# Patient Record
Sex: Male | Born: 1975 | Race: Black or African American | Hispanic: No | Marital: Married | Smoking: Never smoker
Health system: Southern US, Community
[De-identification: ages and names within clinical notes are randomized; demographics above are authoritative.]

## PROBLEM LIST (undated history)

## (undated) DIAGNOSIS — E119 Type 2 diabetes mellitus without complications: Secondary | ICD-10-CM

## (undated) DIAGNOSIS — D571 Sickle-cell disease without crisis: Secondary | ICD-10-CM

## (undated) HISTORY — PX: CHOLECYSTECTOMY: SHX55

---

## 1998-01-11 ENCOUNTER — Emergency Department (HOSPITAL_COMMUNITY): Admission: EM | Admit: 1998-01-11 | Discharge: 1998-01-11 | Payer: Self-pay | Admitting: Emergency Medicine

## 1998-02-20 ENCOUNTER — Inpatient Hospital Stay (HOSPITAL_COMMUNITY): Admission: EM | Admit: 1998-02-20 | Discharge: 1998-02-23 | Payer: Self-pay | Admitting: Family Medicine

## 1998-02-21 ENCOUNTER — Encounter: Payer: Self-pay | Admitting: Family Medicine

## 1999-02-24 ENCOUNTER — Emergency Department (HOSPITAL_COMMUNITY): Admission: EM | Admit: 1999-02-24 | Discharge: 1999-02-24 | Payer: Self-pay | Admitting: Emergency Medicine

## 1999-07-19 ENCOUNTER — Emergency Department (HOSPITAL_COMMUNITY): Admission: EM | Admit: 1999-07-19 | Discharge: 1999-07-19 | Payer: Self-pay | Admitting: Emergency Medicine

## 2000-04-02 ENCOUNTER — Inpatient Hospital Stay (HOSPITAL_COMMUNITY): Admission: AD | Admit: 2000-04-02 | Discharge: 2000-04-06 | Payer: Self-pay | Admitting: Family Medicine

## 2000-04-02 ENCOUNTER — Encounter: Payer: Self-pay | Admitting: Family Medicine

## 2000-04-04 ENCOUNTER — Encounter: Payer: Self-pay | Admitting: Family Medicine

## 2000-04-10 ENCOUNTER — Emergency Department (HOSPITAL_COMMUNITY): Admission: EM | Admit: 2000-04-10 | Discharge: 2000-04-10 | Payer: Self-pay | Admitting: Emergency Medicine

## 2001-02-11 ENCOUNTER — Inpatient Hospital Stay (HOSPITAL_COMMUNITY): Admission: EM | Admit: 2001-02-11 | Discharge: 2001-02-13 | Payer: Self-pay | Admitting: Emergency Medicine

## 2001-04-16 ENCOUNTER — Emergency Department (HOSPITAL_COMMUNITY): Admission: EM | Admit: 2001-04-16 | Discharge: 2001-04-16 | Payer: Self-pay | Admitting: Emergency Medicine

## 2001-10-20 ENCOUNTER — Encounter: Payer: Self-pay | Admitting: Emergency Medicine

## 2001-10-20 ENCOUNTER — Emergency Department (HOSPITAL_COMMUNITY): Admission: EM | Admit: 2001-10-20 | Discharge: 2001-10-21 | Payer: Self-pay | Admitting: Emergency Medicine

## 2001-11-17 ENCOUNTER — Inpatient Hospital Stay (HOSPITAL_COMMUNITY): Admission: EM | Admit: 2001-11-17 | Discharge: 2001-11-20 | Payer: Self-pay | Admitting: Emergency Medicine

## 2001-11-20 ENCOUNTER — Encounter: Payer: Self-pay | Admitting: Family Medicine

## 2001-11-29 ENCOUNTER — Emergency Department (HOSPITAL_COMMUNITY): Admission: EM | Admit: 2001-11-29 | Discharge: 2001-11-29 | Payer: Self-pay | Admitting: Emergency Medicine

## 2001-11-29 ENCOUNTER — Encounter: Payer: Self-pay | Admitting: Emergency Medicine

## 2002-12-14 ENCOUNTER — Emergency Department (HOSPITAL_COMMUNITY): Admission: EM | Admit: 2002-12-14 | Discharge: 2002-12-14 | Payer: Self-pay | Admitting: Emergency Medicine

## 2003-04-11 ENCOUNTER — Inpatient Hospital Stay (HOSPITAL_COMMUNITY): Admission: EM | Admit: 2003-04-11 | Discharge: 2003-04-16 | Payer: Self-pay | Admitting: Emergency Medicine

## 2003-08-31 ENCOUNTER — Emergency Department (HOSPITAL_COMMUNITY): Admission: EM | Admit: 2003-08-31 | Discharge: 2003-08-31 | Payer: Self-pay | Admitting: Emergency Medicine

## 2004-07-13 ENCOUNTER — Emergency Department (HOSPITAL_COMMUNITY): Admission: EM | Admit: 2004-07-13 | Discharge: 2004-07-13 | Payer: Self-pay | Admitting: Emergency Medicine

## 2004-08-09 ENCOUNTER — Observation Stay (HOSPITAL_COMMUNITY): Admission: EM | Admit: 2004-08-09 | Discharge: 2004-08-10 | Payer: Self-pay | Admitting: Emergency Medicine

## 2004-09-27 ENCOUNTER — Emergency Department (HOSPITAL_COMMUNITY): Admission: EM | Admit: 2004-09-27 | Discharge: 2004-09-27 | Payer: Self-pay | Admitting: Emergency Medicine

## 2004-12-10 ENCOUNTER — Inpatient Hospital Stay (HOSPITAL_COMMUNITY): Admission: EM | Admit: 2004-12-10 | Discharge: 2004-12-11 | Payer: Self-pay | Admitting: Emergency Medicine

## 2005-07-12 ENCOUNTER — Emergency Department (HOSPITAL_COMMUNITY): Admission: EM | Admit: 2005-07-12 | Discharge: 2005-07-12 | Payer: Self-pay | Admitting: Emergency Medicine

## 2005-12-20 ENCOUNTER — Emergency Department (HOSPITAL_COMMUNITY): Admission: EM | Admit: 2005-12-20 | Discharge: 2005-12-21 | Payer: Self-pay | Admitting: Emergency Medicine

## 2006-06-17 ENCOUNTER — Inpatient Hospital Stay (HOSPITAL_COMMUNITY): Admission: EM | Admit: 2006-06-17 | Discharge: 2006-06-18 | Payer: Self-pay | Admitting: Emergency Medicine

## 2007-06-01 ENCOUNTER — Emergency Department (HOSPITAL_COMMUNITY): Admission: EM | Admit: 2007-06-01 | Discharge: 2007-06-01 | Payer: Self-pay | Admitting: Emergency Medicine

## 2008-03-17 ENCOUNTER — Emergency Department (HOSPITAL_COMMUNITY): Admission: EM | Admit: 2008-03-17 | Discharge: 2008-03-17 | Payer: Self-pay | Admitting: Emergency Medicine

## 2008-06-05 ENCOUNTER — Emergency Department (HOSPITAL_COMMUNITY): Admission: EM | Admit: 2008-06-05 | Discharge: 2008-06-05 | Payer: Self-pay | Admitting: Emergency Medicine

## 2008-06-06 ENCOUNTER — Emergency Department (HOSPITAL_COMMUNITY): Admission: EM | Admit: 2008-06-06 | Discharge: 2008-06-06 | Payer: Self-pay | Admitting: Emergency Medicine

## 2008-12-23 ENCOUNTER — Emergency Department (HOSPITAL_COMMUNITY): Admission: EM | Admit: 2008-12-23 | Discharge: 2008-12-23 | Payer: Self-pay | Admitting: Emergency Medicine

## 2010-05-01 LAB — COMPREHENSIVE METABOLIC PANEL
Alkaline Phosphatase: 95 U/L (ref 39–117)
BUN: 8 mg/dL (ref 6–23)
Calcium: 9.7 mg/dL (ref 8.4–10.5)
Chloride: 103 mEq/L (ref 96–112)
GFR calc Af Amer: >60 mL/min (ref >60)
Glucose, Bld: 94 mg/dL (ref 70–99)
Total Bilirubin: 3.8 mg/dL — ABNORMAL HIGH (ref 0.3–1.2)

## 2010-05-01 LAB — URINALYSIS, ROUTINE W REFLEX MICROSCOPIC
Bilirubin Urine: NEGATIVE
Nitrite: NEGATIVE

## 2010-05-01 LAB — DIFFERENTIAL
Eosinophils Absolute: 0.2 10*3/uL (ref 0.0–0.7)
Eosinophils Relative: 1 % (ref 0–5)
Lymphs Abs: 4.4 10*3/uL — ABNORMAL HIGH (ref 0.7–4.0)
Monocytes Absolute: 1.9 10*3/uL — ABNORMAL HIGH (ref 0.1–1.0)
Monocytes Relative: 8 % (ref 3–12)
Neutro Abs: 16.7 10*3/uL — ABNORMAL HIGH (ref 1.7–7.7)
Neutrophils Relative %: 71 % (ref 43–77)

## 2010-05-01 LAB — CBC
MCHC: 34.2 g/dL (ref 30.0–36.0)
Platelets: 322 10*3/uL (ref 150–400)
RBC: 4.28 MIL/uL (ref 4.22–5.81)
RDW: 17.4 % — ABNORMAL HIGH (ref 11.5–15.5)

## 2010-05-01 LAB — RETICULOCYTES: Retic Ct Pct: 6.4 % — ABNORMAL HIGH (ref 0.4–3.1)

## 2010-05-07 LAB — URINALYSIS, ROUTINE W REFLEX MICROSCOPIC
Hgb urine dipstick: NEGATIVE
Ketones, ur: NEGATIVE mg/dL
Protein, ur: NEGATIVE mg/dL
Specific Gravity, Urine: 1.012 (ref 1.005–1.030)
pH: 6.5 (ref 5.0–8.0)

## 2010-05-07 LAB — POCT I-STAT, CHEM 8
BUN: 8 mg/dL (ref 6–23)
Creatinine, Ser: 0.8 mg/dL (ref 0.4–1.5)
HCT: 41 % (ref 39.0–52.0)
Hemoglobin: 13.9 g/dL (ref 13.0–17.0)
Sodium: 140 mEq/L (ref 135–145)

## 2010-05-07 LAB — DIFFERENTIAL
Basophils Absolute: 0 10*3/uL (ref 0.0–0.1)
Basophils Relative: 1 % (ref 0–1)
Eosinophils Absolute: 0.3 10*3/uL (ref 0.0–0.7)
Eosinophils Relative: 1 % (ref 0–5)
Eosinophils Relative: 1 % (ref 0–5)
Lymphocytes Relative: 4 % — ABNORMAL LOW (ref 12–46)
Lymphs Abs: 2.7 10*3/uL (ref 0.7–4.0)
Monocytes Relative: 1 % — ABNORMAL LOW (ref 3–12)
Monocytes Relative: 3 % (ref 3–12)
Neutro Abs: 20.5 10*3/uL — ABNORMAL HIGH (ref 1.7–7.7)
Neutro Abs: 23.3 10*3/uL — ABNORMAL HIGH (ref 1.7–7.7)
Neutrophils Relative %: 85 % — ABNORMAL HIGH (ref 43–77)

## 2010-05-07 LAB — RETICULOCYTES
RBC.: 4.09 MIL/uL — ABNORMAL LOW (ref 4.22–5.81)
RBC.: 4.29 MIL/uL (ref 4.22–5.81)
Retic Ct Pct: 6.3 % — ABNORMAL HIGH (ref 0.4–3.1)
Retic Ct Pct: 6.5 % — ABNORMAL HIGH (ref 0.4–3.1)

## 2010-05-07 LAB — COMPREHENSIVE METABOLIC PANEL
ALT: 35 U/L (ref 0–53)
Albumin: 4.1 g/dL (ref 3.5–5.2)
Alkaline Phosphatase: 148 U/L — ABNORMAL HIGH (ref 39–117)
BUN: 9 mg/dL (ref 6–23)
CO2: 21 mEq/L (ref 19–32)
Creatinine, Ser: 0.86 mg/dL (ref 0.4–1.5)
Sodium: 137 mEq/L (ref 135–145)
Total Bilirubin: 6 mg/dL — ABNORMAL HIGH (ref 0.3–1.2)
Total Protein: 7.4 g/dL (ref 6.0–8.3)

## 2010-05-07 LAB — CBC
HCT: 37.5 % — ABNORMAL LOW (ref 39.0–52.0)
HCT: 40.2 % (ref 39.0–52.0)
MCHC: 34.6 g/dL (ref 30.0–36.0)
MCHC: 34.7 g/dL (ref 30.0–36.0)
MCV: 90.4 fL (ref 78.0–100.0)
MCV: 90.9 fL (ref 78.0–100.0)
Platelets: 305 10*3/uL (ref 150–400)
RBC: 4.45 MIL/uL (ref 4.22–5.81)
WBC: 25.2 10*3/uL — ABNORMAL HIGH (ref 4.0–10.5)

## 2010-06-11 NOTE — Discharge Summary (Signed)
[name], [name]          ACCOUNT NO.:  [id number]   MEDICAL RECORD NO.:  [id number]          PATIENT TYPE:  INP   LOCATION:  1339                         FACILITY:  [facility]   PHYSICIAN:  Lonia Blood, M.D.       DATE OF BIRTH:  1975-12-22   DATE OF ADMISSION:  06/17/2006  DATE OF DISCHARGE:  06/18/2006                               DISCHARGE SUMMARY   PRIMARY CARE PHYSICIAN:  Lorelle Formosa, M.D.   DISCHARGE DIAGNOSES:  1. Sickle cell crisis.  2. Sickle cell anemia.  3. Leukocytosis related to sickle cell crisis.   DISCHARGE MEDICATIONS:  1. OxyContin 40 mg by mouth twice a day.  2. Percocet 5/325 by mouth every 4 hours as needed for pain.  3. Ibuprofen 400 mg by mouth three times a day.   CONDITION ON DISCHARGE:  Mr. Gregory Bryant was discharged in good  condition.  At the time of discharge, the patient was without any pain.  The patient was able to tolerate a regular diet.  His leukocytosis has  been decreasing under supervision here in the hospital from 27,000-  20,000.  The patient has remained afebrile at all times.   PROCEDURE DURING THIS ADMISSION:  No procedures were done.   CONSULTATIONS DURING THIS ADMISSION:  No consultations were obtained.   HISTORY AND PHYSICAL:  For admission history and physical, refer to  dictated H&P that was done by Dr. Ellie Lunch.   HOSPITAL COURSE:  1. Sickle cell crisis.  [name] was admitted to [facility] with a diagnosis of lower extremity pain, back      pain and decrease in hemoglobin.  Also, the patient had      leukocytosis.  The patient was placed on intravenous fluids and      analgesics.  He had a course of quick recovery, and by hospital day      #2. the patient was able to tolerate a regular diet and had his      pain controlled with oral medications only.  The patient's      hemoglobin has remained stable at 11.8, and his leukocytosis has      been improving.  As [name] did  not develop a fever, we do      not expect that an infectious process was the cause of his sickle      cell      crisis.  Upon taking a good history, we found out that Mr.      [name] has been working hard in a sunny condition, and he may      have gotten dehydrated.  I have provided the patient with education      in regards to the importance of staying hydrated and not to      overexert himself.      Lonia Blood, M.D.  Electronically Signed     SL/MEDQ  D:  06/18/2006  T:  06/18/2006  Job:  [id number]   cc:   Lorelle Formosa, M.D.  Fax: [phone]

## 2010-06-11 NOTE — H&P (Signed)
NAMEGERELL, FORTSON          ACCOUNT NO.:  [id number]   MEDICAL RECORD NO.:  [id number]          PATIENT TYPE:  INP   LOCATION:  1339                         FACILITY:  [facility]   PHYSICIAN:  Ellie Lunch, M.D.      DATE OF BIRTH:  04-07-1975   DATE OF ADMISSION:  06/17/2006  DATE OF DISCHARGE:                              HISTORY & PHYSICAL   PRIMARY CARE PHYSICIAN:  Lorelle Formosa, M.D.   REASON FOR ADMISSION:  Sickle cell pain crisis and leukocytosis.   Mr. Schnick is a very pleasant 35 year old African American male with  a history of sickle cell anemia with most recent pain crisis in June  2007 that comes to the emergency room with sudden onset of 10/10 pain in  the lower back and bilateral lower extremity that started this morning.  The patient does not complain of any fevers, chills, shortness of  breath, wheezing, chest pain, nausea, vomiting, diaphoresis or loss of  consciousness.  The patient has similar episode in June 2007 that  resolved in about couple of days. Note the patient did continued to go  to work until this morning since he was feeling better until the pain  started.  The patient's sickle cell disease is currently being managed  by his PCP. Note he is not on hydroxyurea since the patient does not  want to be on hydroxyurea and his most recent crisis was in June 2007.   PAST MEDICAL HISTORY:  Sickle cell anemia with most recent pain crisis  in June of 2007.   MEDICATIONS:  1. OxyContin 40 mg p.o. q.12 hours.  2. Percocet 1-2 pills p.o. q.6 hours p.r.n. breakthrough pain.  The      patient not taking any folate currently.   ALLERGIES:  Patient is allergic to ASPIRIN and SULFA.  He is not aware  of exactly what happens when he takes these but he has been told he is  allergic to these medicines since he was a child.   SOCIAL HISTORY:  The patient works as a Administrator.  He does not give  any history of smoking, alcohol or drug use.   REVIEW OF  SYSTEMS:  As per HPI.   PHYSICAL EXAMINATION:  VITAL SIGNS: Temperature 98, blood pressure  143/69, pulse 101, respiration 28, O2 sat 100% on room air.  GENERAL:  The patient is in no acute distress.  HEENT:  Atraumatic, normocephalic.  Pupils equal, round, react to light  and accommodation.  There is no pallor.  Oropharynx is clear.  NECK:  Supple.  CARDIOVASCULAR: Regular rate and rhythm, no murmurs.  RESPIRATORY: Clear to auscultation bilaterally, good air movement.  No  rales, rhonchi or wheezing.  ABDOMEN: Soft, nontender, nondistended.  Positive bowel sounds.  No  splenomegaly.  EXTREMITIES: No clubbing, cyanosis or edema.   LABORATORY DATA:  Sodium 140, potassium 3.8, chloride 107, bicarb 21,  glucose 131, BUN 10 and creatinine 0.97. White count 27, hemoglobin  12.9, platelet count 390. UA was negative, reticulocyte count 7%,  absolute reticulocyte count of 300.   Chest x-ray; no acute cardiopulmonary disease and stable compared to  prior exam.   ASSESSMENT/PLAN:  1. Sickle cell pain crisis.  This was very similar to the patient's      episode in June 2007 at which time the patient's white count was      elevated at 32,000 but he did not have any evidence of infection.      Currently the patient does not give any signs or symptoms of      infection.  His chest x-ray and urinalysis are both negative. At      this point I will treat his pain crisis with IV fluids, pain      control.  We will restart the folate and the patient has been told      that he should continue the folate after discharge. Also since the      patient is not aware of when he was last immunized against      pneumococcal and H.  Influenzae will go ahead and administer these      vaccines while he is in the hospital.  2. Leukocytosis most likely seems to be secondary to sickle cell pain      crisis.  This is similar to this admission in June 2007 at which      time his white count of 32,000. At this  admission the patient's      white count is 27,000. Will go ahead and get blood cultures and      urine cultures, however, given that the patient does not have any      signs and symptoms of infection.  Will go ahead and observe him and      follow up with his white count with hydration and resolution of      pain crisis. However, the patient would have any evidence of      infection would have very low threshold to start him on      antibiotics.      Ellie Lunch, M.D.  Electronically Signed     BP/MEDQ  D:  06/17/2006  T:  06/17/2006  Job:  [id number]   cc:   Lorelle Formosa, M.D.  Fax: [phone]

## 2010-06-14 NOTE — Discharge Summary (Signed)
[facility]  Patient:    Gregory Bryant, Gregory Bryant                   MRN: [id number] Adm. Date:  04/02/00 Disc. Date: 04/06/00 Attending:  Lorelle Formosa, M.D.                           Discharge Summary  ADMISSION DIAGNOSES: 1. Sickle cell vaso-occlusive crisis. 2. Pyrexia, rule out pneumonia, sepsis, acute chest syndrome.  DISCHARGE DIAGNOSES: 1. Pneumonitis. 2. Sickle cell disease with vaso-occlusive crisis.  CONDITION ON DISCHARGE:  Stable.  DISPOSITION:  Follow up with me in approximately one week.  DISCHARGE MEDICATIONS: 1. OxyContin CR 20 mg b.i.d. p.r.n. pain. 2. OxyIR 5 mg one or two q.i.d. p.r.n. breakthrough pain. 3. Ibuprofen 800 mg t.i.d. 4. Tequin 400 mg daily for five days. 5. Rondek DM cough syrup one teaspoon q.i.d. p.r.n. cough and congestion.  HISTORY OF PRESENT ILLNESS:  This patient is a 35 year old single black male who presented to my office on the day of admission via private vehicle with temperature of 103 approximately.  He reported to have had temperature of 103 axillary at home during the week, but did not choose to come to the office for evaluation.  Family called oncall physician the night prior to admission and Guaifenesin 800 mg b.i.d. and promethazine with Codeine q.6h. was ordered for the patient.  He also stated he used ibuprofen 600 mg four times a day.  He had missed several days from work due to his illness.  He denied headache, nausea, vomiting, diarrhea, or episodes of confusion.  Thus he was sent to hospital for further evaluation.  PAST MEDICAL HISTORY:  The patient has had several hospitalizations for sickle cell vaso-occlusive crisis and pneumonia.  He has not had any operations.  ALLERGIES:  No known drug allergies.  MEDICATIONS:  Listed in present illness.  Last hospitalization was January of 2000.  FAMILY HISTORY:  The patients mother is 97 and has non-insulin-dependent diabetes mellitus  and hypertension.  Father is 35 alive and well.  He has one child four years of age who is alive and well.  Two siblings are alive and well.  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  [place].  PERSONAL HISTORY:  The patient does not smoke, use any tobacco products, alcohol, drugs, or pipe.  PHYSICAL EXAMINATION:  VITAL SIGNS:  Blood pressure 91/51, pulse 72, respirations 20, temperature 103.  GENERAL: The patient was alert and oriented black male lying supine in bed.  He appeared ill and had coughing episodes. HEENT: Within normal limits with only mild scleral icteris.  Mouth was normal with normal dentition and mucosa.  Mucous membranes were moist.  NECK: Supple. No jugular venous distension, bruits, nodes, or thyromegaly.  CHEST: Clear to auscultation.  He had slight dyspnea.  HEART: Regular rate and rhythm without murmur.  ABDOMEN: Soft and flat.  EXTREMITIES: Normal.  NEUROLOGICAL: Grossly physiologic.  GENITOURINARY: Bilateral descended testes and circumcised penis.  LABORATORY DATA:  CBC; white count 16,000, hemoglobin 12.4.  CMET basically normal except glucose 125.  Reticulocyte count was 4.7.  Chest x-ray revealed no active disease.  Urinalysis was normal with specific gravity 1.011, pH 7.0, pulse oximetry 99% on room air.  HOSPITAL COURSE:  The patient was  admitted to the hospital and given Humibid LA 600 mg b.i.d., Robitussin DM 10 cc q.6h., folate 1 mg daily, ibuprofen 800 mg q.8h., Dilaudid 2 mg with Phenergan 12.5 mg IV q.12h. p.r.n. pain.  He had blood and urine cultures done.  He was provided IV fluids with D5-1/2 normal saline at 200 cc per hour for 1 liter and was then decreased to 150 cc per hour.  The patient improved and had reduced coughing paroxysms with slight sputum production.  He became afebrile and had  good pulse oximetries on room air.  He is thus ambulatory and has good appetite.  He is being discharged for outpatient management. DD:  04/06/00 TD:  04/07/00 Job: [id number]

## 2010-06-14 NOTE — Discharge Summary (Signed)
Gregory Bryant, Gregory Bryant                      ACCOUNT NO.:  [id number]   MEDICAL RECORD NO.:  [id number]                   PATIENT TYPE:  INP   LOCATION:  0255                                 FACILITY:  [facility]   PHYSICIAN:  Lorelle Formosa, M.D.           DATE OF BIRTH:  07-May-1975   DATE OF ADMISSION:  11/17/2001  DATE OF DISCHARGE:  11/20/2001                                 DISCHARGE SUMMARY   ADMISSION DIAGNOSIS:  Sickle cell vaso-occlusive crisis.   DISCHARGE DIAGNOSIS:  Sickle cell vaso-occlusive crisis.   DISCHARGE MEDICATIONS:  Tequin 400 mg q.d. to repeat treatment course, this  will be seven days.   HISTORY AND PHYSICAL:  This patient is a 35 year old married black male who  was admitted with severe pain on his legs and back consistent with sickle  cell vaso-occlusive crisis.  The patient began hurting severely about  midnight, and did not get relief with his home medications, thus, he  presented to the emergency room and was admitted.   HOME MEDICATIONS:  1. OxyContin 20 mg q.d.  2. Oxy IR 5 mg q.i.d. p.r.n. breakthrough pain.   He did drink extra liquids, and was able to work the day prior.  He works as  a Primary school teacher, Psychiatrist, Catering manager.   His past medical history, family history, personal history, review of  systems are per admission note.   ALLERGIES:  1. ASPIRIN.  2. SULFA DRUGS.   PHYSICAL EXAMINATION:  VITAL SIGNS:  Blood pressure 157/94, pulse 76,  respirations 20, temperature 98.8.  GENERAL:  The patient was alert and oriented and was laying supine in bed.  HEENT:  Pupils equal, round, reactive to light and accommodation.  Extraocular movements were intact.  NECK:  Supple.  CHEST:  Clear to auscultation.  HEART:  Regular rate and rhythm without murmur.  ABDOMEN:  Soft and flat, bowel sounds were normal.  EXTREMITIES:  Normal.  NEUROLOGIC:  Grossly phsyiologic.  SKIN:  No lesions.  He did have slightly moist skin to  palpation.   LABORATORY DATA:  CBC revealed a white blood cell count of 25,000,  hemoglobin 12.6, hematocrit 36.6, platelets 33,000.  Red blood cell  morphology:  Marked polychromasia, target cells, Howell-Jolly bodies, red  nucleated red blood cells.  White blood cell morphology revealed atypical  lymphocytes.  Repeat CBC on 11/20/01, revealed white blood cell count of  14.5, hemoglobin 11.8, and hematocrit 34.4.  Reticulocyte count was 6.2.  Chemistries were remarkable for a glucose of 131 and 122, with slight  elevation of AST to 62, ALP 59, and bilirubin 4.2.  Blood cultures were  negative.  Chest x-ray revealed stable mild chronic interstitial lung  disease.   HOSPITAL COURSE:  The patient was treated with intravenous fluids.  He had  worsening pain and was monitored.  He was given Tequin because of the  possibility of bronchitis with pneumonia.  He will be followed up as an  outpatient.                                                  Lorelle Formosa, M.D.    WWM/MEDQ  D:  01/13/2002  T:  01/14/2002  Job:  [id number]

## 2010-06-14 NOTE — Discharge Summary (Signed)
Gregory Bryant, Gregory Bryant          ACCOUNT NO.:  [id number]   MEDICAL RECORD NO.:  [id number]          PATIENT TYPE:  INP   LOCATION:  0108                         FACILITY:  [facility]   PHYSICIAN:  Lonia Blood, M.D.       DATE OF BIRTH:  1976-01-22   DATE OF ADMISSION:  12/10/2004  DATE OF DISCHARGE:  12/11/2004                                 DISCHARGE SUMMARY   PRIMARY CARE PHYSICIAN:  Lorelle Formosa, M.D.   DISCHARGE DIAGNOSIS:  Sickle cell crisis.   DISCHARGE MEDICATIONS:  1.  OxyContin 40 mg p.o. q.10 h.  2.  Oxycodone 5 mg p.o. q.6 h. p.r.n. pain.  3.  Ibuprofen 400 mg p.o. 3 times a day for 3 days.   PROCEDURES:  No procedures were done.   CONSULTATIONS:  No consultations were obtained.   CONDITION ON DISCHARGE:  The patient was discharged in great condition. At  time of discharge, he had some minimal leg pain but no back pain and no  chest pain.  At the time of discharge, his hemoglobin has been stable.  Oxygen has been stable, and vital signs have been stable.   The patient was instructed to follow up with Dr. Billee Cashing in his  office and also to follow up at [facility] as before.   HISTORY OF PRESENT ILLNESS:  For complete HPI, please refer to dictated  History and Physical done by Dr. Sherrie Mustache on December 10, 2004.  Briefly, [name] is a 35 year old man with history of sickle cell disease who  presented to [facility] Emergency Room with a 2-day history of acute back  pain, upper and lower leg pain, and chest pain.  He was found in the  emergency room to have a leukocytosis and was admitted for management.   HOSPITAL COURSE:  #1.  SICKLE CELL CRISIS:  The patient was admitted to the hospital.  He was  placed on intravenous fluids, and he was given intravenous Dilaudid.  He was  continued on his OxyContin dose as well as started on ibuprofen.  The  patient's hemoglobin remained stable, dropped to a level of 12.  By hospital  day  #2, the patient was without pain.  He remained afebrile throughout this  hospitalization.  He denies any chest pain or coughing, dysuria, or  abdominal pain. It was felt the problem of this crisis was brought on by  dehydration and over exhaustion from work.  The  patient was hydrated with intravenous fluids, and his white blood cell count  decreased.  At the time of discharge, Gregory Bryant was instructed to rest  for 4 days, drink plenty of fluids, and follow up with his primary care  physician.      Lonia Blood, M.D.  Electronically Signed     SL/MEDQ  D:  12/11/2004  T:  12/11/2004  Job:  [id number]   cc:   Lorelle Formosa, M.D.  Fax: [phone]

## 2010-06-14 NOTE — H&P (Signed)
Gregory Bryant, Gregory Bryant          ACCOUNT NO.:  [id number]   MEDICAL RECORD NO.:  [id number]          PATIENT TYPE:  INP   LOCATION:  0108                         FACILITY:  [facility]   PHYSICIAN:  Elliot Cousin, M.D.    DATE OF BIRTH:  03/03/1975   DATE OF ADMISSION:  12/10/2004  DATE OF DISCHARGE:                                HISTORY & PHYSICAL   PRIMARY CARE PHYSICIAN:  Lorelle Formosa, M.D.   CHIEF COMPLAINT:  Back pain, bilateral leg pain, and chest pain.   HISTORY OF PRESENT ILLNESS:  The patient is a 35 year old man with a past  medical history significant for sickle cell disease, who presents to the  emergency department with a two day history of acute back pain, upper and  lower back, bilateral leg pain, and chest pain.  The patient had been taking  his OxyContin and oxycodone on a regular basis; however, over the past 24-48  hours, they did not help to relieve the pain.  The pain is described as  aching.  At its worst, it is rated as a 7/10 in intensity.  It has been  difficult for the patient to walk secondary to pain in his legs over the  past 24 hours.  The patient denies shortness of breath, cough, pleurisy,  upper respiratory infection symptoms, abdominal pain, nausea, vomiting,  diarrhea, and painful urination.  He denies any acute joint swelling.  No  noticeable redness of his extremities.  He has noticed that his sclerae have  been discolored.  The patient has also had no subjective fevers or chills.   During the evaluation in the emergency department, the patient was noted to  be afebrile and hemodynamically stable.  His chest x-ray revealed cardiac  enlargement with vascular congestion and no acute pneumonia or CHF.  His  white count was elevated at 23.7.  His reticulocyte count was elevated at  256.5.  The patient will be admitted for further evaluation and management.   PAST MEDICAL HISTORY:  1.  Sickle cell disease.  2.  ASPIRIN and SULFA allergies.   MEDICATIONS:  1.  OxyContin 40 mg b.i.d.  2.  Oxycodone 5 mg 1-2 tablets every 4 hours as needed for pain.   ALLERGIES:  Patient has an allergy to ASPIRIN as well as SULFA drugs.   SOCIAL HISTORY:  The patient is single.  He lives in [place].  He has one  daughter.  He is employed as a Administrator.  He denies alcohol, tobacco, or  drug use.  He completed the 12th grade.   FAMILY HISTORY:  His mother is 68 years of age, and she has a history of  hypertension, diabetes mellitus, and sickle cell trait.  His father is  unknown.   REVIEW OF SYSTEMS:  The patient's review of systems is otherwise negative.   PHYSICAL EXAMINATION:  VITAL SIGNS:  Temperature 97.1, blood pressure  137/75, pulse 84, respiratory rate 18, oxygen saturation 98% on 2 liters.  GENERAL:  The patient is a pleasant 35 year old African-American male  currently sitting up in bed in mild distress.  The patient has received  several milligrams of Dilaudid.  HEENT:  Head is normocephalic and atraumatic.  Pupils are equal, round and  reactive to light.  Extraocular movements are intact.  Conjunctivae are  clear.  Sclerae are mildly icteric.  Tympanic membranes are clear  bilaterally.  Nasal mucosa is mildly dry.  No sinus tenderness.  Oropharynx  reveals good dentition.  Mucous membranes are mildly dry.  No posterior  exudate or erythema.  NECK:  Supple.  No adenopathy.  No thyromegaly.  No bruits.  No JVD .  LUNGS:  Clear to auscultation bilaterally.  HEART:  S1 and S2 with no murmurs, rubs or gallops.  ABDOMEN:  Positive bowel sounds.  Soft, nontender, nondistended.  No  hepatosplenomegaly.  No masses palpated.  RECTAL/GU:  Deferred.  EXTREMITIES/MUSCULOSKELETAL:  The patient's pedal pulses are 2+ bilaterally.  He has mild-to-moderate tenderness over the lumbosacral muscles as well as  his legs diffusely and bilaterally.  No tenderness upon palpation of his  chest wall.  No acute joint erythema or edema.  The  patient's range of  motion of his lower extremities bilaterally is somewhat limited secondary to  pain (with regards to hip adduction, abduction, flexion and extension).  NEUROLOGIC:  The patient is alert and oriented x3.  Cranial nerves II-XII  are intact.  Sensation is intact.  Strength is 5/5 throughout with the  exception of his lower extremities, which are mildly decreased in strength  secondary to discomfort.   INITIAL LABORATORIES:  Chest x-ray reveals cardiac enlargement with vascular  congestion.  No acute pneumonia or CHF.   Urinalysis is negative.  Reticulocyte percentage is 5.6 (0.4 to 3.1 within  normal limits).  Absolute reticulocyte count is 256.5 (19 to 186 within  normal limits).  Sodium 132, potassium 3.5, chloride 98, CO2 23, glucose  107, BUN 8, creatinine 0.9, total bilirubin 3.7, alkaline phosphatase 88,  SGOT 48, SGPT 34, total protein 7.8, albumin 4.4, calcium 9.8.  WBC 23.7,  absolute neutrophil count 20.4, hemoglobin 13.8, hematocrit 40.8, platelets  380.   ASSESSMENT:  1.  Acute back pain, bilateral leg pain, and chest pain:  Per patient, his      sickle cell crisis usually causes pain in his back, legs, and chest.      His symptoms are therefore secondary to sickle cell pain crisis.  His      reticulocyte count is elevated, which is also consistent with sickle      cell pain crisis.  2.  Leukocytosis:  The leukocytosis is probably secondary to the sickle cell      crisis.  No overt evidence of infection at this time.  The patient is      currently afebrile.  There is, however, a low threshold for starting      antibiotic therapy if the patient becomes even marginally febrile.  3.  Mildly elevated liver function tests (hyperbilirubinemia and elevated      SGOT):  The elevated SGOT and bilirubin are secondary to sickle cell      disease.  The patient has scleral icterus on exam.  No acute abdominal     findings on exam.   PLAN:  1.  Will admit the patient  to a regular bed.  2.  Volume repletion with normal saline and potassium chloride added.  3.  Pain management with Dilaudid intravenously as needed and OxyContin 40      mg p.o. b.i.d.  4.  Will add ibuprofen t.i.d.  5.  Will start  GI prophylaxis with Protonix.  6.  Follow LFTs.  7.  Follow electrolytes, particularly potassium.  Will give one dose of      potassium now for borderline hypokalemia.  8.  Will check an EKG.      Elliot Cousin, M.D.  Electronically Signed     DF/MEDQ  D:  12/10/2004  T:  12/10/2004  Job:  [id number]   cc:   Lorelle Formosa, M.D.  Fax: [phone]

## 2010-06-14 NOTE — Discharge Summary (Signed)
Gregory Bryant, Gregory Bryant                      ACCOUNT NO.:  [id number]   MEDICAL RECORD NO.:  [id number]                   PATIENT TYPE:  INP   LOCATION:  0258                                 FACILITY:  [facility]   PHYSICIAN:  Lorelle Formosa, M.D.           DATE OF BIRTH:  May 09, 1975   DATE OF ADMISSION:  04/11/2003  DATE OF DISCHARGE:  04/16/2003                                 DISCHARGE SUMMARY   ADMISSION DIAGNOSES:  1. Sickle cell vasoocclusive crisis.  2. Gastroenteritis.   DISCHARGE DIAGNOSES:  1. Sickle cell vasoocclusive crisis.  2. Gastroenteritis.   DISCHARGE CONDITION:  Stable.   DISCHARGE DISPOSITION:  Follow up in my office in approximately one to two  weeks.   DISCHARGE MEDICATIONS:  1. OxyContin CR 20 mg b.i.d.  2. OxyIR 5 mg q.i.d. p.r.n. breakthrough pain.   HISTORY:  This patient is a 35 year old single black male who has known  sickle cell disease.  He presented with the complaint of hurting generally  and with recurrent nausea and vomiting.  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  He was given a  prescription initially for a renewal of his narcotics and Phenergan.  However, before they could be filled, he became sick and was brought to the  emergency room by his mom.  He was initially seen by the emergency room  physician, Dr. Beverely Pace, and abdominal x-ray series were done which were not  remarkable.  Labs revealed white count 17,000, hemoglobin 13.8, reticulocyte  count 7.6% with sodium 135, potassium 3.3 and other parameters basically  normal.  He was thus admitted for further treatment.   PHYSICAL EXAMINATION:  GENERAL:  The patient with moderate scleral icterus  and basically healthy-appearing black male was in moderate discomfort.  VITAL SIGNS:  Blood pressure 134/70, pulse 120 with respirations of 20s,  temperature 101.6 orally and pulse oximetry 90% on room air.  HEENT:  Mouth exam revealed  tongue and uvula midline with moist mucous  membranes.  NECK:  Supple without jugular venous distention, bruits, nodes, or  thyromegaly.  CHEST:  Clear to auscultation.  HEART:  Regular rhythm and rate without murmur.  ABDOMEN:  Soft and flat.  Bowel sounds were active.  EXTREMITIES:  Normal.  SKIN:  Hypothermia with no rashes.  NEUROLOGICAL:  He was ambulatory with a slow particulate gait.   LABORATORY DATA:  CBC revealed white count of 17,000, hemoglobin 13.8,  hematocrit 39.9.  White blood cell morphology revealed increased bands with  78% neutrophils, few lymphocytes, and 17% monocytes, 2% basophils.  Red  blood cell morphology revealed target cells, Howell-Jolly bodies, sickle  cells, 2 to 5% schistocytes, and rare nucleated red blood cells.  Reticulocyte percentage was 7.6%.  Urinalysis was basically normal except  for with a pH of 8.5, ketones 15, urobilinogen of 1.  On repeat the next  day, pH was at 6  with ketones resolved.  Blood and urine cultures had no  growth.  Acute abdomen revealed stable mild cardiomegaly and normal bowel  gas pattern.   HOSPITAL COURSE:  The patient was treated with IV fluids consisting of  normal saline which was a 1-liter bolus given in the emergency room.  He was  continued with D-5 one-half normal saline plus 12 mEq of KCl given at 150 mL  per hour and given K-Dur 20 mEq q.4h. to correct his hypokalemia.  He was  given Phenergan and Imodium for nausea, vomiting, and diarrhea.  He was  given intravenous Avelox 400 mg daily pending cultures.  He gradually  improved and was able to be transitioned to oral medications and discharged  for outpatient management.                                               Lorelle Formosa, M.D.    WWM/MEDQ  D:  05/11/2003  T:  05/11/2003  Job:  [id number]

## 2012-01-25 ENCOUNTER — Emergency Department (HOSPITAL_COMMUNITY)
Admission: EM | Admit: 2012-01-25 | Discharge: 2012-01-26 | Disposition: A | Discharge status: Home / Self Care | Payer: Medicaid Other | Attending: Emergency Medicine | Admitting: Emergency Medicine

## 2012-01-25 ENCOUNTER — Encounter (HOSPITAL_COMMUNITY): Payer: Self-pay | Admitting: *Deleted

## 2012-01-25 ENCOUNTER — Emergency Department (HOSPITAL_COMMUNITY): Payer: Medicaid Other

## 2012-01-25 DIAGNOSIS — R6883 Chills (without fever): Secondary | ICD-10-CM | POA: Insufficient documentation

## 2012-01-25 DIAGNOSIS — R059 Cough, unspecified: Secondary | ICD-10-CM | POA: Insufficient documentation

## 2012-01-25 DIAGNOSIS — R05 Cough: Secondary | ICD-10-CM

## 2012-01-25 HISTORY — DX: Sickle-cell disease without crisis: D57.1

## 2012-01-25 MED ORDER — AZITHROMYCIN 250 MG PO TABS: 250.0000 mg | ORAL_TABLET | Freq: Every day | ORAL | Status: DC

## 2012-01-25 NOTE — ED Notes (Signed)
The pt had a cough and a cold for 2 weeks.  Productive cough.  He may have had a temp hes not sure.  Chest congestion

## 2012-01-26 NOTE — ED Provider Notes (Signed)
History     CSN: [id number]  Arrival date & time 01/25/12  2253   First MD Initiated Contact with Patient 01/25/12 2340      Chief Complaint  Patient presents with  . Cough     Patient is a 36 y.o. male presenting with cough. The history is provided by the patient.  Cough This is a new problem. The current episode started more than 1 week ago. The problem occurs hourly. The problem has been gradually worsening. The cough is productive of sputum. Associated symptoms include chills. Pertinent negatives include no shortness of breath. Treatments tried: rest. The treatment provided mild relief.  pt reports cough for over a week He reports chills No SOB.  Reports chest wall pain only with cough No vomiting/diarrhea He has no other complaints  Past Medical History  Diagnosis Date  . Sickle cell anemia     History reviewed. No pertinent past surgical history.  No family history on file.  History  Substance Use Topics  . Smoking status: Never Smoker   . Smokeless tobacco: Not on file  . Alcohol Use: No      Review of Systems  Constitutional: Positive for chills.  Respiratory: Positive for cough. Negative for shortness of breath.   Gastrointestinal: Negative for vomiting.  Musculoskeletal: Negative for back pain.  Neurological: Negative for weakness.  Psychiatric/Behavioral: Negative for agitation.  All other systems reviewed and are negative.    Allergies  Aspirin and Sulfa antibiotics  Home Medications   Current Outpatient Rx  Name  Route  Sig  Dispense  Refill  . AZITHROMYCIN 250 MG PO TABS   Oral   Take 1 tablet (250 mg total) by mouth daily. Take first 2 tablets together, then 1 every day until finished.   6 tablet   0     BP 154/75  Pulse 91  Temp 99.5 F (37.5 C) (Oral)  Resp 18  SpO2 96%  Physical Exam CONSTITUTIONAL: Well developed/well nourished HEAD AND FACE: Normocephalic/atraumatic EYES: EOMI/PERRL ENMT: Mucous membranes moist NECK:  supple no meningeal signs SPINE:entire spine nontender CV: S1/S2 noted, no murmurs/rubs/gallops noted LUNGS: Lungs are clear to auscultation bilaterally, no apparent distress ABDOMEN: soft, nontender, no rebound or guarding GU:no cva tenderness NEURO: Pt is awake/alert, moves all extremitiesx4 EXTREMITIES: pulses normal, full ROM SKIN: warm, color normal PSYCH: no abnormalities of mood noted  ED Course  Procedures   Labs Reviewed - No data to display Dg Chest 2 View  01/25/2012  *RADIOLOGY REPORT*  Clinical Data: Cough.  Chest pain.  Sickle cell disease.  CHEST - 2 VIEW  Comparison: None.  Findings: Heart size is normal.  Both lungs are clear.  No evidence of pneumothorax or pleural effusion.  No mass or lymphadenopathy identified.  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  IMPRESSION: Stable exam.  No active cardiopulmonary disease.   Original Report Authenticated By: Myles Rosenthal, M.D.      1. Cough     Pt well appearing, CXR unremarkable.  He does have h/o [place] disease but denies any pain complaints.  He denies SOB or significant CP Stable for d/c.  We discussed strict return precautions.  I also started zpack given cough >1 week and h/o sickle cell disease    MDM  Nursing notes including past medical history and social history reviewed and considered in documentation xrays reviewed and considered         Joya Gaskins, MD 01/26/12 [phone]

## 2013-04-27 ENCOUNTER — Encounter (HOSPITAL_COMMUNITY): Payer: Self-pay | Admitting: Emergency Medicine

## 2013-04-27 ENCOUNTER — Emergency Department (HOSPITAL_COMMUNITY): Payer: Medicaid Other

## 2013-04-27 ENCOUNTER — Emergency Department (HOSPITAL_COMMUNITY)
Admission: EM | Admit: 2013-04-27 | Discharge: 2013-04-27 | Disposition: A | Discharge status: Home / Self Care | Payer: Medicaid Other | Attending: Emergency Medicine | Admitting: Emergency Medicine

## 2013-04-27 DIAGNOSIS — R1110 Vomiting, unspecified: Secondary | ICD-10-CM | POA: Insufficient documentation

## 2013-04-27 DIAGNOSIS — Z862 Personal history of diseases of the blood and blood-forming organs and certain disorders involving the immune mechanism: Secondary | ICD-10-CM | POA: Insufficient documentation

## 2013-04-27 DIAGNOSIS — J069 Acute upper respiratory infection, unspecified: Secondary | ICD-10-CM | POA: Insufficient documentation

## 2013-04-27 MED ORDER — CETIRIZINE-PSEUDOEPHEDRINE ER 5-120 MG PO TB12: 1.0000 | ORAL_TABLET | Freq: Two times a day (BID) | ORAL | Status: DC

## 2013-04-27 MED ORDER — ALBUTEROL SULFATE HFA 108 (90 BASE) MCG/ACT IN AERS: 1.0000 | INHALATION_SPRAY | Freq: Four times a day (QID) | RESPIRATORY_TRACT | Status: DC | PRN

## 2013-04-27 MED ORDER — GUAIFENESIN ER 600 MG PO TB12: 600.0000 mg | ORAL_TABLET | Freq: Two times a day (BID) | ORAL | Status: DC

## 2013-04-27 NOTE — ED Notes (Signed)
Per pt report: pt c/o vomiting that began around 16:00 today.  Pt vomited 2 times today.  Pt also states that when he blew his nose today, pt had blood in his mucus. Pt reports coughing that makes it hard for him to catch his breath.  NAD noted.  Pt a/o x 4.  Ambulatory in triage.  Skin warm and dry.

## 2013-04-27 NOTE — Discharge Instructions (Signed)
You were seen and evaluated for your cough and congestion symptoms. Your x-ray did not show any signs of pneumonia today. There were signs of an upper respiratory viral infection and possible bronchitis. Please use the medications prescribed to help with her symptoms. Watch for any fever. Followup with your doctor for same tomorrow for continued evaluation and treatment. Return at any time for changing or worsening symptoms.    Upper Respiratory Infection, Adult An upper respiratory infection (URI) is also sometimes known as the common cold. The upper respiratory tract includes the nose, sinuses, throat, trachea, and bronchi. Bronchi are the airways leading to the lungs. Most people improve within 1 week, but symptoms can last up to 2 weeks. A residual cough may last even longer.  CAUSES Many different viruses can infect the tissues lining the upper respiratory tract. The tissues become irritated and inflamed and often become very moist. Mucus production is also common. A cold is contagious. You can easily spread the virus to others by oral contact. This includes kissing, sharing a glass, coughing, or sneezing. Touching your mouth or nose and then touching a surface, which is then touched by another person, can also spread the virus. SYMPTOMS  Symptoms typically develop 1 to 3 days after you come in contact with a cold virus. Symptoms vary from person to person. They may include:  Runny nose.  Sneezing.  Nasal congestion.  Sinus irritation.  Sore throat.  Loss of voice (laryngitis).  Cough.  Fatigue.  Muscle aches.  Loss of appetite.  Headache.  Low-grade fever. DIAGNOSIS  You might diagnose your own cold based on familiar symptoms, since most people get a cold 2 to 3 times a year. Your caregiver can confirm this based on your exam. Most importantly, your caregiver can check that your symptoms are not due to another disease such as strep throat, sinusitis, pneumonia, asthma, or  epiglottitis. Blood tests, throat tests, and X-rays are not necessary to diagnose a common cold, but they may sometimes be helpful in excluding other more serious diseases. Your caregiver will decide if any further tests are required. RISKS AND COMPLICATIONS  You may be at risk for a more severe case of the common cold if you smoke cigarettes, have chronic heart disease (such as heart failure) or lung disease (such as asthma), or if you have a weakened immune system. The very young and very old are also at risk for more serious infections. Bacterial sinusitis, middle ear infections, and bacterial pneumonia can complicate the common cold. The common cold can worsen asthma and chronic obstructive pulmonary disease (COPD). Sometimes, these complications can require emergency medical care and may be life-threatening. PREVENTION  The best way to protect against getting a cold is to practice good hygiene. Avoid oral or hand contact with people with cold symptoms. Wash your hands often if contact occurs. There is no clear evidence that vitamin C, vitamin E, echinacea, or exercise reduces the chance of developing a cold. However, it is always recommended to get plenty of rest and practice good nutrition. TREATMENT  Treatment is directed at relieving symptoms. There is no cure. Antibiotics are not effective, because the infection is caused by a virus, not by bacteria. Treatment may include:  Increased fluid intake. Sports drinks offer valuable electrolytes, sugars, and fluids.  Breathing heated mist or steam (vaporizer or shower).  Eating chicken soup or other clear broths, and maintaining good nutrition.  Getting plenty of rest.  Using gargles or lozenges for comfort.  Controlling  fevers with ibuprofen or acetaminophen as directed by your caregiver.  Increasing usage of your inhaler if you have asthma. Zinc gel and zinc lozenges, taken in the first 24 hours of the common cold, can shorten the duration  and lessen the severity of symptoms. Pain medicines may help with fever, muscle aches, and throat pain. A variety of non-prescription medicines are available to treat congestion and runny nose. Your caregiver can make recommendations and may suggest nasal or lung inhalers for other symptoms.  HOME CARE INSTRUCTIONS   Only take over-the-counter or prescription medicines for pain, discomfort, or fever as directed by your caregiver.  Use a warm mist humidifier or inhale steam from a shower to increase air moisture. This may keep secretions moist and make it easier to breathe.  Drink enough water and fluids to keep your urine clear or pale yellow.  Rest as needed.  Return to work when your temperature has returned to normal or as your caregiver advises. You may need to stay home longer to avoid infecting others. You can also use a face mask and careful hand washing to prevent spread of the virus. SEEK MEDICAL CARE IF:   After the first few days, you feel you are getting worse rather than better.  You need your caregiver's advice about medicines to control symptoms.  You develop chills, worsening shortness of breath, or brown or red sputum. These may be signs of pneumonia.  You develop yellow or brown nasal discharge or pain in the face, especially when you bend forward. These may be signs of sinusitis.  You develop a fever, swollen neck glands, pain with swallowing, or white areas in the back of your throat. These may be signs of strep throat. SEEK IMMEDIATE MEDICAL CARE IF:   You have a fever.  You develop severe or persistent headache, ear pain, sinus pain, or chest pain.  You develop wheezing, a prolonged cough, cough up blood, or have a change in your usual mucus (if you have chronic lung disease).  You develop sore muscles or a stiff neck. Document Released: 07/09/2000 Document Revised: 04/07/2011 Document Reviewed: 05/17/2010 [facility] Patient Information 2014 [place], [place].

## 2013-04-27 NOTE — ED Provider Notes (Signed)
CSN: [id number]     Arrival date & time 04/27/13  2207 History  This chart was scribed for non-physician practitioner, Hazel Sams, PA-C working with Orpah Greek, MD by Frederich Balding, ED scribe. This patient was seen in room [facility] and the patient's care was started at 10:31 PM.   Chief Complaint  Patient presents with  . Emesis  . Nasal Congestion   The history is provided by the patient. No language interpreter was used.   HPI Comments: Gregory Bryant is a 38 y.o. male with history of sickle cell anemia ([place]) who presents to the Emergency Department complaining of nasal congestion and productive cough that started 2 days ago. The cough started first and he states it was nonproductive at first. Pt states he has emesis that started around 4 PM today and has had two episodes. He states he blew his nose today and noticed some blood. Pt has also had subjective fever yesterday. Denies abdominal pain, sore throat, ear pain, rash. He states his co-workers have had pneumonia recently and he was worried about this. Denies history of asthma. No SOB.  No CP.  Past Medical History  Diagnosis Date  . Sickle cell anemia    History reviewed. No pertinent past surgical history. No family history on file. History  Substance Use Topics  . Smoking status: Never Smoker   . Smokeless tobacco: Not on file  . Alcohol Use: No    Review of Systems  Constitutional: Positive for fever.  HENT: Positive for congestion. Negative for ear pain and sore throat.   Respiratory: Positive for cough.   Gastrointestinal: Positive for vomiting. Negative for abdominal pain.  Skin: Negative for rash.  All other systems reviewed and are negative.   Allergies  Aspirin and Sulfa antibiotics  Home Medications   Current Outpatient Rx  Name  Route  Sig  Dispense  Refill  . azithromycin (ZITHROMAX) 250 MG tablet   Oral   Take 1 tablet (250 mg total) by mouth daily. Take first 2 tablets together, then 1  every day until finished.   6 tablet   0    BP 141/80  Pulse 87  Temp(Src) 98.6 F (37 C) (Oral)  Resp 18  SpO2 96%  Physical Exam  Nursing note and vitals reviewed. Constitutional: He is oriented to person, place, and time. He appears well-developed and well-nourished. No distress.  HENT:  Head: Normocephalic and atraumatic.  Right Ear: Tympanic membrane and ear canal normal.  Left Ear: Tympanic membrane and ear canal normal.  Mouth/Throat: Oropharynx is clear and moist. No oropharyngeal exudate, posterior oropharyngeal edema or posterior oropharyngeal erythema.  Eyes: EOM are normal.  Neck: Neck supple. No tracheal deviation present.  Cardiovascular: Normal rate, regular rhythm and normal heart sounds.   Pulmonary/Chest: Effort normal and breath sounds normal. No respiratory distress. He has no wheezes. He has no rales.  Musculoskeletal: Normal range of motion.  Neurological: He is alert and oriented to person, place, and time.  Skin: Skin is warm and dry.  Psychiatric: He has a normal mood and affect. His behavior is normal.    ED Course  Procedures   DIAGNOSTIC STUDIES: Oxygen Saturation is 96% on RA, normal by my interpretation.    COORDINATION OF CARE: 10:37 PM-patient seen and evaluated. Patient well-appearing in no acute distress. Normal respirations and O2 sats. Afebrile. Does not appear severely ill or toxic. Denies pain. Discussed treatment plan which includes chest xray with pt at bedside and pt agreed to  plan.   Chest x-ray reviewed. No signs of pneumonia. Likely URI with possible bronchitis. No significant wheezing on exam. Lungs were clear. This time patient able to return home and follow closely with his primary care provider. He agrees with plan.  Imaging Review Dg Chest 2 View  04/27/2013   CLINICAL DATA:  38 year old male with sickle cell disease. Emesis and congestion.  EXAM: CHEST  2 VIEW  COMPARISON:  Prior chest x-ray 01/25/2012  FINDINGS: Cardiac and  mediastinal contours remain within normal limits. Slightly increased central airway thickening and peribronchial cuffing. Mild prominence of the interstitial markings is similar compared to prior. No focal airspace consolidation. No pleural effusion or pneumothorax. Irregular sclerosis consistent with avascular necrosis noted in both humeral heads. This is unchanged compared to prior. No acute osseous abnormality. Visualized upper abdominal bowel gas pattern is unremarkable.  IMPRESSION: Slightly increased central airway thickening and peribronchial cuffing may represent acute bronchitis or viral respiratory infection.   Electronically Signed   By: Jacqulynn Cadet M.D.   On: 04/27/2013 22:58     MDM   Final diagnoses:  URI (upper respiratory infection)    I personally performed the services described in this documentation, which was scribed in my presence. The recorded information has been reviewed and is accurate.   Martie Lee, PA-C 04/27/13 2313

## 2013-04-27 NOTE — ED Provider Notes (Signed)
Medical screening examination/treatment/procedure(s) were performed by non-physician practitioner and as supervising physician I was immediately available for consultation/collaboration.   EKG Interpretation None        Orpah Greek, MD 04/27/13 [phone]

## 2014-09-20 ENCOUNTER — Emergency Department (HOSPITAL_COMMUNITY)
Admission: EM | Admit: 2014-09-20 | Discharge: 2014-09-20 | Disposition: A | Discharge status: Home / Self Care | Payer: Medicaid Other | Attending: Emergency Medicine | Admitting: Emergency Medicine

## 2014-09-20 ENCOUNTER — Encounter (HOSPITAL_COMMUNITY): Payer: Self-pay | Admitting: *Deleted

## 2014-09-20 DIAGNOSIS — Z862 Personal history of diseases of the blood and blood-forming organs and certain disorders involving the immune mechanism: Secondary | ICD-10-CM | POA: Insufficient documentation

## 2014-09-20 DIAGNOSIS — Z79899 Other long term (current) drug therapy: Secondary | ICD-10-CM | POA: Insufficient documentation

## 2014-09-20 DIAGNOSIS — E119 Type 2 diabetes mellitus without complications: Secondary | ICD-10-CM

## 2014-09-20 DIAGNOSIS — R252 Cramp and spasm: Secondary | ICD-10-CM | POA: Diagnosis present

## 2014-09-20 LAB — BASIC METABOLIC PANEL
Anion gap: 7 (ref 5–15)
BUN: 9 mg/dL (ref 6–20)
CALCIUM: 9.4 mg/dL (ref 8.9–10.3)
CO2: 26 mmol/L (ref 22–32)
CREATININE: 0.93 mg/dL (ref 0.61–1.24)
Chloride: 98 mmol/L — ABNORMAL LOW (ref 101–111)
GFR calc Af Amer: >60 mL/min (ref >60)
GLUCOSE: 350 mg/dL — AB (ref 65–99)
POTASSIUM: 4.5 mmol/L (ref 3.5–5.1)
Sodium: 131 mmol/L — ABNORMAL LOW (ref 135–145)

## 2014-09-20 LAB — CBC WITH DIFFERENTIAL/PLATELET
BASOS ABS: 0 10*3/uL (ref 0.0–0.1)
Basophils Relative: 0 % (ref 0–1)
Eosinophils Absolute: 2.1 10*3/uL — ABNORMAL HIGH (ref 0.0–0.7)
Eosinophils Relative: 10 % — ABNORMAL HIGH (ref 0–5)
HEMATOCRIT: 36.2 % — AB (ref 39.0–52.0)
Hemoglobin: 13 g/dL (ref 13.0–17.0)
LYMPHS ABS: 7 10*3/uL — AB (ref 0.7–4.0)
Lymphocytes Relative: 34 % (ref 12–46)
MCH: 29.4 pg (ref 26.0–34.0)
MCHC: 35.9 g/dL (ref 30.0–36.0)
MCV: 81.9 fL (ref 78.0–100.0)
MONOS PCT: 7 % (ref 3–12)
Monocytes Absolute: 1.4 10*3/uL — ABNORMAL HIGH (ref 0.1–1.0)
NEUTROS ABS: 10.1 10*3/uL — AB (ref 1.7–7.7)
Neutrophils Relative %: 49 % (ref 43–77)
Platelets: 414 10*3/uL — ABNORMAL HIGH (ref 150–400)
RBC: 4.42 MIL/uL (ref 4.22–5.81)
RDW: 15.8 % — AB (ref 11.5–15.5)
WBC: 20.6 10*3/uL — ABNORMAL HIGH (ref 4.0–10.5)

## 2014-09-20 LAB — URINALYSIS, ROUTINE W REFLEX MICROSCOPIC
BILIRUBIN URINE: NEGATIVE
Glucose, UA: >1000 mg/dL — AB
HGB URINE DIPSTICK: NEGATIVE
Ketones, ur: NEGATIVE mg/dL
Leukocytes, UA: NEGATIVE
Nitrite: NEGATIVE
PH: 7 (ref 5.0–8.0)
Protein, ur: NEGATIVE mg/dL
SPECIFIC GRAVITY, URINE: 1.021 (ref 1.005–1.030)
UROBILINOGEN UA: 1 mg/dL (ref 0.0–1.0)

## 2014-09-20 LAB — I-STAT CHEM 8, ED
BUN: 11 mg/dL (ref 6–20)
CREATININE: 0.9 mg/dL (ref 0.61–1.24)
Calcium, Ion: 1.16 mmol/L (ref 1.12–1.23)
Chloride: 99 mmol/L — ABNORMAL LOW (ref 101–111)
Glucose, Bld: 368 mg/dL — ABNORMAL HIGH (ref 65–99)
HEMATOCRIT: 38 % — AB (ref 39.0–52.0)
HEMOGLOBIN: 12.9 g/dL — AB (ref 13.0–17.0)
Potassium: 5.4 mmol/L — ABNORMAL HIGH (ref 3.5–5.1)
SODIUM: 132 mmol/L — AB (ref 135–145)
TCO2: 26 mmol/L (ref 0–100)

## 2014-09-20 LAB — CBG MONITORING, ED: Glucose-Capillary: 302 mg/dL — ABNORMAL HIGH (ref 65–99)

## 2014-09-20 LAB — URINE MICROSCOPIC-ADD ON: URINE-OTHER: NONE SEEN

## 2014-09-20 MED ORDER — METFORMIN HCL 500 MG PO TABS: 500.0000 mg | ORAL_TABLET | Freq: Two times a day (BID) | ORAL | Status: DC

## 2014-09-20 MED ORDER — SODIUM CHLORIDE 0.9 % IV BOLUS (SEPSIS)
1000.0000 mL | Freq: Once | INTRAVENOUS | Status: AC
  Administered 2014-09-20: 1000 mL via INTRAVENOUS

## 2014-09-20 NOTE — ED Notes (Addendum)
Pt complains of muscle spasms in his left leg, calf and both hands since yesterday. 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 Pt has a hx of sickle cell anemia, states this does not feel like sickle cell pain.

## 2014-09-20 NOTE — ED Provider Notes (Signed)
CSN: [id number]     Arrival date & time 09/20/14  1407 History   First MD Initiated Contact with Patient 09/20/14 1716     Chief Complaint  Patient presents with  . Spasms     (Consider location/radiation/quality/duration/timing/severity/associated sxs/prior Treatment) HPI Comments: Patient presents with complaint of severe muscle cramping in left leg and bilateral hands that started around 10:00 a.m. yesterday. It lasted approximately 10 minutes and he has had on and off pain since that time. No history of similar symptoms. He reports a history of sickle cell disease but denies that this is similar to 'crisis' pain. No fevers. 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  No nausea, vomiting, chest or abdominal pain, SOB, headaches or syncope/near syncope.  The history is provided by the patient. No language interpreter was used.    Past Medical History  Diagnosis Date  . Sickle cell anemia    History reviewed. No pertinent past surgical history. No family history on file. Social History  Substance Use Topics  . Smoking status: Never Smoker   . Smokeless tobacco: None  . Alcohol Use: No    Review of Systems  Constitutional: Negative for fever and chills.  HENT: Negative.   Respiratory: Negative.  Negative for shortness of breath.   Cardiovascular: Negative.  Negative for chest pain.  Gastrointestinal: Negative.  Negative for nausea, vomiting and abdominal pain.  Musculoskeletal: Positive for myalgias. Negative for neck stiffness.  Skin: Negative.  Negative for color change and rash.  Neurological: Negative.  Negative for syncope and headaches.      Allergies  Aspirin and Sulfa antibiotics  Home Medications   Prior to Admission medications   Medication Sig Start Date End Date Taking? Authorizing Provider  cetirizine-pseudoephedrine (ZYRTEC-D) 5-120 MG per tablet Take 1 tablet by mouth 2 (two) times daily. Patient taking  differently: Take 1 tablet by mouth 2 (two) times daily as needed for allergies.  04/27/13  Yes Hazel Sams, PA-C  DM-Doxylamine-Acetaminophen (NYQUIL COLD & FLU PO) Take 30 mLs by mouth at bedtime as needed (cold/cough).   Yes Historical Provider, MD  Oxycodone HCl 10 MG TABS TAKE 1 TABLET BY MOUTH 4 TIMES A DAY AS NEEDED FOR PAIN 08/01/14  Yes Historical Provider, MD  albuterol (PROVENTIL HFA;VENTOLIN HFA) 108 (90 BASE) MCG/ACT inhaler Inhale 1-2 puffs into the lungs every 6 (six) hours as needed for wheezing or shortness of breath. Patient not taking: Reported on 09/20/2014 04/27/13   Hazel Sams, PA-C  guaiFENesin (MUCINEX) 600 MG 12 hr tablet Take 1 tablet (600 mg total) by mouth 2 (two) times daily. Patient not taking: Reported on 09/20/2014 04/27/13   Hazel Sams, PA-C   BP 135/82 mmHg  Pulse 64  Temp(Src) 98.7 F (37.1 C) (Oral)  Resp 18  SpO2 98% Physical Exam  Constitutional: He is oriented to person, place, and time. He appears well-developed and well-nourished.  HENT:  Head: Normocephalic.  Neck: Normal range of motion. Neck supple.  Cardiovascular: Normal rate, regular rhythm and intact distal pulses.   Pulmonary/Chest: Effort normal and breath sounds normal.  Abdominal: Soft. Bowel sounds are normal. There is no tenderness. There is no rebound and no guarding.  Musculoskeletal: Normal range of motion. He exhibits no edema.  Full ROM all extremities with full strength.   Neurological: He is alert and oriented to person, place, and time.  Skin: Skin is warm and dry. No rash noted.  Psychiatric: He has a  normal mood and affect.    ED Course  Procedures (including critical care time) Labs Review Labs Reviewed  I-STAT CHEM 8, ED    Imaging Review No results found. I have personally reviewed and evaluated these images and lab results as part of my medical decision-making.   EKG Interpretation None      MDM   Final diagnoses:  None    1. New onset Type 2 DM  The  patient's CBG greater than 300 indicating new diagnosis of diabetes. No evidence of acidosis on lab studies. Patient provided IV fluids and started on Metformin 500 mg BID. He will call his doctor in the morning to schedule a time to be seen for ongoing DM management and education. Return precautions discussed.     Charlann Lange, PA-C 09/21/14 0110  Virgel Manifold, MD 09/21/14 1435

## 2014-09-20 NOTE — Discharge Instructions (Signed)
Blood Glucose Monitoring °Monitoring your blood glucose (also know as blood sugar) helps you to manage your diabetes. It also helps you and your health care provider monitor your diabetes and determine how well your treatment plan is working. °WHY SHOULD YOU MONITOR YOUR BLOOD GLUCOSE? °· It can help you understand how food, exercise, and medicine affect your blood glucose. °· It allows you to know what your blood glucose is at any given moment. You can quickly tell if you are having low blood glucose (hypoglycemia) or high blood glucose (hyperglycemia). °· It can help you and your health care provider know how to adjust your medicines. °· It can help you understand how to manage an illness or adjust medicine for exercise. °WHEN SHOULD YOU TEST? °Your health care provider will help you decide how often you should check your blood glucose. This may depend on the type of diabetes you have, your diabetes control, or the types of medicines you are taking. Be sure to write down all of your blood glucose readings so that this information can be reviewed with your health care provider. See below for examples of testing times that your health care provider may suggest. °Type 1 Diabetes °· Test 4 times a day if you are in good control, using an insulin pump, or perform multiple daily injections. °· If your diabetes is not well controlled or if you are sick, you may need to monitor more often. °· It is a good idea to also monitor: °· Before and after exercise. °· Between meals and 2 hours after a meal. °· Occasionally between 2:00 a.m. and 3:00 a.m. °Type 2 Diabetes °· It can vary with each person, but generally, if you are on insulin, test 4 times a day. °· If you take medicines by mouth (orally), test 2 times a day. °· If you are on a controlled diet, test once a day. °· If your diabetes is not well controlled or if you are sick, you may need to monitor more often. °HOW TO MONITOR YOUR BLOOD GLUCOSE °Supplies  Needed °· Blood glucose meter. °· Test strips for your meter. Each meter has its own strips. You must use the strips that go with your own meter. °· A pricking needle (lancet). °· A device that holds the lancet (lancing device). °· A journal or log book to write down your results. °Procedure °· Wash your hands with soap and water. Alcohol is not preferred. °· Prick the side of your finger (not the tip) with the lancet. °· Gently milk the finger until a small drop of blood appears. °· Follow the instructions that come with your meter for inserting the test strip, applying blood to the strip, and using your blood glucose meter. °Other Areas to Get Blood for Testing °Some meters allow you to use other areas of your body (other than your finger) to test your blood. These areas are called alternative sites. The most common alternative sites are: °· The forearm. °· The thigh. °· The back area of the lower leg. °· The palm of the hand. °The blood flow in these areas is slower. Therefore, the blood glucose values you get may be delayed, and the numbers are different from what you would get from your fingers. Do not use alternative sites if you think you are having hypoglycemia. Your reading will not be accurate. Always use a finger if you are having hypoglycemia. Also, if you cannot feel your lows (hypoglycemia unawareness), always use your fingers for your   blood glucose checks. °ADDITIONAL TIPS FOR GLUCOSE MONITORING °· Do not reuse lancets. °· Always carry your supplies with you. °· All blood glucose meters have a 24-hour "hotline" number to call if you have questions or need help. °· Adjust (calibrate) your blood glucose meter with a control solution after finishing a few boxes of strips. °BLOOD GLUCOSE RECORD KEEPING °It is a good idea to keep a daily record or log of your blood glucose readings. Most glucose meters, if not all, keep your glucose records stored in the meter. Some meters come with the ability to download  your records to your home computer. Keeping a record of your blood glucose readings is especially helpful if you are wanting to look for patterns. Make notes to go along with the blood glucose readings because you might forget what happened at that exact time. Keeping good records helps you and your health care provider to work together to achieve good diabetes management.  °Document Released: 01/16/2003 Document Revised: 05/30/2013 Document Reviewed: 06/07/2012 °ExitCare® Patient Information ©2015 [employer]. This information is not intended to replace advice given to you by your health care provider. Make sure you discuss any questions you have with your health care provider. ° °Diabetes Mellitus and Food °It is important for you to manage your blood sugar (glucose) level. Your blood glucose level can be greatly affected by what you eat. Eating healthier foods in the appropriate amounts throughout the day at about the same time each day will help you control your blood glucose level. It can also help slow or prevent worsening of your diabetes mellitus. Healthy eating may even help you improve the level of your blood pressure and reach or maintain a healthy weight.  °HOW CAN FOOD AFFECT ME? °Carbohydrates °Carbohydrates affect your blood glucose level more than any other type of food. Your dietitian will help you determine how many carbohydrates to eat at each meal and teach you how to count carbohydrates. Counting carbohydrates is important to keep your blood glucose at a healthy level, especially if you are using insulin or taking certain medicines for diabetes mellitus. °Alcohol °Alcohol can cause sudden decreases in blood glucose (hypoglycemia), especially if you use insulin or take certain medicines for diabetes mellitus. Hypoglycemia can be a life-threatening condition. Symptoms of hypoglycemia (sleepiness, dizziness, and disorientation) are similar to symptoms of having too much alcohol.  °If your health  care provider has given you approval to drink alcohol, do so in moderation and use the following guidelines: °· Women should not have more than one drink per day, and men should not have more than two drinks per day. One drink is equal to: °¨ 12 oz of beer. °¨ 5 oz of wine. °¨ 1½ oz of hard liquor. °· Do not drink on an empty stomach. °· Keep yourself hydrated. Have water, diet soda, or unsweetened iced tea. °· Regular soda, juice, and other mixers might contain a lot of carbohydrates and should be counted. °WHAT FOODS ARE NOT RECOMMENDED? °As you make food choices, it is important to remember that all foods are not the same. Some foods have fewer nutrients per serving than other foods, even though they might have the same number of calories or carbohydrates. It is difficult to get your body what it needs when you eat foods with fewer nutrients. Examples of foods that you should avoid that are high in calories and carbohydrates but low in nutrients include: °· Trans fats (most processed foods list trans fats on the   Nutrition Facts label). °· Regular soda. °· Juice. °· Candy. °· Sweets, such as cake, pie, doughnuts, and cookies. °· Fried foods. °WHAT FOODS CAN I EAT? °Have nutrient-rich foods, which will nourish your body and keep you healthy. The food you should eat also will depend on several factors, including: °· The calories you need. °· The medicines you take. °· Your weight. °· Your blood glucose level. °· Your blood pressure level. °· Your cholesterol level. °You also should eat a variety of foods, including: °· Protein, such as meat, poultry, fish, tofu, nuts, and seeds (lean animal proteins are best). °· Fruits. °· Vegetables. °· Dairy products, such as milk, cheese, and yogurt (low fat is best). °· Breads, grains, pasta, cereal, rice, and beans. °· Fats such as olive oil, trans fat-free margarine, canola oil, avocado, and olives. °DOES EVERYONE WITH DIABETES MELLITUS HAVE THE SAME MEAL PLAN? °Because every  person with diabetes mellitus is different, there is not one meal plan that works for everyone. It is very important that you meet with a dietitian who will help you create a meal plan that is just right for you. °Document Released: 10/10/2004 Document Revised: 01/18/2013 Document Reviewed: 12/10/2012 °Exit[employer]® Patient Information ©2015 [employer]. This information is not intended to replace advice given to you by your health care provider. Make sure you discuss any questions you have with your health care provider. ° °

## 2014-09-20 NOTE — ED Notes (Signed)
Notified RN,Tiffany pt. [facility] [id number].

## 2015-03-07 ENCOUNTER — Encounter (HOSPITAL_COMMUNITY): Payer: Self-pay

## 2015-03-07 ENCOUNTER — Emergency Department (HOSPITAL_COMMUNITY)
Admission: EM | Admit: 2015-03-07 | Discharge: 2015-03-07 | Disposition: A | Discharge status: Home / Self Care | Payer: Medicaid Other | Attending: Emergency Medicine | Admitting: Emergency Medicine

## 2015-03-07 ENCOUNTER — Emergency Department (HOSPITAL_COMMUNITY): Payer: Medicaid Other

## 2015-03-07 DIAGNOSIS — R197 Diarrhea, unspecified: Secondary | ICD-10-CM

## 2015-03-07 DIAGNOSIS — R112 Nausea with vomiting, unspecified: Secondary | ICD-10-CM

## 2015-03-07 DIAGNOSIS — I880 Nonspecific mesenteric lymphadenitis: Secondary | ICD-10-CM | POA: Diagnosis not present

## 2015-03-07 DIAGNOSIS — D473 Essential (hemorrhagic) thrombocythemia: Secondary | ICD-10-CM

## 2015-03-07 DIAGNOSIS — D72829 Elevated white blood cell count, unspecified: Secondary | ICD-10-CM | POA: Diagnosis not present

## 2015-03-07 DIAGNOSIS — K8020 Calculus of gallbladder without cholecystitis without obstruction: Secondary | ICD-10-CM | POA: Insufficient documentation

## 2015-03-07 DIAGNOSIS — M87059 Idiopathic aseptic necrosis of unspecified femur: Secondary | ICD-10-CM | POA: Diagnosis not present

## 2015-03-07 DIAGNOSIS — A084 Viral intestinal infection, unspecified: Secondary | ICD-10-CM | POA: Insufficient documentation

## 2015-03-07 DIAGNOSIS — Z79899 Other long term (current) drug therapy: Secondary | ICD-10-CM | POA: Diagnosis not present

## 2015-03-07 DIAGNOSIS — D5700 Hb-SS disease with crisis, unspecified: Secondary | ICD-10-CM | POA: Diagnosis present

## 2015-03-07 DIAGNOSIS — Z7984 Long term (current) use of oral hypoglycemic drugs: Secondary | ICD-10-CM | POA: Diagnosis not present

## 2015-03-07 DIAGNOSIS — D75839 Thrombocytosis, unspecified: Secondary | ICD-10-CM

## 2015-03-07 DIAGNOSIS — R17 Unspecified jaundice: Secondary | ICD-10-CM | POA: Insufficient documentation

## 2015-03-07 DIAGNOSIS — R1031 Right lower quadrant pain: Secondary | ICD-10-CM

## 2015-03-07 DIAGNOSIS — E1165 Type 2 diabetes mellitus with hyperglycemia: Secondary | ICD-10-CM | POA: Insufficient documentation

## 2015-03-07 HISTORY — DX: Type 2 diabetes mellitus without complications: E11.9

## 2015-03-07 LAB — COMPREHENSIVE METABOLIC PANEL
ALT: 30 U/L (ref 17–63)
AST: 34 U/L (ref 15–41)
Albumin: 5.2 g/dL — ABNORMAL HIGH (ref 3.5–5.0)
Alkaline Phosphatase: 92 U/L (ref 38–126)
Anion gap: 12 (ref 5–15)
BILIRUBIN TOTAL: 6 mg/dL — AB (ref 0.3–1.2)
BUN: 14 mg/dL (ref 6–20)
CHLORIDE: 106 mmol/L (ref 101–111)
CO2: 21 mmol/L — ABNORMAL LOW (ref 22–32)
Calcium: 10.2 mg/dL (ref 8.9–10.3)
Creatinine, Ser: 0.95 mg/dL (ref 0.61–1.24)
Glucose, Bld: 140 mg/dL — ABNORMAL HIGH (ref 65–99)
POTASSIUM: 3.8 mmol/L (ref 3.5–5.1)
Sodium: 139 mmol/L (ref 135–145)
TOTAL PROTEIN: 9.5 g/dL — AB (ref 6.5–8.1)

## 2015-03-07 LAB — CBC WITH DIFFERENTIAL/PLATELET
BASOS ABS: 0 10*3/uL (ref 0.0–0.1)
Basophils Relative: 0 %
EOS PCT: 0 %
Eosinophils Absolute: 0 10*3/uL (ref 0.0–0.7)
HEMATOCRIT: 39.8 % (ref 39.0–52.0)
Hemoglobin: 14.5 g/dL (ref 13.0–17.0)
LYMPHS ABS: 1.2 10*3/uL (ref 0.7–4.0)
LYMPHS PCT: 6 %
MCH: 29.4 pg (ref 26.0–34.0)
MCHC: 36.4 g/dL — AB (ref 30.0–36.0)
MCV: 80.6 fL (ref 78.0–100.0)
MONO ABS: 1.1 10*3/uL — AB (ref 0.1–1.0)
MONOS PCT: 6 %
NEUTROS ABS: 16.9 10*3/uL — AB (ref 1.7–7.7)
Neutrophils Relative %: 88 %
PLATELETS: 402 10*3/uL — AB (ref 150–400)
RBC: 4.94 MIL/uL (ref 4.22–5.81)
RDW: 16.6 % — AB (ref 11.5–15.5)
WBC: 19.3 10*3/uL — ABNORMAL HIGH (ref 4.0–10.5)

## 2015-03-07 LAB — URINALYSIS, ROUTINE W REFLEX MICROSCOPIC
Bilirubin Urine: NEGATIVE
GLUCOSE, UA: NEGATIVE mg/dL
HGB URINE DIPSTICK: NEGATIVE
KETONES UR: NEGATIVE mg/dL
LEUKOCYTES UA: NEGATIVE
Nitrite: NEGATIVE
PROTEIN: NEGATIVE mg/dL
Specific Gravity, Urine: 1.013 (ref 1.005–1.030)
pH: 8 (ref 5.0–8.0)

## 2015-03-07 LAB — CBG MONITORING, ED: GLUCOSE-CAPILLARY: 110 mg/dL — AB (ref 65–99)

## 2015-03-07 LAB — RETICULOCYTES
RBC.: 4.94 MIL/uL (ref 4.22–5.81)
RETIC COUNT ABSOLUTE: 400.1 10*3/uL — AB (ref 19.0–186.0)
RETIC CT PCT: 8.1 % — AB (ref 0.4–3.1)

## 2015-03-07 LAB — LIPASE, BLOOD: LIPASE: 26 U/L (ref 11–51)

## 2015-03-07 MED ORDER — ONDANSETRON 8 MG PO TBDP: 8.0000 mg | ORAL_TABLET | Freq: Three times a day (TID) | ORAL | Status: DC | PRN

## 2015-03-07 MED ORDER — IOHEXOL 300 MG/ML  SOLN
50.0000 mL | Freq: Once | INTRAMUSCULAR | Status: AC | PRN
  Administered 2015-03-07: 50 mL via ORAL

## 2015-03-07 MED ORDER — IOHEXOL 300 MG/ML  SOLN
100.0000 mL | Freq: Once | INTRAMUSCULAR | Status: AC | PRN
  Administered 2015-03-07: 100 mL via INTRAVENOUS

## 2015-03-07 MED ORDER — ONDANSETRON HCL 4 MG/2ML IJ SOLN
4.0000 mg | INTRAMUSCULAR | Status: DC | PRN
  Administered 2015-03-07: 4 mg via INTRAVENOUS
  Filled 2015-03-07: qty 2

## 2015-03-07 MED ORDER — MORPHINE SULFATE (PF) 4 MG/ML IV SOLN
0.1000 mg/kg | INTRAVENOUS | Status: DC
  Administered 2015-03-07: 7.9 mg via INTRAVENOUS

## 2015-03-07 MED ORDER — SODIUM CHLORIDE 0.9 % IV BOLUS (SEPSIS)
1000.0000 mL | Freq: Once | INTRAVENOUS | Status: AC
  Administered 2015-03-07: 1000 mL via INTRAVENOUS

## 2015-03-07 MED ORDER — MORPHINE SULFATE (PF) 4 MG/ML IV SOLN: 4.0000 mg | Freq: Once | INTRAVENOUS | Status: DC

## 2015-03-07 MED ORDER — MORPHINE SULFATE (PF) 4 MG/ML IV SOLN: 0.1000 mg/kg | INTRAVENOUS | Status: DC

## 2015-03-07 MED ORDER — MORPHINE SULFATE (PF) 4 MG/ML IV SOLN
7.9000 mg | Freq: Once | INTRAVENOUS | Status: DC
  Filled 2015-03-07: qty 2

## 2015-03-07 NOTE — Discharge Instructions (Signed)
Use zofran as prescribed, as needed for nausea. Stay well hydrated with small sips of fluids throughout the day. Follow a BRAT (banana-rice-applesauce-toast) diet as described below for the next 24-48 hours. The 'BRAT' diet is suggested, then progress to diet as tolerated as symptoms abate. Call your regular doctor if bloody stools, persistent diarrhea, vomiting, fever or abdominal pain. Use your home pain medications as needed for pain, using tylenol or motrin for additional relief. 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 Return to ER for changing or worsening of symptoms.  Food Choices to Help Relieve Diarrhea When you have diarrhea, the foods you eat and your eating habits are very important. Choosing the right foods and drinks can help relieve diarrhea. Also, because diarrhea can last up to 7 days, you need to replace lost fluids and electrolytes (such as sodium, potassium, and chloride) in order to help prevent dehydration.  WHAT GENERAL GUIDELINES DO I NEED TO FOLLOW?  Slowly drink 1 cup (8 oz) of fluid for each episode of diarrhea. If you are getting enough fluid, your urine will be clear or pale yellow.  Eat starchy foods. Some good choices include white rice, white toast, pasta, low-fiber cereal, baked potatoes (without the skin), saltine crackers, and bagels.  Avoid large servings of any cooked vegetables.  Limit fruit to two servings per day. A serving is  cup or 1 small piece.  Choose foods with less than 2 g of fiber per serving.  Limit fats to less than 8 tsp (38 g) per day.  Avoid fried foods.  Eat foods that have probiotics in them. Probiotics can be found in certain dairy products.  Avoid foods and beverages that may increase the speed at which food moves through the stomach and intestines (gastrointestinal tract). Things to avoid include:  High-fiber foods, such as dried fruit, raw fruits  and vegetables, nuts, seeds, and whole grain foods.  Spicy foods and high-fat foods.  Foods and beverages sweetened with high-fructose corn syrup, honey, or sugar alcohols such as xylitol, sorbitol, and mannitol. WHAT FOODS ARE RECOMMENDED? Grains White rice. White, [place], or pita breads (fresh or toasted), including plain rolls, buns, or bagels. White pasta. Saltine, soda, or graham crackers. Pretzels. Low-fiber cereal. Cooked cereals made with water (such as cornmeal, farina, or cream cereals). Plain muffins. Matzo. Melba toast. Zwieback.  Vegetables Potatoes (without the skin). Strained tomato and vegetable juices. Most well-cooked and canned vegetables without seeds. Tender lettuce. Fruits Cooked or canned applesauce, apricots, cherries, fruit cocktail, grapefruit, peaches, pears, or plums. Fresh bananas, apples without skin, cherries, grapes, cantaloupe, grapefruit, peaches, oranges, or plums.  Meat and Other Protein Products Baked or boiled chicken. Eggs. Tofu. Fish. Seafood. Smooth peanut butter. Ground or well-cooked tender beef, ham, veal, lamb, pork, or poultry.  Dairy Plain yogurt, kefir, and unsweetened liquid yogurt. Lactose-free milk, buttermilk, or soy milk. Plain hard cheese. Beverages Sport drinks. Clear broths. Diluted fruit juices (except prune). Regular, caffeine-free sodas such as ginger ale. Water. Decaffeinated teas. Oral rehydration solutions. Sugar-free beverages not sweetened with sugar alcohols. Other Bouillon, broth, or soups made from recommended foods.  The items listed above may not be a complete list of recommended foods or beverages. Contact your dietitian for more options. WHAT FOODS ARE NOT RECOMMENDED? Grains Whole grain, whole wheat, bran, or rye breads, rolls, pastas, crackers, and cereals. Wild or brown rice. Cereals that contain more than 2 g of  fiber per serving. Corn tortillas or taco shells. Cooked or dry oatmeal. Granola. Popcorn. Vegetables Raw  vegetables. Cabbage, broccoli, Brussels sprouts, artichokes, baked beans, beet greens, corn, kale, legumes, peas, sweet potatoes, and yams. Potato skins. Cooked spinach and cabbage. Fruits Dried fruit, including raisins and dates. Raw fruits. Stewed or dried prunes. Fresh apples with skin, apricots, mangoes, pears, raspberries, and strawberries.  Meat and Other Protein Products Chunky peanut butter. Nuts and seeds. Beans and lentils. Berniece Salines.  Dairy High-fat cheeses. Milk, chocolate milk, and beverages made with milk, such as milk shakes. Cream. Ice cream. Sweets and Desserts Sweet rolls, doughnuts, and sweet breads. Pancakes and waffles. Fats and Oils Butter. Cream sauces. Margarine. Salad oils. Plain salad dressings. Olives. Avocados.  Beverages Caffeinated beverages (such as coffee, tea, soda, or energy drinks). Alcoholic beverages. Fruit juices with pulp. Prune juice. Soft drinks sweetened with high-fructose corn syrup or sugar alcohols. Other Coconut. Hot sauce. Chili powder. Mayonnaise. Gravy. Cream-based or milk-based soups.  The items listed above may not be a complete list of foods and beverages to avoid. Contact your dietitian for more information. WHAT SHOULD I DO IF I BECOME DEHYDRATED? Diarrhea can sometimes lead to dehydration. Signs of dehydration include dark urine and dry mouth and skin. If you think you are dehydrated, you should rehydrate with an oral rehydration solution. These solutions can be purchased at pharmacies, retail stores, or online.  Drink -1 cup (120-240 mL) of oral rehydration solution each time you have an episode of diarrhea. If drinking this amount makes your diarrhea worse, try drinking smaller amounts more often. For example, drink 1-3 tsp (5-15 mL) every 5-10 minutes.  A general rule for staying hydrated is to drink 1-2 L of fluid per day. Talk to your health care provider about the specific amount you should be drinking each day. Drink enough fluids to keep  your urine clear or pale yellow. Document Released: 04/05/2003 Document Revised: 01/18/2013 Document Reviewed: 12/06/2012 [facility] Patient Information 2015 [place], [place]. This information is not intended to replace advice given to you by your health care provider. Make sure you discuss any questions you have with your health care provider.    Abdominal Pain, Adult Many things can cause belly (abdominal) pain. Most times, the belly pain is not dangerous. Many cases of belly pain can be watched and treated at home. HOME CARE   Do not take medicines that help you go poop (laxatives) unless told to by your doctor.  Only take medicine as told by your doctor.  Eat or drink as told by your doctor. Your doctor will tell you if you should be on a special diet. GET HELP IF:  You do not know what is causing your belly pain.  You have belly pain while you are sick to your stomach (nauseous) or have runny poop (diarrhea).  You have pain while you pee or poop.  Your belly pain wakes you up at night.  You have belly pain that gets worse or better when you eat.  You have belly pain that gets worse when you eat fatty foods.  You have a fever. GET HELP RIGHT AWAY IF:   The pain does not go away within 2 hours.  You keep throwing up (vomiting).  The pain changes and is only in the right or left part of the belly.  You have bloody or tarry looking poop. MAKE SURE YOU:   Understand these instructions.  Will watch your condition.  Will get help right away  if you are not doing well or get worse.   This information is not intended to replace advice given to you by your health care provider. Make sure you discuss any questions you have with your health care provider.   Document Released: 07/02/2007 Document Revised: 02/03/2014 Document Reviewed: 09/22/2012 Elsevier Interactive Patient Education 2016 [employer]  Viral Gastroenteritis Viral gastroenteritis is also called stomach flu.  This illness is caused by a certain type of germ (virus). It can cause sudden watery poop (diarrhea) and throwing up (vomiting). This can cause you to lose body fluids (dehydration). This illness usually lasts for 3 to 8 days. It usually goes away on its own. HOME CARE   Drink enough fluids to keep your pee (urine) clear or pale yellow. Drink small amounts of fluids often.  Ask your doctor how to replace body fluid losses (rehydration).  Avoid:  Foods high in sugar.  Alcohol.  Bubbly (carbonated) drinks.  Tobacco.  Juice.  Caffeine drinks.  Very hot or cold fluids.  Fatty, greasy foods.  Eating too much at one time.  Dairy products until 24 to 48 hours after your watery poop stops.  You may eat foods with active cultures (probiotics). They can be found in some yogurts and supplements.  Wash your hands well to avoid spreading the illness.  Only take medicines as told by your doctor. Do not give aspirin to children. Do not take medicines for watery poop (antidiarrheals).  Ask your doctor if you should keep taking your regular medicines.  Keep all doctor visits as told. GET HELP RIGHT AWAY IF:   You cannot keep fluids down.  You do not pee at least once every 6 to 8 hours.  You are short of breath.  You see blood in your poop or throw up. This may look like coffee grounds.  You have belly (abdominal) pain that gets worse or is just in one small spot (localized).  You keep throwing up or having watery poop.  You have a fever.  The patient is a child younger than 3 months, and he or she has a fever.  The patient is a child older than 3 months, and he or she has a fever and problems that do not go away.  The patient is a child older than 3 months, and he or she has a fever and problems that suddenly get worse.  The patient is a baby, and he or she has no tears when crying. MAKE SURE YOU:   Understand these instructions.  Will watch your condition.  Will  get help right away if you are not doing well or get worse.   This information is not intended to replace advice given to you by your health care provider. Make sure you discuss any questions you have with your health care provider.   Document Released: 07/02/2007 Document Revised: 04/07/2011 Document Reviewed: 10/30/2010 Elsevier Interactive Patient Education 2016 [employer] Inc.  Nausea and Vomiting Nausea means you feel sick to your stomach. Throwing up (vomiting) is a reflex where stomach contents come out of your mouth. HOME CARE   Take medicine as told by your doctor.  Do not force yourself to eat. However, you do need to drink fluids.  If you feel like eating, eat a normal diet as told by your doctor.  Eat rice, wheat, potatoes, bread, lean meats, yogurt, fruits, and vegetables.  Avoid high-fat foods.  Drink enough fluids to keep your pee (urine) clear or pale yellow.  Ask your doctor how to replace body fluid losses (rehydrate). Signs of body fluid loss (dehydration) include:  Feeling very thirsty.  Dry lips and mouth.  Feeling dizzy.  Dark pee.  Peeing less than normal.  Feeling confused.  Fast breathing or heart rate. GET HELP RIGHT AWAY IF:   You have blood in your throw up.  You have black or bloody poop (stool).  You have a bad headache or stiff neck.  You feel confused.  You have bad belly (abdominal) pain.  You have chest pain or trouble breathing.  You do not pee at least once every 8 hours.  You have cold, clammy skin.  You keep throwing up after 24 to 48 hours.  You have a fever. MAKE SURE YOU:   Understand these instructions.  Will watch your condition.  Will get help right away if you are not doing well or get worse.   This information is not intended to replace advice given to you by your health care provider. Make sure you discuss any questions you have with your health care provider.   Document Released: 07/02/2007 Document  Revised: 04/07/2011 Document Reviewed: 06/14/2010 Elsevier Interactive Patient Education [address].  Diarrhea Diarrhea is frequent loose and watery bowel movements. It can cause you to feel weak and dehydrated. Dehydration can cause you to become tired and thirsty, have a dry mouth, and have decreased urination that often is dark yellow. Diarrhea is a sign of another problem, most often an infection that will not last long. In most cases, diarrhea typically lasts 2-3 days. However, it can last longer if it is a sign of something more serious. It is important to treat your diarrhea as directed by your caregiver to lessen or prevent future episodes of diarrhea. CAUSES  Some common causes include:  Gastrointestinal infections caused by viruses, bacteria, or parasites.  Food poisoning or food allergies.  Certain medicines, such as antibiotics, chemotherapy, and laxatives.  Artificial sweeteners and fructose.  Digestive disorders. HOME CARE INSTRUCTIONS  Ensure adequate fluid intake (hydration): Have 1 cup (8 oz) of fluid for each diarrhea episode. Avoid fluids that contain simple sugars or sports drinks, fruit juices, whole milk products, and sodas. Your urine should be clear or pale yellow if you are drinking enough fluids. Hydrate with an oral rehydration solution that you can purchase at pharmacies, retail stores, and online. You can prepare an oral rehydration solution at home by mixing the following ingredients together:   - tsp table salt.   tsp baking soda.   tsp salt substitute containing potassium chloride.  1  tablespoons sugar.  1 L (34 oz) of water.  Certain foods and beverages may increase the speed at which food moves through the gastrointestinal (GI) tract. These foods and beverages should be avoided and include:  Caffeinated and alcoholic beverages.  High-fiber foods, such as raw fruits and vegetables, nuts, seeds, and whole grain breads and cereals.  Foods  and beverages sweetened with sugar alcohols, such as xylitol, sorbitol, and mannitol.  Some foods may be well tolerated and may help thicken stool including:  Starchy foods, such as rice, toast, pasta, low-sugar cereal, oatmeal, grits, baked potatoes, crackers, and bagels.  Bananas.  Applesauce.  Add probiotic-rich foods to help increase healthy bacteria in the GI tract, such as yogurt and fermented milk products.  Wash your hands well after each diarrhea episode.  Only take over-the-counter or prescription medicines as directed by your caregiver.  Take a warm bath to  relieve any burning or pain from frequent diarrhea episodes. SEEK IMMEDIATE MEDICAL CARE IF:   You are unable to keep fluids down.  You have persistent vomiting.  You have blood in your stool, or your stools are black and tarry.  You do not urinate in 6-8 hours, or there is only a small amount of very dark urine.  You have abdominal pain that increases or localizes.  You have weakness, dizziness, confusion, or light-headedness.  You have a severe headache.  Your diarrhea gets worse or does not get better.  You have a fever or persistent symptoms for more than 2-3 days.  You have a fever and your symptoms suddenly get worse. MAKE SURE YOU:   Understand these instructions.  Will watch your condition.  Will get help right away if you are not doing well or get worse.   This information is not intended to replace advice given to you by your health care provider. Make sure you discuss any questions you have with your health care provider.   Document Released: 01/03/2002 Document Revised: 02/03/2014 Document Reviewed: 09/21/2011 Elsevier Interactive Patient Education [employer].

## 2015-03-07 NOTE — ED Provider Notes (Signed)
CSN: [id number]     Arrival date & time 03/07/15  1046 History   First MD Initiated Contact with Patient 03/07/15 1143     Chief Complaint  Patient presents with  . Sickle Cell Pain Crisis  . Abdominal Pain  . Emesis  . Diarrhea     (Consider location/radiation/quality/duration/timing/severity/associated sxs/prior Treatment) HPI Comments: Gregory Bryant is a 40 y.o. male with a PMHx of sickle cell disease and DM2, who presents to the ED with complaints of gradual onset generalized abdominal pain that worsened last night. Patient describes the pain is 9/10 constant generalized nonradiating cramping pain that worsens with vomiting and has been unrelieved with Pepto-Bismol and Sprite. He has been unable to keep his home oxycodone down. He states that earlier in the week he was having more sickle cell pain, which improved with his home medications, but now that he is been unable to keep his medications down he is worried sickle cell pain may return. Associated symptoms include nausea, 12 episodes of nonbloody nonbilious emesis, and 12 episodes of nonbloody watery diarrhea over the last 24 hours. Positive sick contacts at home stating that his son was sick with similar symptoms last week.  He denies any fevers, chills, cough, URI symptoms, chest pain, shortness breath, constipation, melena, hematochezia, hematemesis, obstipation, rectal pain, dysuria, hematuria, testicular pain or swelling, penile discharge, numbness, tingling, weakness, recent travel, suspicious food intake, alcohol use, NSAID use, prior abdominal surgeries, or recent antibiotics.  He does not have frequent admissions for pain crises. He is managed by Dr. Noah Delaine at the [facility]. His regimen includes oxycodone 10 mg every 6 hours when necessary pain. He does not take folic acid or hydroxyurea.  Patient is a 40 y.o. male presenting with sickle cell pain, abdominal pain, vomiting, and diarrhea. The history is provided by  the patient and medical records. No language interpreter was used.  Sickle Cell Pain Crisis Location:  Abdomen Severity:  Moderate Onset quality:  Gradual Duration:  3 days Timing:  Constant Progression:  Worsening Chronicity:  New Frequency of attacks:  Rare History of pulmonary emboli: no   Context: not alcohol consumption   Relieved by:  Nothing Exacerbated by: vomiting. Ineffective treatments:  OTC medications and prescription drugs Associated symptoms: nausea and vomiting   Associated symptoms: no chest pain, no cough, no fever, no shortness of breath and no sore throat   Risk factors: no frequent admissions for pain, no frequent pain crises and no recent air travel   Abdominal Pain Associated symptoms: diarrhea, nausea and vomiting   Associated symptoms: no chest pain, no chills, no constipation, no cough, no dysuria, no fever, no hematuria, no shortness of breath and no sore throat   Emesis Associated symptoms: abdominal pain and diarrhea   Associated symptoms: no arthralgias, no chills, no myalgias and no sore throat   Diarrhea Associated symptoms: abdominal pain and vomiting   Associated symptoms: no arthralgias, no chills, no fever and no myalgias     Past Medical History  Diagnosis Date  . Sickle cell anemia (HCC)   . Diabetes mellitus without complication ([place])    History reviewed. No pertinent past surgical history. Family History  Problem Relation Age of Onset  . Diabetes Mother    Social History  Substance Use Topics  . Smoking status: Never Smoker   . Smokeless tobacco: Never Used  . Alcohol Use: No    Review of Systems  Constitutional: Negative for fever and chills.  HENT: Negative for ear  discharge, ear pain, rhinorrhea and sore throat.   Respiratory: Negative for cough and shortness of breath.   Cardiovascular: Negative for chest pain.  Gastrointestinal: Positive for nausea, vomiting, abdominal pain and diarrhea. Negative for constipation, blood  in stool and rectal pain.  Genitourinary: Negative for dysuria, hematuria, discharge, scrotal swelling and testicular pain.  Musculoskeletal: Negative for myalgias and arthralgias.  Skin: Negative for color change.  Allergic/Immunologic: Positive for immunocompromised state (diabetic).  Neurological: Negative for weakness and numbness.  Psychiatric/Behavioral: Negative for confusion.   10 Systems reviewed and are negative for acute change except as noted in the HPI.    Allergies  Aspirin and Sulfa antibiotics  Home Medications   Prior to Admission medications   Medication Sig Start Date End Date Taking? Authorizing Provider  albuterol (PROVENTIL HFA;VENTOLIN HFA) 108 (90 BASE) MCG/ACT inhaler Inhale 1-2 puffs into the lungs every 6 (six) hours as needed for wheezing or shortness of breath. 04/27/13  Yes Peter Dammen, PA-C  cetirizine-pseudoephedrine (ZYRTEC-D) 5-120 MG per tablet Take 1 tablet by mouth 2 (two) times daily. Patient taking differently: Take 1 tablet by mouth 2 (two) times daily as needed for allergies.  04/27/13  Yes Hazel Sams, PA-C  DM-Doxylamine-Acetaminophen (NYQUIL COLD & FLU PO) Take 30 mLs by mouth at bedtime as needed (cold/cough).   Yes Historical Provider, MD  metFORMIN (GLUCOPHAGE) 500 MG tablet Take 1 tablet (500 mg total) by mouth 2 (two) times daily with a meal. 09/20/14  Yes Shari Upstill, PA-C  Oxycodone HCl 10 MG TABS TAKE 1 TABLET BY MOUTH 4 TIMES A DAY AS NEEDED FOR PAIN 08/01/14  Yes Historical Provider, MD  guaiFENesin (MUCINEX) 600 MG 12 hr tablet Take 1 tablet (600 mg total) by mouth 2 (two) times daily. Patient not taking: Reported on 09/20/2014 04/27/13   Hazel Sams, PA-C   BP 125/90 mmHg  Pulse 95  Temp(Src) 98.2 F (36.8 C) (Oral)  Resp 20  SpO2 100% Physical Exam  Constitutional: He is oriented to person, place, and time. Vital signs are normal. He appears well-developed and well-nourished.  Non-toxic appearance. He appears distressed  (uncomfortable appearing).  Afebrile, nontoxic, uncomfortable appearing  HENT:  Head: Normocephalic and atraumatic.  Mouth/Throat: Oropharynx is clear and moist. Mucous membranes are dry.  Mildly dry mucous membranes  Eyes: Conjunctivae and EOM are normal. Right eye exhibits no discharge. Left eye exhibits no discharge.  Neck: Normal range of motion. Neck supple.  Cardiovascular: Normal rate, regular rhythm, normal heart sounds and intact distal pulses.  Exam reveals no gallop and no friction rub.   No murmur heard. Pulmonary/Chest: Effort normal and breath sounds normal. No respiratory distress. He has no decreased breath sounds. He has no wheezes. He has no rhonchi. He has no rales.  Abdominal: Soft. Normal appearance and bowel sounds are normal. He exhibits no distension. There is tenderness in the right lower quadrant. There is tenderness at McBurney's point. There is no rigidity, no rebound, no guarding, no CVA tenderness and negative Murphy's sign.    Soft, nondistended, +BS throughout, with focal RLQ TTP at mcburney's point, no r/g/r, neg murphy's, neg psoas sign, neg rovsing's, neg foot tap test, no CVA TTP   Musculoskeletal: Normal range of motion.  MAE x4 Strength and sensation grossly intact Distal pulses intact  Neurological: He is alert and oriented to person, place, and time. He has normal strength. No sensory deficit.  Skin: Skin is warm, dry and intact. No rash noted.  Psychiatric: He has a normal  mood and affect.  Nursing note and vitals reviewed.   ED Course  Procedures (including critical care time) Labs Review Labs Reviewed  CBC WITH DIFFERENTIAL/PLATELET - Abnormal; Notable for the following:    WBC 19.3 (*)    MCHC 36.4 (*)    RDW 16.6 (*)    Platelets 402 (*)    Neutro Abs 16.9 (*)    Monocytes Absolute 1.1 (*)    All other components within normal limits  COMPREHENSIVE METABOLIC PANEL - Abnormal; Notable for the following:    CO2 21 (*)    Glucose, Bld  140 (*)    Total Protein 9.5 (*)    Albumin 5.2 (*)    Total Bilirubin 6.0 (*)    All other components within normal limits  RETICULOCYTES - Abnormal; Notable for the following:    Retic Ct Pct 8.1 (*)    Retic Count, Manual 400.1 (*)    All other components within normal limits  CBG MONITORING, ED - Abnormal; Notable for the following:    Glucose-Capillary 110 (*)    All other components within normal limits  URINE CULTURE  LIPASE, BLOOD  URINALYSIS, ROUTINE W REFLEX MICROSCOPIC (NOT AT [facility])    Imaging Review Ct Abdomen Pelvis W Contrast  03/07/2015  CLINICAL DATA:  Right lower quadrant pain with nausea and vomiting for 3 days. History of sickle cell disease. EXAM: CT ABDOMEN AND PELVIS WITH CONTRAST TECHNIQUE: Multidetector CT imaging of the abdomen and pelvis was performed using the standard protocol following bolus administration of intravenous contrast. Oral contrast was also administered. CONTRAST:  138m OMNIPAQUE IOHEXOL 300 MG/ML SOLN, 511mOMNIPAQUE IOHEXOL 300 MG/ML SOLN COMPARISON:  None. FINDINGS: Lower chest:  Lung bases are clear. Hepatobiliary: No focal liver lesions are identified. There are multiple gallstones within the gallbladder. The gallbladder wall is not appreciably thickened. There is no biliary duct dilatation. Pancreas: No pancreatic mass or inflammatory focus. Spleen: Spleen is diminutive with increased attenuation, likely due to a degree of prior autoinfarction with the sickle cell disease. Adrenals/Urinary Tract: Adrenals appear normal bilaterally. There is a cyst in the medial upper pole left kidney measuring 1.4 x 1.0 cm. There is a cyst in the mid left kidney laterally measuring 1.3 x 1.2 cm. There is a second cyst in the mid left kidney measuring 1.1 x 1.0 cm. There is a cyst in the lower pole the left kidney measuring 1.3 x 1.3 cm. There is a tiny cyst in the inferior most aspect of the left kidney. There is slight scarring in the periphery of the right kidney.  There is no hydronephrosis on either side. There is no renal or ureteral calculus on either side. Urinary bladder is midline with wall thickness borderline increased. Stomach/Bowel: There are occasional sigmoid diverticula without diverticulitis. There is no bowel wall or mesenteric thickening. No bowel obstruction. No free air or portal venous air. Vascular/Lymphatic: There is no abdominal aortic aneurysm. Major mesenteric vessels appear patent. There is no demonstrable adenopathy in the abdomen or pelvis. Several smaller subcentimeter lymph nodes are noted in the right lower quadrant region. These lymph nodes do not meet size criteria for pathologic significance. Reproductive: Prostate and seminal vesicles appear normal in size and contour. There is no pelvic mass or pelvic fluid collection. Other: The appendix appears normal. There is no ascites or abscess in the abdomen or pelvis. Musculoskeletal: There is relative sclerosis in multiple bony structures consistent with the sickle cell disease. There are endplate infarcts at L2, L3,  L4, and L5, again consistent with sickle cell disease. Mixed sclerotic and lucent areas in the femoral heads raise concern for a degree of avascular necrosis bilaterally. No lytic or destructive lesions are identified. There are no intramuscular or abdominal wall lesions. IMPRESSION: Appendix appears normal. No bowel obstruction or evidence of bowel wall thickening. Several small lymph nodes in the right lower quadrant near the appendix are noted. Question a degree of mesenteric adenitis. By size criteria, there is no frank adenopathy in the abdomen or pelvis. Spleen is diminutive consistent with sickle cell disease. Multiple bony abnormalities as summarized above are also consistent with sickle cell disease. Cholelithiasis. No abscess.  No renal or ureteral calculus.  No hydronephrosis. 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 Electronically Signed    By: Lowella Grip III M.D.   On: 03/07/2015 14:41   I have personally reviewed and evaluated these images and lab results as part of my medical decision-making.   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 male here with abd pain, n/v/d x3 days, worsening last night. +Sick contacts at home. Sickle cell pt, unable to keep his home pain meds down. On exam, appears uncomfortable, TTP in RLQ without r/g/r, neg psoas sign. Although unlikely to be appendicitis given the duration of symptoms, would like to obtain CT to r/o this possibility, and exclude other causes such as diverticulitis vs colitis vs other etiologies, but likely viral gastroenteritis given the +sick contacts at home. Will also check retics and U/A, UCx. Will give pain meds, nausea meds, and fluids. Will reassess shortly.   12:30 PM Pharmacy calling regarding the morphine 0.465m/kg order from sickle cell order set. States it'll give pt 838mof morphine, asked if this is what I'd like to give. I said that would be fine. She called me back shortly after that and said she discussed it with the sickle cell clinic and they didn't feel that he needed all of that as a bolus dose, could split it up, especially since pt is not a frequent pain crisis pt. Discontinued the original dose and ordered 65m71morphine instead. Will repeat as needed. Labs and imaging still pending, pt has not yet been medicated due to issues with IV access. Will attempt to get this placed and then reassess shortly.   1:46 PM CBC w/diff showing leukocytosis (which has been seen in  the past, similar to prior values) with slight neutrophilic predominance. CMP with gluc 140 without anion gap or changes in bicarb. Bili 6.0 today which is similar to what it has been in the past. Lipase WNL. Retics elevated, higher than prior visit. U/A still not collected, CT not yet performed. Pt feeling much better, it appears that the 8mg15mse of morphine was accidentally given instead of the 65mg,46mt pt tolerating this well and feels much better. Nausea improved, tolerating the PO contrast well. Discussed with his nurse that if he has more pain, the 65mg d38m could be given at that time. Awaiting U/A and CT and then will reassess shortly  3:17 PM U/A clear. CT showing few  lymph nodes in RLQ c/w mesenteric adenitis which could be from a viral GI illness, which is likely the cause of his abd pain/n/v/d. Also shows cholelithiasis, no evidence of cholecystitis, and pt without RUQ tenderness or murphy's sign, nor does he have elevated AST/ALT/alk phos, therefore doubt this is contributory. Additional incidental finding of AVN of femoral heads bilaterally. Pt feeling much better, not requiring another dose of pain meds. Tolerating PO well, feels well to go home. Discussed staying hydrated, using zofran PRN nausea. Use home pain meds for pain. BRAT diet discussed. F/up with PCP in 1-2 days for ongoing management of sickle cell pain and recheck of symptoms. I explained the diagnosis and have given explicit precautions to return to the ER including for any other new or worsening symptoms. The patient understands and accepts the medical plan as it's been dictated and I have answered their questions. Discharge instructions concerning home care and prescriptions have been given. The patient is STABLE and is discharged to home in good condition.  BP 138/79 mmHg  Pulse 95  Temp(Src) 98.2 F (36.8 C) (Oral)  Resp 18  Wt 79.379 kg  SpO2 98%  Meds ordered this encounter  Medications  . DISCONTD: morphine 4 MG/ML  injection 0.1 mg/kg    Sig:   . DISCONTD: morphine 4 MG/ML injection 0.1 mg/kg    Sig:   . ondansetron (ZOFRAN) injection 4 mg    Sig:   . sodium chloride 0.9 % bolus 1,000 mL    Sig:   . DISCONTD: morphine 4 MG/ML injection 7.9 mg    Sig:   . morphine 4 MG/ML injection 4 mg    Sig:   . iohexol (OMNIPAQUE) 300 MG/ML solution 50 mL    Sig:   . iohexol (OMNIPAQUE) 300 MG/ML solution 100 mL    Sig:   . ondansetron (ZOFRAN ODT) 8 MG disintegrating tablet    Sig: Take 1 tablet (8 mg total) by mouth every 8 (eight) hours as needed for nausea or vomiting.    Dispense:  20 tablet    Refill:  0    Order Specific Question:  Supervising Provider    Answer:  Noemi Chapel [[id number]]     Adelai Achey Camprubi-Soms, PA-C 03/07/15 [place], DO 03/07/15 1535

## 2015-03-07 NOTE — ED Notes (Signed)
Patient reports increased abdominal pain, vomiting, and diarrhea x 3 days. Patient has sickle cell. Patient states he has been out of pain meds, but was prescribed some yesterday, but is unable to keep down.

## 2015-03-07 NOTE — ED Notes (Signed)
I have attempted IV and u/s guided IV with no success.  My colleague is attempting u/s guided IV as I write this.  Pt. Is in no distress.

## 2015-03-08 LAB — URINE CULTURE: CULTURE: NO GROWTH

## 2016-02-03 ENCOUNTER — Emergency Department (HOSPITAL_COMMUNITY)
Admission: EM | Admit: 2016-02-03 | Discharge: 2016-02-04 | Disposition: A | Discharge status: Home / Self Care | Payer: Medicaid Other | Attending: Emergency Medicine | Admitting: Emergency Medicine

## 2016-02-03 ENCOUNTER — Encounter (HOSPITAL_COMMUNITY): Payer: Self-pay

## 2016-02-03 DIAGNOSIS — Z79899 Other long term (current) drug therapy: Secondary | ICD-10-CM | POA: Insufficient documentation

## 2016-02-03 DIAGNOSIS — Z7984 Long term (current) use of oral hypoglycemic drugs: Secondary | ICD-10-CM | POA: Diagnosis not present

## 2016-02-03 DIAGNOSIS — D5700 Hb-SS disease with crisis, unspecified: Secondary | ICD-10-CM | POA: Insufficient documentation

## 2016-02-03 DIAGNOSIS — E119 Type 2 diabetes mellitus without complications: Secondary | ICD-10-CM | POA: Insufficient documentation

## 2016-02-03 MED ORDER — HYDROMORPHONE HCL 1 MG/ML IJ SOLN
0.5000 mg | Freq: Once | INTRAMUSCULAR | Status: AC
  Administered 2016-02-04: 0.5 mg via SUBCUTANEOUS
  Filled 2016-02-03: qty 1

## 2016-02-03 NOTE — ED Triage Notes (Signed)
Patient c/o pain in lower back and bilateral legs.  Patient states this is the same pain he experiences when he is in crisis.  Patient denies chest pain, denies SOB.

## 2016-02-04 LAB — CBC WITH DIFFERENTIAL/PLATELET
BASOS ABS: 0.1 10*3/uL (ref 0.0–0.1)
BASOS PCT: 1 %
Eosinophils Absolute: 0.6 10*3/uL (ref 0.0–0.7)
Eosinophils Relative: 3 %
HEMATOCRIT: 33.8 % — AB (ref 39.0–52.0)
HEMOGLOBIN: 12.2 g/dL — AB (ref 13.0–17.0)
Lymphocytes Relative: 23 %
Lymphs Abs: 4.9 10*3/uL — ABNORMAL HIGH (ref 0.7–4.0)
MCH: 28.2 pg (ref 26.0–34.0)
MCHC: 36.1 g/dL — ABNORMAL HIGH (ref 30.0–36.0)
MCV: 78.2 fL (ref 78.0–100.0)
MONOS PCT: 7 %
Monocytes Absolute: 1.6 10*3/uL — ABNORMAL HIGH (ref 0.1–1.0)
NEUTROS ABS: 14.3 10*3/uL — AB (ref 1.7–7.7)
NEUTROS PCT: 66 %
Platelets: 415 10*3/uL — ABNORMAL HIGH (ref 150–400)
RBC: 4.32 MIL/uL (ref 4.22–5.81)
RDW: 15.9 % — ABNORMAL HIGH (ref 11.5–15.5)
WBC: 21.5 10*3/uL — ABNORMAL HIGH (ref 4.0–10.5)

## 2016-02-04 LAB — COMPREHENSIVE METABOLIC PANEL
ALK PHOS: 78 U/L (ref 38–126)
ALT: 27 U/L (ref 17–63)
ANION GAP: 7 (ref 5–15)
AST: 31 U/L (ref 15–41)
Albumin: 4.7 g/dL (ref 3.5–5.0)
BUN: 10 mg/dL (ref 6–20)
CALCIUM: 9.6 mg/dL (ref 8.9–10.3)
CO2: 25 mmol/L (ref 22–32)
Chloride: 101 mmol/L (ref 101–111)
Creatinine, Ser: 0.8 mg/dL (ref 0.61–1.24)
GFR calc non Af Amer: >60 mL/min (ref >60)
Glucose, Bld: 146 mg/dL — ABNORMAL HIGH (ref 65–99)
POTASSIUM: 3.8 mmol/L (ref 3.5–5.1)
SODIUM: 133 mmol/L — AB (ref 135–145)
Total Bilirubin: 3 mg/dL — ABNORMAL HIGH (ref 0.3–1.2)
Total Protein: 8.2 g/dL — ABNORMAL HIGH (ref 6.5–8.1)

## 2016-02-04 LAB — RETICULOCYTES
RBC.: 4.32 MIL/uL (ref 4.22–5.81)
RETIC COUNT ABSOLUTE: 414.7 10*3/uL — AB (ref 19.0–186.0)
Retic Ct Pct: 9.6 % — ABNORMAL HIGH (ref 0.4–3.1)

## 2016-02-04 MED ORDER — HYDROMORPHONE HCL 2 MG/ML IJ SOLN: 2.0000 mg | INTRAMUSCULAR | Status: DC

## 2016-02-04 MED ORDER — HYDROMORPHONE HCL 2 MG/ML IJ SOLN
2.0000 mg | INTRAMUSCULAR | Status: AC
  Filled 2016-02-04: qty 1

## 2016-02-04 MED ORDER — HYDROMORPHONE HCL 2 MG/ML IJ SOLN
2.0000 mg | INTRAMUSCULAR | Status: AC
  Administered 2016-02-04: 2 mg via INTRAVENOUS
  Filled 2016-02-04: qty 1

## 2016-02-04 MED ORDER — ONDANSETRON HCL 4 MG/2ML IJ SOLN
4.0000 mg | INTRAMUSCULAR | Status: DC | PRN
  Administered 2016-02-04: 4 mg via INTRAVENOUS
  Filled 2016-02-04: qty 2

## 2016-02-04 MED ORDER — HYDROMORPHONE HCL 2 MG/ML IJ SOLN: 2.0000 mg | INTRAMUSCULAR | Status: AC

## 2016-02-04 MED ORDER — SODIUM CHLORIDE 0.45 % IV SOLN
INTRAVENOUS | Status: DC
  Administered 2016-02-04: 01:00:00 via INTRAVENOUS

## 2016-02-04 MED ORDER — DIPHENHYDRAMINE HCL 50 MG/ML IJ SOLN
25.0000 mg | Freq: Once | INTRAMUSCULAR | Status: AC
  Administered 2016-02-04: 25 mg via INTRAVENOUS
  Filled 2016-02-04: qty 1

## 2016-02-04 NOTE — ED Provider Notes (Signed)
[facility] DEPT Provider Note   CSN: [id number] Arrival date & time: 02/03/16  2333   By signing my name below, I, Gregory Bryant, attest that this documentation has been prepared under the direction and in the presence of Orpah Greek, MD. Electronically signed, Gregory Bryant, ED Scribe. 02/04/16. 12:54 AM.   History   Chief Complaint Chief Complaint  Patient presents with  . Sickle Cell Pain Crisis   The history is provided by the patient, medical records and a significant other. No language interpreter was used.    HPI Comments: Gregory Bryant is a 41 y.o. male with Hx of sickle cell anemia and DM who presents to the Emergency Department complaining of back an leg pain x ~8-9 hours. Significant other notes pt taking prescribed pain medications at home without relief. Denies fever, chest pain, SOB or subjective flu-related symptoms.  Past Medical History:  Diagnosis Date  . Diabetes mellitus without complication (Gregory Bryant)   . Sickle cell anemia (HCC)     There are no active problems to display for this patient.   History reviewed. No pertinent surgical history.     Home Medications    Prior to Admission medications   Medication Sig Start Date End Date Taking? Authorizing Provider  albuterol (PROVENTIL HFA;VENTOLIN HFA) 108 (90 BASE) MCG/ACT inhaler Inhale 1-2 puffs into the lungs every 6 (six) hours as needed for wheezing or shortness of breath. 04/27/13   Hazel Sams, PA-C  cetirizine-pseudoephedrine (ZYRTEC-D) 5-120 MG per tablet Take 1 tablet by mouth 2 (two) times daily. Patient taking differently: Take 1 tablet by mouth 2 (two) times daily as needed for allergies.  04/27/13   Hazel Sams, PA-C  DM-Doxylamine-Acetaminophen (NYQUIL COLD & FLU PO) Take 30 mLs by mouth at bedtime as needed (cold/cough).    Historical Provider, MD  guaiFENesin (MUCINEX) 600 MG 12 hr tablet Take 1 tablet (600 mg total) by mouth 2 (two) times daily. Patient not taking: Reported  on 09/20/2014 04/27/13   Hazel Sams, PA-C  metFORMIN (GLUCOPHAGE) 500 MG tablet Take 1 tablet (500 mg total) by mouth 2 (two) times daily with a meal. 09/20/14   Charlann Lange, PA-C  ondansetron (ZOFRAN ODT) 8 MG disintegrating tablet Take 1 tablet (8 mg total) by mouth every 8 (eight) hours as needed for nausea or vomiting. 03/07/15   Mercedes Camprubi-Soms, PA-C  Oxycodone HCl 10 MG TABS TAKE 1 TABLET BY MOUTH 4 TIMES A DAY AS NEEDED FOR PAIN 08/01/14   Historical Provider, MD    Family History Family History  Problem Relation Age of Onset  . Diabetes Mother     Social History Social History  Substance Use Topics  . Smoking status: Never Smoker  . Smokeless tobacco: Never Used  . Alcohol use No     Allergies   Aspirin and Sulfa antibiotics   Review of Systems Review of Systems  All other systems reviewed and are negative.  A complete 10 system review of systems was obtained and all systems are negative except as noted in the HPI and PMH.    Physical Exam Updated Vital Signs BP 133/81   Pulse 78   Temp 99.2 F (37.3 C) (Oral)   Resp 18   SpO2 100%   Physical Exam  Constitutional: He is oriented to person, place, and time. He appears well-developed and well-nourished. He appears distressed.  HENT:  Head: Normocephalic and atraumatic.  Right Ear: Hearing normal.  Left Ear: Hearing normal.  Nose: Nose normal.  Mouth/Throat: Oropharynx  is clear and moist and mucous membranes are normal.  Eyes: Conjunctivae and EOM are normal. Pupils are equal, round, and reactive to light.  Neck: Normal range of motion. Neck supple.  Cardiovascular: Regular rhythm, S1 normal and S2 normal.  Exam reveals no gallop and no friction rub.   No murmur heard. Pulmonary/Chest: Effort normal and breath sounds normal. No respiratory distress. He exhibits no tenderness.  Abdominal: Soft. Normal appearance and bowel sounds are normal. There is no hepatosplenomegaly. There is no tenderness. There is no  rebound, no guarding, no tenderness at McBurney's point and negative Murphy's sign. No hernia.  Musculoskeletal: Normal range of motion.  Neurological: He is alert and oriented to person, place, and time. He has normal strength. No cranial nerve deficit or sensory deficit. Coordination normal. GCS eye subscore is 4. GCS verbal subscore is 5. GCS motor subscore is 6.  Skin: Skin is warm, dry and intact. No rash noted. No cyanosis.  Psychiatric: He has a normal mood and affect. His speech is normal and behavior is normal. Thought content normal.  Nursing note and vitals reviewed.    ED Treatments / Results  DIAGNOSTIC STUDIES: Oxygen Saturation is 100% on RA, normal by my interpretation.    COORDINATION OF CARE: 12:54 AM Discussed treatment plan with pt at bedside and pt agreed to plan.  Labs (all labs ordered are listed, but only abnormal results are displayed) Labs Reviewed  COMPREHENSIVE METABOLIC PANEL - Abnormal; Notable for the following:       Result Value   Sodium 133 (*)    Glucose, Bld 146 (*)    Total Protein 8.2 (*)    Total Bilirubin 3.0 (*)    All other components within normal limits  CBC WITH DIFFERENTIAL/PLATELET - Abnormal; Notable for the following:    WBC 21.5 (*)    Hemoglobin 12.2 (*)    HCT 33.8 (*)    MCHC 36.1 (*)    RDW 15.9 (*)    Platelets 415 (*)    Neutro Abs 14.3 (*)    Lymphs Abs 4.9 (*)    Monocytes Absolute 1.6 (*)    All other components within normal limits  RETICULOCYTES - Abnormal; Notable for the following:    Retic Ct Pct 9.6 (*)    Retic Count, Manual 414.7 (*)    All other components within normal limits    EKG  EKG Interpretation None       Radiology No results found.  Procedures Procedures (including critical care time)  Medications Ordered in ED Medications  0.45 % sodium chloride infusion ( Intravenous New Bag/Given 02/04/16 0043)  HYDROmorphone (DILAUDID) injection 2 mg (not administered)    Or  HYDROmorphone  (DILAUDID) injection 2 mg (not administered)  HYDROmorphone (DILAUDID) injection 2 mg (not administered)    Or  HYDROmorphone (DILAUDID) injection 2 mg (not administered)  HYDROmorphone (DILAUDID) injection 2 mg (not administered)    Or  HYDROmorphone (DILAUDID) injection 2 mg (not administered)  ondansetron (ZOFRAN) injection 4 mg (4 mg Intravenous Given 02/04/16 0044)  HYDROmorphone (DILAUDID) injection 0.5 mg (0.5 mg Subcutaneous Given 02/04/16 0011)  HYDROmorphone (DILAUDID) injection 2 mg (2 mg Intravenous Given 02/04/16 0044)    Or  HYDROmorphone (DILAUDID) injection 2 mg ( Subcutaneous See Alternative 02/04/16 0044)  diphenhydrAMINE (BENADRYL) injection 25 mg (25 mg Intravenous Given 02/04/16 0044)     Initial Impression / Assessment and Plan / ED Course  I have reviewed the triage vital signs and the nursing notes.  Pertinent labs & imaging results that were available during my care of the patient were reviewed by me and considered in my medical decision making (see chart for details).  Will order pain medications.  Clinical Course   Patient presents with typical sickle cell pain. Patient reports onset of pain after being exposed to the cold. He did take his home medications without relief. He is not experiencing any chest pain, shortness of breath. He has not had fever.  Patient administered IV analgesia and has had significant improvement. He feels well enough for discharge, will follow-up with his primary care physician.   Final Clinical Impressions(s) / ED Diagnoses   Final diagnoses:  Sickle cell crisis ([place])    New Prescriptions New Prescriptions   No medications on file  I personally performed the services described in this documentation, which was scribed in my presence. The recorded information has been reviewed and is accurate.    Orpah Greek, MD 02/04/16 [id number]

## 2016-02-04 NOTE — ED Notes (Signed)
Pt has refused 2nd dose of Dilaudid IV at this time--- stated, "I don't need it--- my pain has gone down a lot".

## 2017-02-07 DIAGNOSIS — E119 Type 2 diabetes mellitus without complications: Secondary | ICD-10-CM | POA: Insufficient documentation

## 2017-02-07 DIAGNOSIS — D5720 Sickle-cell/Hb-C disease without crisis: Secondary | ICD-10-CM | POA: Insufficient documentation

## 2017-02-07 DIAGNOSIS — Q8901 Asplenia (congenital): Secondary | ICD-10-CM | POA: Insufficient documentation

## 2017-03-30 DIAGNOSIS — M8700 Idiopathic aseptic necrosis of unspecified bone: Secondary | ICD-10-CM | POA: Insufficient documentation

## 2017-03-30 DIAGNOSIS — M87029 Idiopathic aseptic necrosis of unspecified humerus: Secondary | ICD-10-CM

## 2017-03-30 DIAGNOSIS — J4520 Mild intermittent asthma, uncomplicated: Secondary | ICD-10-CM | POA: Insufficient documentation

## 2017-03-30 DIAGNOSIS — G8929 Other chronic pain: Secondary | ICD-10-CM | POA: Insufficient documentation

## 2017-03-30 DIAGNOSIS — M7918 Myalgia, other site: Secondary | ICD-10-CM

## 2017-03-30 HISTORY — DX: Idiopathic aseptic necrosis of unspecified humerus: M87.029

## 2017-04-10 MED ORDER — INSULIN LISPRO 100 UNIT/ML ~~LOC~~ SOLN
1.00 | SUBCUTANEOUS | Status: DC
Start: 2017-04-11 — End: 2017-04-10

## 2017-04-10 MED ORDER — FOLIC ACID 1 MG PO TABS
1.00 | ORAL_TABLET | ORAL | Status: DC
Start: 2017-04-11 — End: 2017-04-10

## 2017-04-10 MED ORDER — OXYCODONE HCL 5 MG PO TABS
2.50 | ORAL_TABLET | ORAL | Status: DC
Start: ? — End: 2017-04-10

## 2017-04-10 MED ORDER — ONDANSETRON 4 MG PO TBDP
4.00 | ORAL_TABLET | ORAL | Status: DC
Start: ? — End: 2017-04-10

## 2017-04-10 MED ORDER — DEXTROSE 10 % IV SOLN
125.00 | INTRAVENOUS | Status: DC
Start: ? — End: 2017-04-10

## 2017-04-10 MED ORDER — HYDROMORPHONE HCL 1 MG/ML IJ SOLN
0.50 | INTRAMUSCULAR | Status: DC
Start: ? — End: 2017-04-10

## 2017-04-10 MED ORDER — DIPHENHYDRAMINE HCL 25 MG PO CAPS
25.00 | ORAL_CAPSULE | ORAL | Status: DC
Start: ? — End: 2017-04-10

## 2017-07-22 DIAGNOSIS — D5700 Hb-SS disease with crisis, unspecified: Secondary | ICD-10-CM | POA: Insufficient documentation

## 2017-07-23 MED ORDER — GLIPIZIDE ER 2.5 MG PO TB24
10.00 | ORAL_TABLET | ORAL | Status: DC
Start: 2017-07-23 — End: 2017-07-23

## 2017-07-23 MED ORDER — FOLIC ACID 1 MG PO TABS
1.00 | ORAL_TABLET | ORAL | Status: DC
Start: 2017-07-23 — End: 2017-07-23

## 2017-07-23 MED ORDER — OXYCODONE HCL 5 MG PO TABS
20.00 | ORAL_TABLET | ORAL | Status: DC
Start: ? — End: 2017-07-23

## 2017-07-23 MED ORDER — ENOXAPARIN SODIUM 40 MG/0.4ML ~~LOC~~ SOLN
40.00 | SUBCUTANEOUS | Status: DC
Start: 2017-07-22 — End: 2017-07-23

## 2017-07-23 MED ORDER — DEXTROSE-NACL 5-0.45 % IV SOLN
INTRAVENOUS | Status: DC
Start: ? — End: 2017-07-23

## 2017-07-29 ENCOUNTER — Ambulatory Visit (INDEPENDENT_AMBULATORY_CARE_PROVIDER_SITE_OTHER): Payer: Self-pay | Admitting: Family Medicine

## 2017-07-29 ENCOUNTER — Encounter: Payer: Self-pay | Admitting: Family Medicine

## 2017-07-29 VITALS — BP 135/85 | HR 68 | Temp 98.6°F | Resp 16 | Ht 66.0 in | Wt 162.0 lb

## 2017-07-29 DIAGNOSIS — E119 Type 2 diabetes mellitus without complications: Secondary | ICD-10-CM

## 2017-07-29 DIAGNOSIS — G8929 Other chronic pain: Secondary | ICD-10-CM

## 2017-07-29 DIAGNOSIS — M7918 Myalgia, other site: Secondary | ICD-10-CM

## 2017-07-29 DIAGNOSIS — M8700 Idiopathic aseptic necrosis of unspecified bone: Secondary | ICD-10-CM

## 2017-07-29 DIAGNOSIS — M87029 Idiopathic aseptic necrosis of unspecified humerus: Secondary | ICD-10-CM

## 2017-07-29 DIAGNOSIS — D5700 Hb-SS disease with crisis, unspecified: Secondary | ICD-10-CM

## 2017-07-29 LAB — POCT URINALYSIS DIPSTICK
Bilirubin, UA: NEGATIVE
Blood, UA: NEGATIVE
Glucose, UA: NEGATIVE
Ketones, UA: NEGATIVE
Leukocytes, UA: NEGATIVE
Nitrite, UA: NEGATIVE
Protein, UA: NEGATIVE
Spec Grav, UA: 1.01 (ref 1.010–1.025)
Urobilinogen, UA: 0.2 E.U./dL
pH, UA: 5.5 (ref 5.0–8.0)

## 2017-07-29 NOTE — Progress Notes (Signed)
   Subjective:    Patient ID: Gregory Bryant, male    DOB: 28-Mar-1975, 42 y.o.   MRN: [id number] Chief Complaint  Patient presents with  . Establish Care  . Sickle Cell Anemia    Patient presents to establish care. States that he sees Hematology. Has an appointment for an eye exam this month. Patient states that he has [place] disease. Patient states that he had pain medication last prescribed in May. Was given dilaudid in the ED with relief. Was also given fent IV without relief.  Last crisis was July 21, 2017.  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 Surgery pending.      Review of Systems  Constitutional: Negative.   HENT: Negative.   Eyes: Negative.   Respiratory: Negative.   Cardiovascular: Negative.   Gastrointestinal: Negative.   Musculoskeletal: Positive for arthralgias (Patient with bilateral shoulder pain. ).  Neurological: Negative.   Hematological:       Sickle Cell Disease type [place].   Psychiatric/Behavioral: Negative.        Objective:   Physical Exam  Constitutional: He is oriented to person, place, and time. He appears well-developed and well-nourished.  HENT:  Head: Normocephalic and atraumatic.  Left Ear: External ear normal.  Nose: Nose normal.  Mouth/Throat: Oropharynx is clear and moist. No oropharyngeal exudate.  Eyes: Pupils are equal, round, and reactive to light. Conjunctivae and EOM are normal.  Neck: Normal range of motion. Neck supple. No JVD present. No thyromegaly present.  Cardiovascular: Normal rate, regular rhythm, normal heart sounds and intact distal pulses. Exam reveals no gallop and no friction rub.  No murmur heard. Pulmonary/Chest: Effort normal and breath sounds normal.  Abdominal: Soft. Bowel sounds are normal. He exhibits no distension. There is no tenderness.  Musculoskeletal:  Limited ROM due to pain of bilateral shoulders.   Neurological: He is alert and oriented to person, place, and time.  Skin: Skin  is warm and dry.  Hyperpigmentation of bilateral upper extremities.   Psychiatric: He has a normal mood and affect. His behavior is normal. Judgment and thought content normal.  Vitals reviewed.   BP 135/85 (BP Location: Right Arm, Patient Position: Sitting, Cuff Size: Large)   Pulse 68   Temp 98.6 F (37 C) (Oral)   Resp 16   Ht 5\' 6"  (1.676 m)   Wt 162 lb (73.5 kg)   SpO2 97%   BMI 26.15 kg/m          Assessment & Plan:  1. Avascular necrosis ([place]) - Ambulatory referral to Orthopedic Surgery  2. Sickle cell anemia with pain (HCC) - CBC with Differential - Comprehensive metabolic panel - Vitamin D, 25-hydroxy - Reticulocytes - Ferritin - [id number]+OXYCODONE+CRT-UNBUND - Urinalysis Dipstick - Ambulatory referral to Orthopedic Surgery - Fructosamine  3. Type 2 diabetes mellitus without complication, without long-term current use of insulin (HCC) Continue with current medications.  - Fructosamine level ordered.   4. Avascular necrosis of head of humerus, unspecified laterality ([place]) Review of Xray: bilateral shoulders 1.No acute fracture or malalignment.  2.Patchy sclerosis within the articular surface of the humeral head is compatible with AVN. No subchondral collapse.   2. Sickle cell anemia with pain (HCC) UDS today. Will prescribe pending UDS.  Lab work  5. Chronic musculoskeletal pain UDS today.

## 2017-07-29 NOTE — Patient Instructions (Signed)
Lab work today. Will call you with the results. I am putting in a referral to ortho for your shoulder surgery. Once I receive the drug screen results, I will send your medication to the pharmacy.  Avascular Necrosis Avascular necrosis is a disease resulting from the temporary or permanent loss of blood supply to a bone. This disease may also be known as:  Osteonecrosis.  Aseptic necrosis.  Ischemic bone necrosis.  Without proper blood supply, the internal layer of the affected bone dies and the outer layer of the bone may break down. If this process affects a bone near a joint, it may lead to collapse of that joint. Common bones that are affected by this condition include:  The top of your thigh bone (femoral head).  One or more bones in your wrist (scaphoid orlunate).  One or more bones in your foot (metatarsals).  One of the bones in your ankle (navicular).  The joint most commonly affected by this condition is the hip joint. Avascular necrosis rarely occurs in more than one bone at a time. What are the causes?  Damage or injury to a bone or joint.  Using corticosteroid medicine for a long period of time.  Changes in your immune or hormone systems.  Excessive exposure to radiation. What increases the risk?  Alcohol abuse.  Previous traumatic injury to a joint.  Using corticosteroid medicines for a long period of time or often.  Having a medical condition such as: ? HIV or AIDS. ? Diabetes. ? Sickle cell disease. ? An autoimmune disease. What are the signs or symptoms? The main symptoms of avascular necrosis are pain and decreased motion in the affected bone or joint. In the early stages the pain may be minor and occur only with activity. As avascular necrosis progresses, pain may gradually worsen and occur while at rest. The pain may suddenly become severe if an affected joint collapses. How is this diagnosed? Avascular necrosis may be diagnosed with:  A medical  history.  A physical exam.  X-rays.  An MRI.  A bone scan.  How is this treated? Treatments may include:  Medicine to help relieve pain.  Avoiding placing any pressure or weight ontheaffected area. If avascular necrosis occurs in your hip, ankle, or foot, you may be instructed to use crutches or a rolling scooter.  Surgery, such as: ? Core decompression. In this surgery, one or more holes are placed in the bone for new blood vessels to grow into. This provides a renewed blood supply to the bone. Core decompression can often reduce pain and pressure in the affected bone and slow the progression of bone and joint destruction. ? Osteotomy. In this surgery, the bone is reshaped to reduce stress on the affected area of the joint. ? Bone grafting. In this surgery, healthy bone from one part of your body is transplanted to the affected area. ? Arthroplasty. Arthroplasty is also known as total joint replacement. In this surgery, the affected surface on one or both sides of a joint is replaced with artificial parts (prostheses).  Electrical stimulation. This may help encourage new bone growth.  Follow these instructions at home:  Take medicines only as directed by your health care provider.  Follow your health care provider's recommendations on limiting activities or using crutches to rest your affected joint.  Meet with aphysical therapist as directed by your health care provider.  Keep all follow-up visits as directed by your health care provider. This is important. Contact a health  care provider if:  Your pain worsens.  You have decreased motion in your affected joint. Get help right away if: Your pain suddenly becomes severe. This information is not intended to replace advice given to you by your health care provider. Make sure you discuss any questions you have with your health care provider. Document Released: 07/05/2001 Document Revised: 06/21/2015 Document Reviewed:  03/23/2013 Elsevier Interactive Patient Education  2018 [employer].  Sickle Cell Anemia, Adult Sickle cell anemia is a condition where your red blood cells are shaped like sickles. Red blood cells carry oxygen through the body. Sickle-shaped red blood cells do not live as long as normal red blood cells. They also clump together and block blood from flowing through the blood vessels. These things prevent the body from getting enough oxygen. Sickle cell anemia causes organ damage and pain. It also increases the risk of infection. Follow these instructions at home:  Drink enough fluid to keep your pee (urine) clear or pale yellow. Drink more in hot weather and during exercise.  Do not smoke. Smoking lowers oxygen levels in the blood.  Only take over-the-counter or prescription medicines as told by your doctor.  Take antibiotic medicines as told by your doctor. Make sure you finish them even if you start to feel better.  Take supplements as told by your doctor.  Consider wearing a medical alert bracelet. This tells anyone caring for you in an emergency of your condition.  When traveling, keep your medical information, doctors' names, and the medicines you take with you at all times.  If you have a fever, do not take fever medicines right away. This could cover up a problem. Tell your doctor.  Keep all follow-up visits with your doctor. Sickle cell anemia requires regular medical care. Contact a doctor if: You have a fever. Get help right away if:  You feel dizzy or faint.  You have new belly (abdominal) pain, especially on the left side near the stomach area.  You have a lasting, often uncomfortable and painful erection of the penis (priapism). If it is not treated right away, you will become unable to have sex (impotence).  You have numbness in your arms or legs or you have a hard time moving them.  You have a hard time talking.  You have a fever or lasting symptoms for more  than 2-3 days.  You have a fever and your symptoms suddenly get worse.  You have signs or symptoms of infection. These include: ? Chills. ? Being more tired than normal (lethargy). ? Irritability. ? Poor eating. ? Throwing up (vomiting).  You have pain that is not helped with medicine.  You have shortness of breath.  You have pain in your chest.  You are coughing up pus-like or bloody mucus.  You have a stiff neck.  Your feet or hands swell or have pain.  Your belly looks bloated.  Your joints hurt. This information is not intended to replace advice given to you by your health care provider. Make sure you discuss any questions you have with your health care provider. Document Released: 11/03/2012 Document Revised: 06/21/2015 Document Reviewed: 08/25/2012 Elsevier Interactive Patient Education  2017 [employer].

## 2017-07-30 LAB — CBC WITH DIFFERENTIAL/PLATELET
Basophils Absolute: 0.2 10*3/uL (ref 0.0–0.2)
Basos: 1 %
EOS (ABSOLUTE): 1.2 10*3/uL — ABNORMAL HIGH (ref 0.0–0.4)
Eos: 7 %
Hematocrit: 35.4 % — ABNORMAL LOW (ref 37.5–51.0)
Hemoglobin: 11.5 g/dL — ABNORMAL LOW (ref 13.0–17.7)
Immature Grans (Abs): 0.4 10*3/uL — ABNORMAL HIGH (ref 0.0–0.1)
Immature Granulocytes: 3 %
Lymphocytes Absolute: 4.6 10*3/uL — ABNORMAL HIGH (ref 0.7–3.1)
Lymphs: 27 %
MCH: 28.6 pg (ref 26.6–33.0)
MCHC: 32.5 g/dL (ref 31.5–35.7)
MCV: 88 fL (ref 79–97)
Monocytes Absolute: 1 10*3/uL — ABNORMAL HIGH (ref 0.1–0.9)
Monocytes: 6 %
NRBC: 3 % — ABNORMAL HIGH (ref 0–0)
Neutrophils Absolute: 9.7 10*3/uL — ABNORMAL HIGH (ref 1.4–7.0)
Neutrophils: 56 %
Platelets: 508 10*3/uL — ABNORMAL HIGH (ref 150–450)
RBC: 4.02 x10E6/uL — ABNORMAL LOW (ref 4.14–5.80)
RDW: 15.9 % — ABNORMAL HIGH (ref 12.3–15.4)
WBC: 17 10*3/uL — ABNORMAL HIGH (ref 3.4–10.8)

## 2017-07-30 LAB — COMPREHENSIVE METABOLIC PANEL
ALT: 54 IU/L — ABNORMAL HIGH (ref 0–44)
AST: 25 IU/L (ref 0–40)
Albumin/Globulin Ratio: 1.4 (ref 1.2–2.2)
Albumin: 4.6 g/dL (ref 3.5–5.5)
Alkaline Phosphatase: 141 IU/L — ABNORMAL HIGH (ref 39–117)
BUN/Creatinine Ratio: 11 (ref 9–20)
BUN: 9 mg/dL (ref 6–24)
Bilirubin Total: 1.9 mg/dL — ABNORMAL HIGH (ref 0.0–1.2)
CO2: 22 mmol/L (ref 20–29)
Calcium: 9.8 mg/dL (ref 8.7–10.2)
Chloride: 103 mmol/L (ref 96–106)
Creatinine, Ser: 0.82 mg/dL (ref 0.76–1.27)
GFR calc Af Amer: 126 mL/min/{1.73_m2} (ref >59)
GFR calc non Af Amer: 109 mL/min/{1.73_m2} (ref >59)
Globulin, Total: 3.3 g/dL (ref 1.5–4.5)
Glucose: 126 mg/dL — ABNORMAL HIGH (ref 65–99)
Potassium: 4.5 mmol/L (ref 3.5–5.2)
Sodium: 139 mmol/L (ref 134–144)
Total Protein: 7.9 g/dL (ref 6.0–8.5)

## 2017-07-30 LAB — MED LIST OPTION NOT SELECTED

## 2017-07-30 LAB — RETICULOCYTES: Retic Ct Pct: 7 % — ABNORMAL HIGH (ref 0.6–2.6)

## 2017-07-30 LAB — VITAMIN D 25 HYDROXY (VIT D DEFICIENCY, FRACTURES): Vit D, 25-Hydroxy: 18.5 ng/mL — ABNORMAL LOW (ref 30.0–100.0)

## 2017-07-30 LAB — FERRITIN: Ferritin: 1719 ng/mL — ABNORMAL HIGH (ref 30–400)

## 2017-07-31 LAB — 737588 9+OXYCODONE+CRT-UNBUND
Amphetamine Scrn, Ur: NEGATIVE ng/mL
BARBITURATE SCREEN URINE: NEGATIVE ng/mL
BENZODIAZEPINE SCREEN, URINE: NEGATIVE ng/mL
CANNABINOIDS UR QL SCN: NEGATIVE ng/mL
Cocaine (Metab) Scrn, Ur: NEGATIVE ng/mL
Creatinine(Crt), U: 82.2 mg/dL (ref 20.0–300.0)
Methadone Screen, Urine: NEGATIVE ng/mL
OXYCODONE+OXYMORPHONE UR QL SCN: NEGATIVE ng/mL
Opiate Scrn, Ur: NEGATIVE ng/mL
Ph of Urine: 5.8 (ref 4.5–8.9)
Phencyclidine Qn, Ur: NEGATIVE ng/mL
Propoxyphene Scrn, Ur: NEGATIVE ng/mL

## 2017-08-01 LAB — SPECIMEN STATUS REPORT

## 2017-08-01 LAB — FRUCTOSAMINE: Fructosamine: 329 umol/L — ABNORMAL HIGH (ref 0–285)

## 2017-08-10 ENCOUNTER — Other Ambulatory Visit: Payer: Self-pay | Admitting: Family Medicine

## 2017-08-10 MED ORDER — METFORMIN HCL 1000 MG PO TABS: 1000.0000 mg | ORAL_TABLET | Freq: Two times a day (BID) | ORAL | 0 refills | Status: DC

## 2017-08-10 MED ORDER — OXYCODONE HCL 20 MG PO TABS: ORAL_TABLET | ORAL | 0 refills | Status: DC

## 2017-08-10 NOTE — Progress Notes (Signed)
Increasing metformin

## 2017-08-10 NOTE — Progress Notes (Signed)
UDS negative. PMP consistent. Medication sent to the pharm.

## 2017-08-11 ENCOUNTER — Telehealth: Payer: Self-pay

## 2017-08-11 NOTE — Telephone Encounter (Signed)
Called, no answer. Left a message for patient to call back. Thanks!  

## 2017-08-11 NOTE — Telephone Encounter (Signed)
-----   Message from Lanae Boast, [place] sent at 08/10/2017  5:43 PM EDT ----- Please let patient know that I am going to increase his metformin to 1000mg  BID because his diabetes is not well controlled. . I also sent his oxycodone to the pharmacy.

## 2017-08-12 ENCOUNTER — Ambulatory Visit (INDEPENDENT_AMBULATORY_CARE_PROVIDER_SITE_OTHER): Payer: Medicaid Other

## 2017-08-12 ENCOUNTER — Ambulatory Visit (INDEPENDENT_AMBULATORY_CARE_PROVIDER_SITE_OTHER): Payer: Medicaid Other | Admitting: Orthopedic Surgery

## 2017-08-12 ENCOUNTER — Encounter (INDEPENDENT_AMBULATORY_CARE_PROVIDER_SITE_OTHER): Payer: Self-pay | Admitting: Orthopedic Surgery

## 2017-08-12 DIAGNOSIS — M25511 Pain in right shoulder: Principal | ICD-10-CM

## 2017-08-12 DIAGNOSIS — M19011 Primary osteoarthritis, right shoulder: Secondary | ICD-10-CM | POA: Diagnosis not present

## 2017-08-12 DIAGNOSIS — M87012 Idiopathic aseptic necrosis of left shoulder: Secondary | ICD-10-CM

## 2017-08-12 DIAGNOSIS — G8929 Other chronic pain: Secondary | ICD-10-CM | POA: Diagnosis not present

## 2017-08-12 DIAGNOSIS — M87011 Idiopathic aseptic necrosis of right shoulder: Secondary | ICD-10-CM

## 2017-08-12 DIAGNOSIS — M25512 Pain in left shoulder: Secondary | ICD-10-CM | POA: Diagnosis not present

## 2017-08-12 DIAGNOSIS — M19012 Primary osteoarthritis, left shoulder: Secondary | ICD-10-CM

## 2017-08-12 DIAGNOSIS — D571 Sickle-cell disease without crisis: Secondary | ICD-10-CM

## 2017-08-16 ENCOUNTER — Encounter (INDEPENDENT_AMBULATORY_CARE_PROVIDER_SITE_OTHER): Payer: Self-pay | Admitting: Orthopedic Surgery

## 2017-08-16 NOTE — Progress Notes (Signed)
Office Visit Note   Patient: Gregory Bryant           Date of Birth: 1975/04/01           MRN: [id number] Visit Date: 08/12/2017 Requested by: Lanae Boast, [address], [place] [address] PCP: Lanae Boast, FNP  Subjective: Chief Complaint  Patient presents with  . Shoulder Pain    BILAT    HPI: Gregory Bryant is a patient with mild bilateral shoulder pain.  He is previously been [facility] at [facility].  Right is about as worse as the left.  He has a history of sickle cell disease.  He describes decreased range of motion.  It does hurt for him to lift and raise his arm.  Currently he is not working.  Prior work included Acupuncturist.  He has not had injections into the shoulder joint region.  He is in the [place] sickle cell clinic.              ROS: All systems reviewed are negative as they relate to the chief complaint within the history of present illness.  Patient denies  fevers or chills.   Assessment & Plan: Visit Diagnoses:  1. Chronic pain of both shoulders   2. Bilateral shoulder region arthritis     Plan: Impression is bilateral shoulder avascular necrosis without loss of sphericity of either humeral head.  Symptoms currently are mild.  He will likely need humeral head replacement at some time in the future but currently he has maintained good range of motion without evidence of mechanical craterization of the humeral head.  I would wait on [place] until he becomes more symptomatic before proceeding with any type of surgical intervention.  Follow-up as needed.  Follow-Up Instructions: Return if symptoms worsen or fail to improve.   Orders:  Orders Placed This Encounter  Procedures  . XR Shoulder Right  . XR Shoulder Left   No orders of the defined types were placed in this encounter.     Procedures: No procedures performed   Clinical Data: No additional findings.  Objective: Vital Signs: There were no vitals  taken for this visit.  Physical Exam:   Constitutional: Patient appears well-developed HEENT:  Head: Normocephalic Eyes:EOM are normal Neck: Normal range of motion Cardiovascular: Normal rate Pulmonary/chest: Effort normal Neurologic: Patient is alert Skin: Skin is warm Psychiatric: Patient has normal mood and affect    Ortho Exam: Ortho exam demonstrates full active and passive range of motion of both shoulders without any coarse grinding or crepitus present.  Patient has good rotator cuff strength isolated infraspinatus supraspinatus and subscap muscle testing.  Motor or sensory function to the hands intact.  No other masses lymphadenopathy or skin changes noted in the shoulder girdle region. Specialty Comments:  No specialty comments available.  Imaging: No results found.   PMFS History: Patient Active Problem List   Diagnosis Date Noted  . Sickle cell anemia with pain ([place]) 07/22/2017  . Chronic musculoskeletal pain 03/30/2017  . Avascular necrosis ([place]) 03/30/2017  . Asthma, mild intermittent 03/30/2017  . Diabetes ([place]) 02/07/2017  . 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 Diabetes mellitus without complication ([place])   . Sickle cell anemia (HCC)     Family History  Problem Relation Age of Onset  . Diabetes Mother     Past Surgical History:  Procedure Laterality Date  . CHOLECYSTECTOMY     Social  History   Occupational History  . Not on file  Tobacco Use  . Smoking status: Never Smoker  . Smokeless tobacco: Never Used  Substance and Sexual Activity  . Alcohol use: No  . Drug use: No  . Sexual activity: Not on file

## 2017-08-31 ENCOUNTER — Ambulatory Visit (INDEPENDENT_AMBULATORY_CARE_PROVIDER_SITE_OTHER): Payer: Self-pay | Admitting: Family Medicine

## 2017-08-31 ENCOUNTER — Encounter: Payer: Self-pay | Admitting: Family Medicine

## 2017-08-31 VITALS — BP 136/87 | HR 77 | Temp 98.7°F | Resp 16 | Ht 66.0 in | Wt 167.0 lb

## 2017-08-31 DIAGNOSIS — D5700 Hb-SS disease with crisis, unspecified: Secondary | ICD-10-CM

## 2017-08-31 DIAGNOSIS — Z114 Encounter for screening for human immunodeficiency virus [HIV]: Secondary | ICD-10-CM

## 2017-08-31 DIAGNOSIS — E119 Type 2 diabetes mellitus without complications: Secondary | ICD-10-CM

## 2017-08-31 NOTE — Patient Instructions (Addendum)
Sports Drinks What are sports drinks? Sports drinks are flavored beverages that contain nutrients, such as:  Carbohydrates.  Minerals.  Vitamins.  Electrolytes. These are minerals that are found in your body in the form of dissolved salts. Electrolytes in sports drinks may include: ? Sodium. ? Potassium. ? Chloride.  These drinks are advertised to improve athletic performance by reducing fatigue and preventing dehydration. How do I use sports drinks? You should only consume sports drinks if you are participating in vigorous physical activity that lasts for one hour or longer. You may consume sports drinks before, during, and after extended vigorous activity or an athletic event. Sports drinks may also help to hydrate you during activities that are performed in high heat or humidity. Sports drinks are not recommended for children and adolescents because they contain high amounts of added sugar and sodium. For most children, adolescents, and adults, water provides the necessary hydration for physical activity. What are the risks of using sports drinks?  Changes in the body's natural balance of electrolytes.  Sudden increase in blood sugar.  Dehydration. Consuming a sports drink that is high in sugar when you are already dehydrated may make your condition worse.  Obesity. Children and adolescents who regularly consume sugary beverages, including sports drinks, may have an increased risk for obesity.  Dental erosion. The hard outer surface of your teeth may become worn down, and this increases your risk for cavities. What are some tips for using sports drinks?  Consume sports drinks in addition to water. Do this before, during, and after extended vigorous activity.  Drink enough water to keep your urine clear or pale yellow.  Try a sports drink during training to see whether you have any side effects from it. This may help you to avoid side effects during your athletic event.  Do  not consume sports drinks when you are not exercising. This information is not intended to replace advice given to you by your health care provider. Make sure you discuss any questions you have with your health care provider. Document Released: 01/13/2005 Document Revised: 12/11/2015 Document Reviewed: 07/03/2014 Elsevier Interactive Patient Education  2017 [employer] Sickle Cell Anemia, Adult Sickle cell anemia is a condition where your red blood cells are shaped like sickles. Red blood cells carry oxygen through the body. Sickle-shaped red blood cells do not live as long as normal red blood cells. They also clump together and block blood from flowing through the blood vessels. These things prevent the body from getting enough oxygen. Sickle cell anemia causes organ damage and pain. It also increases the risk of infection. Follow these instructions at home:  Drink enough fluid to keep your pee (urine) clear or pale yellow. Drink more in hot weather and during exercise.  Do not smoke. Smoking lowers oxygen levels in the blood.  Only take over-the-counter or prescription medicines as told by your doctor.  Take antibiotic medicines as told by your doctor. Make sure you finish them even if you start to feel better.  Take supplements as told by your doctor.  Consider wearing a medical alert bracelet. This tells anyone caring for you in an emergency of your condition.  When traveling, keep your medical information, doctors' names, and the medicines you take with you at all times.  If you have a fever, do not take fever medicines right away. This could cover up a problem. Tell your doctor.  Keep all follow-up visits with your doctor. Sickle cell anemia requires regular medical care.  Contact a doctor if: You have a fever. Get help right away if:  You feel dizzy or faint.  You have new belly (abdominal) pain, especially on the left side near the stomach area.  You have a lasting, often  uncomfortable and painful erection of the penis (priapism). If it is not treated right away, you will become unable to have sex (impotence).  You have numbness in your arms or legs or you have a hard time moving them.  You have a hard time talking.  You have a fever or lasting symptoms for more than 2-3 days.  You have a fever and your symptoms suddenly get worse.  You have signs or symptoms of infection. These include: ? Chills. ? Being more tired than normal (lethargy). ? Irritability. ? Poor eating. ? Throwing up (vomiting).  You have pain that is not helped with medicine.  You have shortness of breath.  You have pain in your chest.  You are coughing up pus-like or bloody mucus.  You have a stiff neck.  Your feet or hands swell or have pain.  Your belly looks bloated.  Your joints hurt. This information is not intended to replace advice given to you by your health care provider. Make sure you discuss any questions you have with your health care provider. Document Released: 11/03/2012 Document Revised: 06/21/2015 Document Reviewed: 08/25/2012 Elsevier Interactive Patient Education  2017 [employer]. Diabetes and Foot Care Diabetes may cause you to have problems because of poor blood supply (circulation) to your feet and legs. This may cause the skin on your feet to become thinner, break easier, and heal more slowly. Your skin may become dry, and the skin may peel and crack. You may also have nerve damage in your legs and feet causing decreased feeling in them. You may not notice minor injuries to your feet that could lead to infections or more serious problems. Taking care of your feet is one of the most important things you can do for yourself. Follow these instructions at home:  Wear shoes at all times, even in the house. Do not go barefoot. Bare feet are easily injured.  Check your feet daily for blisters, cuts, and redness. If you cannot see the bottom of your feet,  use a mirror or ask someone for help.  Wash your feet with warm water (do not use hot water) and mild soap. Then pat your feet and the areas between your toes until they are completely dry. Do not soak your feet as this can dry your skin.  Apply a moisturizing lotion or petroleum jelly (that does not contain alcohol and is unscented) to the skin on your feet and to dry, brittle toenails. Do not apply lotion between your toes.  Trim your toenails straight across. Do not dig under them or around the cuticle. File the edges of your nails with an emery board or nail file.  Do not cut corns or calluses or try to remove them with medicine.  Wear clean socks or stockings every day. Make sure they are not too tight. Do not wear knee-high stockings since they may decrease blood flow to your legs.  Wear shoes that fit properly and have enough cushioning. To break in new shoes, wear them for just a few hours a day. This prevents you from injuring your feet. Always look in your shoes before you put them on to be sure there are no objects inside.  Do not cross your legs. This may  decrease the blood flow to your feet.  If you find a minor scrape, cut, or break in the skin on your feet, keep it and the skin around it clean and dry. These areas may be cleansed with mild soap and water. Do not cleanse the area with peroxide, alcohol, or iodine.  When you remove an adhesive bandage, be sure not to damage the skin around it.  If you have a wound, look at it several times a day to make sure it is healing.  Do not use heating pads or hot water bottles. They may burn your skin. If you have lost feeling in your feet or legs, you may not know it is happening until it is too late.  Make sure your health care provider performs a complete foot exam at least annually or more often if you have foot problems. Report any cuts, sores, or bruises to your health care provider immediately. Contact a health care provider  if:  You have an injury that is not healing.  You have cuts or breaks in the skin.  You have an ingrown nail.  You notice redness on your legs or feet.  You feel burning or tingling in your legs or feet.  You have pain or cramps in your legs and feet.  Your legs or feet are numb.  Your feet always feel cold. Get help right away if:  There is increasing redness, swelling, or pain in or around a wound.  There is a red line that goes up your leg.  Pus is coming from a wound.  You develop a fever or as directed by your health care provider.  You notice a bad smell coming from an ulcer or wound. This information is not intended to replace advice given to you by your health care provider. Make sure you discuss any questions you have with your health care provider. Document Released: 01/11/2000 Document Revised: 06/21/2015 Document Reviewed: 06/22/2012 Elsevier Interactive Patient Education  2017 [employer].

## 2017-08-31 NOTE — Progress Notes (Signed)
Subjective   Gregory Bryant 42 y.o. male  [id number]  [id number]  1975-07-08 42 y.o.     Chief Complaint  Patient presents with  . Sickle Cell Anemia    Patient presents for SCD follow up. Patient seen by ortho on 08/12/2017 for evaluation of shoulder pain. Instructed to continue with medications. No surgery needed at the present time.  Patient states that his blood sugars run in the 120s. States that this morning, it was 200. Patient states that he modifies his eating, but he drinks sodas and gatorade.      Past Medical History:  Diagnosis Date  . Diabetes mellitus without complication ([place])   . Sickle cell anemia (HCC)     Social History   Socioeconomic History  . Marital status: Divorced    Spouse name: Not on file  . Number of children: Not on file  . Years of education: Not on file  . Highest education level: Not on file  Occupational History  . Not on file  Social Needs  . Financial resource strain: Not on file  . Food insecurity:    Worry: Not on file    Inability: Not on file  . Transportation needs:    Medical: Not on file    Non-medical: Not on file  Tobacco Use  . Smoking status: Never Smoker  . Smokeless tobacco: Never Used  Substance and Sexual Activity  . Alcohol use: No  . Drug use: No  . Sexual activity: Not on file  Lifestyle  . Physical activity:    Days per week: Not on file    Minutes per session: Not on file  . Stress: Not on file  Relationships  . Social connections:    Talks on phone: Not on file    Gets together: Not on file    Attends religious service: Not on file    Active member of club or organization: Not on file    Attends meetings of clubs or organizations: Not on file    Relationship status: Not on file  . Intimate partner violence:    Fear of current or ex partner: Not on file    Emotionally abused: Not on file    Physically abused: Not on file    Forced sexual activity: Not on file  Other Topics Concern   . Not on file  Social History Narrative  . Not on file      Review of Systems  Constitutional: Negative.   HENT: Negative.   Eyes: Negative.   Respiratory: Negative.   Cardiovascular: Negative.   Gastrointestinal: Negative.   Genitourinary: Negative.   Musculoskeletal: Negative.   Skin: Negative.   Neurological: Negative.   Psychiatric/Behavioral: Negative.     Objective   Physical Exam  Constitutional: He is oriented to person, place, and time. He appears well-developed and well-nourished. No distress.  HENT:  Head: Normocephalic and atraumatic.  Right Ear: External ear normal.  Left Ear: External ear normal.  Nose: Nose normal.  Mouth/Throat: Oropharynx is clear and moist.  Eyes: Pupils are equal, round, and reactive to light. Conjunctivae and EOM are normal.  Neck: Normal range of motion. Neck supple. No JVD present. No tracheal deviation present. No thyromegaly present.  Cardiovascular: Normal rate, regular rhythm, normal heart sounds and intact distal pulses. Exam reveals no gallop and no friction rub.  No murmur heard. Pulmonary/Chest: Effort normal and breath sounds normal. No stridor. No respiratory distress. He has no wheezes. He has no rales.  He exhibits no tenderness.  Abdominal: Soft. Bowel sounds are normal. He exhibits no distension and no mass. There is no tenderness.  Musculoskeletal: Normal range of motion. He exhibits no edema or tenderness.       Right foot: There is normal range of motion and no deformity.       Left foot: There is normal range of motion and no deformity.  Feet:  Right Foot:  Protective Sensation: 10 sites tested. 10 sites sensed.  Skin Integrity: Negative for ulcer, blister, skin breakdown, erythema or callus.  Left Foot:  Protective Sensation: 10 sites tested. 10 sites sensed.  Skin Integrity: Negative for ulcer, blister, skin breakdown, erythema or callus.  Lymphadenopathy:    He has no cervical adenopathy.  Neurological: He is  alert and oriented to person, place, and time.  Skin: Skin is warm and dry.  Psychiatric: He has a normal mood and affect. His behavior is normal. Judgment and thought content normal.  Nursing note and vitals reviewed.   BP 136/87 (BP Location: Right Arm, Patient Position: Sitting, Cuff Size: Normal)   Pulse 77   Temp 98.7 F (37.1 C) (Oral)   Resp 16   Ht 5\' 6"  (1.676 m)   Wt 167 lb (75.8 kg)   SpO2 97%   BMI 26.95 kg/m   Assessment   Encounter Diagnoses  Name Primary?  . Type 2 diabetes mellitus without complication, without long-term current use of insulin ([place]) Yes  . Sickle cell anemia with pain (HCC)   . Screening for HIV (human immunodeficiency virus)      Plan  1. Type 2 diabetes mellitus without complication, without long-term current use of insulin ([facility]CC) The current medical regimen is effective;  continue present plan and medications.  - Fructosamine - Microalbumin/Creatinine Ratio, Urine  2. Sickle cell anemia with pain ([facility]CC) The current medical regimen is effective;  continue present plan and medications.  - CBC With Differential - VITAMIN D 25 Hydroxy (Vit-D Deficiency, Fractures) - Comprehensive metabolic panel  3. Screening for HIV (human immunodeficiency virus)   - HIV antibody (with reflex)   Return to care as scheduled and prn. Patient verbalized understanding and agreed with plan of care.   1. Sickle cell disease - We discussed the need for good hydration, monitoring of hydration status, avoidance of heat, cold, stress, and infection triggers. We discussed the risks and benefits of Hydrea, including bone marrow suppression, the possibility of GI upset, skin ulcers, hair thinning, and teratogenicity. The patient was reminded of the need to seek medical attention of any symptoms of bleeding, anemia, or infection. Continue folic acid 1 mg daily to prevent aplastic bone marrow crises.   2. Pulmonary evaluation - Patient denies severe recurrent wheezes,  shortness of breath with exercise, or persistent cough. If these symptoms develop, pulmonary function tests with spirometry will be ordered, and if abnormal, plan on referral to Pulmonology for further evaluation.  3. Cardiac - Routine screening for pulmonary hypertension is not recommended.  4. Eye - High risk of proliferative retinopathy. Annual eye exam with retinal exam recommended to patient.  5. Immunization status -Yearly influenza vaccination is recommended, as well as being up to date with Meningococcal and Pneumococcal vaccines.   6. Acute and chronic painful episodes - We agreed on (oxycodone 20 mg PO q4h prn) . We discussed that pt is to receive Schedule II prescriptions only from [place]. Pt is also aware that the prescription history is available to [place] online through the [facility]  CSRS. Controlled substance agreement signed. We reminded (Pt) that all patients receiving Schedule II narcotics must be seen for follow within one month of prescription being requested. We reviewed the terms of our pain agreement, including the need to keep medicines in a safe locked location away from children or pets, and the need to report excess sedation or constipation, measures to avoid constipation, and policies related to early refills and stolen prescriptions. According to the [place] [facility] program, we have reviewed details related to analgesia, adverse effects, aberrant behaviors.  7. Iron overload from chronic transfusion.  Labs pending.   8. Vitamin D deficiency - Drisdol 50,000 units weekly. Patient encouraged to take as prescribed.  The above recommendations are taken from the NIH Evidence-Based Management of Sickle Cell Disease: Expert Panel Report, 20149.   Ms. Andr L. Nathaneil Canary, FNP-BC Patient [facility] [address] [place], [place] [address] [phone]  This note has been created with Dragon speech recognition software and smart phrase technology. Any  transcriptional errors are unintentional.

## 2017-09-01 LAB — COMPREHENSIVE METABOLIC PANEL
ALT: 24 IU/L (ref 0–44)
AST: 31 IU/L (ref 0–40)
Albumin/Globulin Ratio: 1.6 (ref 1.2–2.2)
Albumin: 4.6 g/dL (ref 3.5–5.5)
Alkaline Phosphatase: 126 IU/L — ABNORMAL HIGH (ref 39–117)
BUN/Creatinine Ratio: 11 (ref 9–20)
BUN: 9 mg/dL (ref 6–24)
Bilirubin Total: 2.7 mg/dL — ABNORMAL HIGH (ref 0.0–1.2)
CO2: 20 mmol/L (ref 20–29)
Calcium: 9.7 mg/dL (ref 8.7–10.2)
Chloride: 106 mmol/L (ref 96–106)
Creatinine, Ser: 0.85 mg/dL (ref 0.76–1.27)
GFR calc Af Amer: 124 mL/min/{1.73_m2} (ref >59)
GFR calc non Af Amer: 107 mL/min/{1.73_m2} (ref >59)
Globulin, Total: 2.9 g/dL (ref 1.5–4.5)
Glucose: 158 mg/dL — ABNORMAL HIGH (ref 65–99)
Potassium: 4 mmol/L (ref 3.5–5.2)
Sodium: 142 mmol/L (ref 134–144)
Total Protein: 7.5 g/dL (ref 6.0–8.5)

## 2017-09-01 LAB — CBC WITH DIFFERENTIAL
Basophils Absolute: 0.1 10*3/uL (ref 0.0–0.2)
Basos: 1 %
EOS (ABSOLUTE): 1 10*3/uL — ABNORMAL HIGH (ref 0.0–0.4)
Eos: 6 %
Hematocrit: 36.8 % — ABNORMAL LOW (ref 37.5–51.0)
Hemoglobin: 12.9 g/dL — ABNORMAL LOW (ref 13.0–17.7)
Immature Grans (Abs): 0.2 10*3/uL — ABNORMAL HIGH (ref 0.0–0.1)
Immature Granulocytes: 1 %
Lymphocytes Absolute: 4.8 10*3/uL — ABNORMAL HIGH (ref 0.7–3.1)
Lymphs: 29 %
MCH: 29.4 pg (ref 26.6–33.0)
MCHC: 35.1 g/dL (ref 31.5–35.7)
MCV: 84 fL (ref 79–97)
Monocytes Absolute: 0.9 10*3/uL (ref 0.1–0.9)
Monocytes: 5 %
NRBC: 1 % — ABNORMAL HIGH (ref 0–0)
Neutrophils Absolute: 9.8 10*3/uL — ABNORMAL HIGH (ref 1.4–7.0)
Neutrophils: 58 %
RBC: 4.39 x10E6/uL (ref 4.14–5.80)
RDW: 16.8 % — ABNORMAL HIGH (ref 12.3–15.4)
WBC: 16.7 10*3/uL — ABNORMAL HIGH (ref 3.4–10.8)

## 2017-09-01 LAB — MICROALBUMIN / CREATININE URINE RATIO
Creatinine, Urine: 62.5 mg/dL
Microalb/Creat Ratio: 6.2 mg/g creat (ref 0.0–30.0)
Microalbumin, Urine: 3.9 ug/mL

## 2017-09-01 LAB — FRUCTOSAMINE: Fructosamine: 359 umol/L — ABNORMAL HIGH (ref 0–285)

## 2017-09-01 LAB — HIV ANTIBODY (ROUTINE TESTING W REFLEX): HIV Screen 4th Generation wRfx: NONREACTIVE

## 2017-09-01 LAB — VITAMIN D 25 HYDROXY (VIT D DEFICIENCY, FRACTURES): Vit D, 25-Hydroxy: 27.7 ng/mL — ABNORMAL LOW (ref 30.0–100.0)

## 2017-10-05 ENCOUNTER — Ambulatory Visit: Payer: Medicaid Other | Admitting: Family Medicine

## 2017-10-12 ENCOUNTER — Ambulatory Visit (INDEPENDENT_AMBULATORY_CARE_PROVIDER_SITE_OTHER): Payer: Self-pay | Admitting: Family Medicine

## 2017-10-12 ENCOUNTER — Encounter: Payer: Self-pay | Admitting: Family Medicine

## 2017-10-12 VITALS — BP 136/86 | HR 61 | Temp 98.0°F | Resp 16 | Ht 66.0 in | Wt 170.0 lb

## 2017-10-12 DIAGNOSIS — E119 Type 2 diabetes mellitus without complications: Secondary | ICD-10-CM

## 2017-10-12 DIAGNOSIS — D5700 Hb-SS disease with crisis, unspecified: Secondary | ICD-10-CM

## 2017-10-12 DIAGNOSIS — Z23 Encounter for immunization: Secondary | ICD-10-CM

## 2017-10-12 LAB — POCT URINALYSIS DIPSTICK
Bilirubin, UA: NEGATIVE
Blood, UA: NEGATIVE
Glucose, UA: NEGATIVE
Ketones, UA: NEGATIVE
Leukocytes, UA: NEGATIVE
Nitrite, UA: NEGATIVE
Protein, UA: NEGATIVE
Spec Grav, UA: 1.01 (ref 1.010–1.025)
Urobilinogen, UA: 0.2 E.U./dL
pH, UA: 5.5 (ref 5.0–8.0)

## 2017-10-12 MED ORDER — FOLIC ACID 1 MG PO TABS: 1.0000 mg | ORAL_TABLET | Freq: Every day | ORAL | 11 refills | Status: DC

## 2017-10-12 MED ORDER — IBUPROFEN 600 MG PO TABS: 600.0000 mg | ORAL_TABLET | Freq: Three times a day (TID) | ORAL | 2 refills | Status: DC | PRN

## 2017-10-12 MED ORDER — METFORMIN HCL 1000 MG PO TABS: 1000.0000 mg | ORAL_TABLET | Freq: Two times a day (BID) | ORAL | 0 refills | Status: DC

## 2017-10-12 MED ORDER — GLIPIZIDE 10 MG PO TABS: 10.0000 mg | ORAL_TABLET | Freq: Every day | ORAL | 5 refills | Status: DC

## 2017-10-12 MED ORDER — OXYCODONE HCL 20 MG PO TABS: ORAL_TABLET | ORAL | 0 refills | Status: DC

## 2017-10-12 NOTE — Patient Instructions (Signed)
Sickle Cell Anemia, Adult °Sickle cell anemia is a condition where your red blood cells are shaped like sickles. Red blood cells carry oxygen through the body. Sickle-shaped red blood cells do not live as long as normal red blood cells. They also clump together and block blood from flowing through the blood vessels. These things prevent the body from getting enough oxygen. Sickle cell anemia causes organ damage and pain. It also increases the risk of infection. °Follow these instructions at home: °· Drink enough fluid to keep your pee (urine) clear or pale yellow. Drink more in hot weather and during exercise. °· Do not smoke. Smoking lowers oxygen levels in the blood. °· Only take over-the-counter or prescription medicines as told by your doctor. °· Take antibiotic medicines as told by your doctor. Make sure you finish them even if you start to feel better. °· Take supplements as told by your doctor. °· Consider wearing a medical alert bracelet. This tells anyone caring for you in an emergency of your condition. °· When traveling, keep your medical information, doctors' names, and the medicines you take with you at all times. °· If you have a fever, do not take fever medicines right away. This could cover up a problem. Tell your doctor. °· Keep all follow-up visits with your doctor. Sickle cell anemia requires regular medical care. °Contact a doctor if: °You have a fever. °Get help right away if: °· You feel dizzy or faint. °· You have new belly (abdominal) pain, especially on the left side near the stomach area. °· You have a lasting, often uncomfortable and painful erection of the penis (priapism). If it is not treated right away, you will become unable to have sex (impotence). °· You have numbness in your arms or legs or you have a hard time moving them. °· You have a hard time talking. °· You have a fever or lasting symptoms for more than 2-3 days. °· You have a fever and your symptoms suddenly get  worse. °· You have signs or symptoms of infection. These include: °? Chills. °? Being more tired than normal (lethargy). °? Irritability. °? Poor eating. °? Throwing up (vomiting). °· You have pain that is not helped with medicine. °· You have shortness of breath. °· You have pain in your chest. °· You are coughing up pus-like or bloody mucus. °· You have a stiff neck. °· Your feet or hands swell or have pain. °· Your belly looks bloated. °· Your joints hurt. °This information is not intended to replace advice given to you by your health care provider. Make sure you discuss any questions you have with your health care provider. °Document Released: 11/03/2012 Document Revised: 06/21/2015 Document Reviewed: 08/25/2012 °Elsevier Interactive Patient Education © 2017 [employer] Inc. ° °

## 2017-10-12 NOTE — Progress Notes (Signed)
PATIENT CARE CENTER INTERNAL MEDICINE AND SICKLE CELL CARE  SICKLE CELL ANEMIA FOLLOW UP VISIT PROVIDER: Lanae Boast, FNP    Subjective:   Gregory Bryant  is a 42 y.o.  male who  has a past medical history of Diabetes mellitus without complication ([place]) and Sickle cell anemia ([place]). presents for a follow up for Sickle Cell Anemia. his last hospitalization was 07/21/2017 he has had less than 6 hospitalizations in the past 12 months.  Pain regimen includes: Ibuprofen and oxy 20 mg prn Hydrea Therapy: No Medication compliance: Yes  Pain today is 0/10 Patient reports drinking 2 gallons fluid per day.   Last eye exam: 2 months ago per patient Patient states that he has an appointment today with hematologist at [facility].   Review of Systems  Constitutional: Negative.   HENT: Negative.   Eyes: Negative.   Respiratory: Negative.   Cardiovascular: Negative.   Gastrointestinal: Negative.   Genitourinary: Negative.   Musculoskeletal: Negative.   Skin: Negative.   Neurological: Negative.   Psychiatric/Behavioral: Negative.     Objective:   Objective  BP 136/86 (BP Location: Right Arm, Patient Position: Sitting, Cuff Size: Normal)   Pulse 61   Temp 98 F (36.7 C) (Oral)   Resp 16   Ht 5\' 6"  (1.676 m)   Wt 170 lb (77.1 kg)   SpO2 97%   BMI 27.44 kg/m    Physical Exam  Constitutional: He is oriented to person, place, and time. He appears well-developed and well-nourished. No distress.  HENT:  Head: Normocephalic and atraumatic.  Eyes: Pupils are equal, round, and reactive to light. Conjunctivae and EOM are normal.  Neck: Normal range of motion.  Cardiovascular: Normal rate, regular rhythm, normal heart sounds and intact distal pulses.  Pulmonary/Chest: Effort normal and breath sounds normal. No respiratory distress.  Abdominal: Soft. Bowel sounds are normal. He exhibits no distension.  Musculoskeletal: Normal range of motion.  Neurological: He is alert and  oriented to person, place, and time.  Skin: Skin is warm and dry.  Psychiatric: He has a normal mood and affect. His behavior is normal. Thought content normal.  Nursing note and vitals reviewed.    Assessment/Plan:   Assessment   Encounter Diagnoses  Name Primary?  . Flu vaccine need Yes  . Need for diphtheria-tetanus-pertussis (Tdap) vaccine   . Type 2 diabetes mellitus without complication, without long-term current use of insulin ([place])   . Sickle cell anemia with pain (HCC)      Plan  1. Flu vaccine need Influenza vaccination given in the office visit today. - Flu Vaccine QUAD 6+ mos PF IM (Fluarix Quad PF)  2. Need for diphtheria-tetanus-pertussis (Tdap) vaccine Given in the office today.  - Tdap vaccine greater than or equal to 7yo IM  3. Type 2 diabetes mellitus without complication, without long-term current use of insulin (HCC) The current medical regimen is effective;  continue present plan and medications.  - CBC with Differential - Comprehensive metabolic panel - Fructosamine - Urinalysis Dipstick - metFORMIN (GLUCOPHAGE) 1000 MG tablet; Take 1 tablet (1,000 mg total) by mouth 2 (two) times daily with a meal.  Dispense: 180 tablet; Refill: 0 - glipiZIDE (GLUCOTROL) 10 MG tablet; Take 1 tablet (10 mg total) by mouth daily before breakfast.  Dispense: 30 tablet; Refill: 5  4. Sickle cell anemia with pain (HCC) The current medical regimen is effective;  continue present plan and medications. - Vitamin D, Ergocalciferol, (DRISDOL) 50000 units CAPS capsule; Take 50,000 Units  by mouth every 7 (seven) days. - CBC with Differential - Comprehensive metabolic panel - Oxycodone HCl 20 MG TABS; TAKE 1 TABLET BY MOUTH 4 TIMES A DAY AS NEEDED FOR PAIN  Dispense: 60 tablet; Refill: 0 - folic acid (FOLVITE) 1 MG tablet; Take 1 tablet (1 mg total) by mouth daily.  Dispense: 30 tablet; Refill: 11 - ibuprofen (ADVIL,MOTRIN) 600 MG tablet; Take 1 tablet (600 mg total) by mouth  every 8 (eight) hours as needed.  Dispense: 30 tablet; Refill: 2   Return to care as scheduled and prn. Patient verbalized understanding and agreed with plan of care.   1. Sickle cell disease -   We discussed the need for good hydration, monitoring of hydration status, avoidance of heat, cold, stress, and infection triggers. We discussed the risks and benefits of Hydrea, including bone marrow suppression, the possibility of GI upset, skin ulcers, hair thinning, and teratogenicity. The patient was reminded of the need to seek medical attention of any symptoms of bleeding, anemia, or infection. Continue folic acid 1 mg daily to prevent aplastic bone marrow crises.   2. Pulmonary evaluation - Patient denies severe recurrent wheezes, shortness of breath with exercise, or persistent cough. If these symptoms develop, pulmonary function tests with spirometry will be ordered, and if abnormal, plan on referral to Pulmonology for further evaluation.  3. Cardiac - Routine screening for pulmonary hypertension is not recommended.  4. Eye - High risk of proliferative retinopathy. Annual eye exam with retinal exam recommended to patient.  5. Immunization status -  Yearly influenza vaccination is recommended, as well as being up to date with Meningococcal and Pneumococcal vaccines.  Patient received TDAP and Flu shot today.   6. Acute and chronic painful episodes - We discussed that pt is to receive Schedule II prescriptions only from [place]. Pt is also aware that the prescription history is available to [place] online through the [facility] CSRS. Controlled substance agreement signed. We reminded Corney Knighton that all patients receiving Schedule II narcotics must be seen for follow within one month of prescription being requested. We reviewed the terms of our pain agreement, including the need to keep medicines in a safe locked location away from children or pets, and the need to report excess sedation or constipation,  measures to avoid constipation, and policies related to early refills and stolen prescriptions. According to the [place] Chronic Pain Initiative program, we have reviewed details related to analgesia, adverse effects, aberrant behaviors.  7. Iron overload from chronic transfusion.  Not applicable at this time.  If this occurs will use Exjade for management.   8. Vitamin D deficiency - Drisdol 50,000 units weekly. Patient encouraged to take as prescribed.   The above recommendations are taken from the NIH Evidence-Based Management of Sickle Cell Disease: Expert Panel Report, 20149.   Ms. Andr L. Nathaneil Canary, FNP-BC Patient [facility] [address] [place], [place] [address] [phone]  This note has been created with Dragon speech recognition software and smart phrase technology. Any transcriptional errors are unintentional.

## 2017-10-13 LAB — COMPREHENSIVE METABOLIC PANEL
ALT: 26 IU/L (ref 0–44)
AST: 32 IU/L (ref 0–40)
Albumin/Globulin Ratio: 1.6 (ref 1.2–2.2)
Albumin: 4.7 g/dL (ref 3.5–5.5)
Alkaline Phosphatase: 109 IU/L (ref 39–117)
BUN/Creatinine Ratio: 10 (ref 9–20)
BUN: 9 mg/dL (ref 6–24)
Bilirubin Total: 3 mg/dL — ABNORMAL HIGH (ref 0.0–1.2)
CO2: 18 mmol/L — ABNORMAL LOW (ref 20–29)
Calcium: 9.7 mg/dL (ref 8.7–10.2)
Chloride: 105 mmol/L (ref 96–106)
Creatinine, Ser: 0.87 mg/dL (ref 0.76–1.27)
GFR calc Af Amer: 123 mL/min/{1.73_m2} (ref >59)
GFR calc non Af Amer: 106 mL/min/{1.73_m2} (ref >59)
Globulin, Total: 2.9 g/dL (ref 1.5–4.5)
Glucose: 153 mg/dL — ABNORMAL HIGH (ref 65–99)
Potassium: 4.6 mmol/L (ref 3.5–5.2)
Sodium: 138 mmol/L (ref 134–144)
Total Protein: 7.6 g/dL (ref 6.0–8.5)

## 2017-10-13 LAB — 737588 9+OXYCODONE+CRT-UNBUND
Amphetamine Scrn, Ur: NEGATIVE ng/mL
BARBITURATE SCREEN URINE: NEGATIVE ng/mL
BENZODIAZEPINE SCREEN, URINE: NEGATIVE ng/mL
CANNABINOIDS UR QL SCN: NEGATIVE ng/mL
Cocaine (Metab) Scrn, Ur: NEGATIVE ng/mL
Creatinine(Crt), U: 46.7 mg/dL (ref 20.0–300.0)
Methadone Screen, Urine: NEGATIVE ng/mL
OXYCODONE+OXYMORPHONE UR QL SCN: NEGATIVE ng/mL
Opiate Scrn, Ur: NEGATIVE ng/mL
Ph of Urine: 5.8 (ref 4.5–8.9)
Phencyclidine Qn, Ur: NEGATIVE ng/mL
Propoxyphene Scrn, Ur: NEGATIVE ng/mL

## 2017-10-13 LAB — CBC WITH DIFFERENTIAL/PLATELET
Basophils Absolute: 0.1 10*3/uL (ref 0.0–0.2)
Basos: 1 %
EOS (ABSOLUTE): 1.4 10*3/uL — ABNORMAL HIGH (ref 0.0–0.4)
Eos: 10 %
Hematocrit: 39.1 % (ref 37.5–51.0)
Hemoglobin: 13.4 g/dL (ref 13.0–17.7)
Immature Grans (Abs): 0.1 10*3/uL (ref 0.0–0.1)
Immature Granulocytes: 1 %
Lymphocytes Absolute: 4.6 10*3/uL — ABNORMAL HIGH (ref 0.7–3.1)
Lymphs: 30 %
MCH: 29.5 pg (ref 26.6–33.0)
MCHC: 34.3 g/dL (ref 31.5–35.7)
MCV: 86 fL (ref 79–97)
Monocytes Absolute: 1 10*3/uL — ABNORMAL HIGH (ref 0.1–0.9)
Monocytes: 7 %
NRBC: 1 % — ABNORMAL HIGH (ref 0–0)
Neutrophils Absolute: 7.4 10*3/uL — ABNORMAL HIGH (ref 1.4–7.0)
Neutrophils: 51 %
Platelets: 404 10*3/uL (ref 150–450)
RBC: 4.55 x10E6/uL (ref 4.14–5.80)
RDW: 15.6 % — ABNORMAL HIGH (ref 12.3–15.4)
WBC: 14.6 10*3/uL — ABNORMAL HIGH (ref 3.4–10.8)

## 2017-10-13 LAB — FRUCTOSAMINE: Fructosamine: 346 umol/L — ABNORMAL HIGH (ref 0–285)

## 2017-10-13 LAB — MED LIST OPTION NOT SELECTED

## 2017-10-19 ENCOUNTER — Emergency Department (HOSPITAL_COMMUNITY)
Admission: EM | Admit: 2017-10-19 | Discharge: 2017-10-20 | Disposition: A | Discharge status: Home / Self Care | Payer: Medicaid Other | Attending: Emergency Medicine | Admitting: Emergency Medicine

## 2017-10-19 ENCOUNTER — Encounter (HOSPITAL_COMMUNITY): Payer: Self-pay | Admitting: Emergency Medicine

## 2017-10-19 DIAGNOSIS — J4520 Mild intermittent asthma, uncomplicated: Secondary | ICD-10-CM | POA: Insufficient documentation

## 2017-10-19 DIAGNOSIS — E119 Type 2 diabetes mellitus without complications: Secondary | ICD-10-CM | POA: Insufficient documentation

## 2017-10-19 DIAGNOSIS — D5700 Hb-SS disease with crisis, unspecified: Secondary | ICD-10-CM

## 2017-10-19 DIAGNOSIS — D72829 Elevated white blood cell count, unspecified: Secondary | ICD-10-CM | POA: Insufficient documentation

## 2017-10-19 DIAGNOSIS — Z7984 Long term (current) use of oral hypoglycemic drugs: Secondary | ICD-10-CM | POA: Insufficient documentation

## 2017-10-19 LAB — RETICULOCYTES
RBC.: 4.18 MIL/uL — ABNORMAL LOW (ref 4.22–5.81)
RETIC CT PCT: 11.1 % — AB (ref 0.4–3.1)
Retic Count, Absolute: 464 10*3/uL — ABNORMAL HIGH (ref 19.0–186.0)

## 2017-10-19 LAB — CBC WITH DIFFERENTIAL/PLATELET
Basophils Absolute: 0.3 10*3/uL — ABNORMAL HIGH (ref 0.0–0.1)
Basophils Relative: 1 %
EOS PCT: 2 %
Eosinophils Absolute: 0.5 10*3/uL (ref 0.0–0.7)
HCT: 34 % — ABNORMAL LOW (ref 39.0–52.0)
HEMOGLOBIN: 12.4 g/dL — AB (ref 13.0–17.0)
LYMPHS ABS: 4 10*3/uL (ref 0.7–4.0)
LYMPHS PCT: 15 %
MCH: 29.7 pg (ref 26.0–34.0)
MCHC: 36.5 g/dL — ABNORMAL HIGH (ref 30.0–36.0)
MCV: 81.3 fL (ref 78.0–100.0)
Monocytes Absolute: 1.9 10*3/uL (ref 0.1–1.0)
Monocytes Relative: 7 %
NRBC: 6 /100{WBCs} — AB
Neutro Abs: 20.5 10*3/uL (ref 1.7–7.7)
Neutrophils Relative %: 75 %
Platelets: 422 10*3/uL — ABNORMAL HIGH (ref 150–400)
RBC: 4.18 MIL/uL — AB (ref 4.22–5.81)
RDW: 17.1 % — ABNORMAL HIGH (ref 11.5–15.5)
WBC: 27.3 10*3/uL — AB (ref 4.0–10.5)

## 2017-10-19 LAB — URINALYSIS, ROUTINE W REFLEX MICROSCOPIC
BILIRUBIN URINE: NEGATIVE
Glucose, UA: NEGATIVE mg/dL
HGB URINE DIPSTICK: NEGATIVE
KETONES UR: NEGATIVE mg/dL
Leukocytes, UA: NEGATIVE
Nitrite: NEGATIVE
Protein, ur: NEGATIVE mg/dL
Specific Gravity, Urine: 1.01 (ref 1.005–1.030)
pH: 6 (ref 5.0–8.0)

## 2017-10-19 LAB — COMPREHENSIVE METABOLIC PANEL
ALBUMIN: 4.4 g/dL (ref 3.5–5.0)
ALT: 42 U/L (ref 0–44)
AST: 51 U/L — AB (ref 15–41)
Alkaline Phosphatase: 117 U/L (ref 38–126)
Anion gap: 12 (ref 5–15)
BUN: 9 mg/dL (ref 6–20)
CHLORIDE: 107 mmol/L (ref 98–111)
CO2: 22 mmol/L (ref 22–32)
Calcium: 10 mg/dL (ref 8.9–10.3)
Creatinine, Ser: 0.81 mg/dL (ref 0.61–1.24)
GFR calc Af Amer: >60 mL/min (ref >60)
Glucose, Bld: 123 mg/dL — ABNORMAL HIGH (ref 70–99)
POTASSIUM: 3.3 mmol/L — AB (ref 3.5–5.1)
SODIUM: 141 mmol/L (ref 135–145)
Total Bilirubin: 3.8 mg/dL — ABNORMAL HIGH (ref 0.3–1.2)
Total Protein: 8.8 g/dL — ABNORMAL HIGH (ref 6.5–8.1)

## 2017-10-19 LAB — LIPASE, BLOOD: LIPASE: 32 U/L (ref 11–51)

## 2017-10-19 MED ORDER — HYDROMORPHONE HCL 2 MG/ML IJ SOLN
2.0000 mg | INTRAMUSCULAR | Status: DC
  Filled 2017-10-19: qty 1

## 2017-10-19 MED ORDER — OXYCODONE HCL 5 MG PO TABS
20.0000 mg | ORAL_TABLET | Freq: Once | ORAL | Status: AC
  Administered 2017-10-20: 20 mg via ORAL
  Filled 2017-10-19: qty 4

## 2017-10-19 MED ORDER — HYDROMORPHONE HCL 2 MG/ML IJ SOLN: 2.0000 mg | INTRAMUSCULAR | Status: AC

## 2017-10-19 MED ORDER — HYDROMORPHONE HCL 2 MG/ML IJ SOLN
2.0000 mg | INTRAMUSCULAR | Status: AC
  Administered 2017-10-19: 2 mg via INTRAVENOUS
  Filled 2017-10-19: qty 1

## 2017-10-19 MED ORDER — HYDROMORPHONE HCL 2 MG/ML IJ SOLN
2.0000 mg | INTRAMUSCULAR | Status: AC
  Administered 2017-10-19: 2 mg via INTRAVENOUS

## 2017-10-19 MED ORDER — SODIUM CHLORIDE 0.45 % IV SOLN
INTRAVENOUS | Status: DC
  Administered 2017-10-19: 20:00:00 via INTRAVENOUS

## 2017-10-19 MED ORDER — HYDROMORPHONE HCL 2 MG/ML IJ SOLN: 2.0000 mg | INTRAMUSCULAR | Status: DC

## 2017-10-19 MED ORDER — POTASSIUM CHLORIDE CRYS ER 20 MEQ PO TBCR
30.0000 meq | EXTENDED_RELEASE_TABLET | Freq: Once | ORAL | Status: AC
  Administered 2017-10-19: 30 meq via ORAL
  Filled 2017-10-19: qty 1

## 2017-10-19 NOTE — ED Triage Notes (Signed)
Pt having sickle cell pain that got worse since last Monday when seen at PCP. Legs, arms and back. Also having diarrhea. Taken home meds without any relief.

## 2017-10-19 NOTE — ED Provider Notes (Signed)
[facility] DEPT Provider Note   CSN: [id number] Arrival date & time: 10/19/17  1831     History   Chief Complaint Chief Complaint  Patient presents with  . Sickle Cell Pain Crisis    HPI Gregory Bryant is a 42 y.o. male with past medical history of sickle cell anemia, DM, asthma, avascular necrosis, who presents today for evaluation of sickle cell pain.  He was seen by his PCP last Monday when his sickle cell pain started getting worse, and is in his usual locations which is his legs arms and back.  He has been taking his medicines at home without significant relief.  Today he is also been having multiple bowel movements, he denies them being diarrhea.  He has been unable to control his pain at home so he is here.  No fevers, N/V/D.   HPI  Past Medical History:  Diagnosis Date  . Diabetes mellitus without complication ([place])   . Sickle cell anemia [facility])     Patient Active Problem List   Diagnosis Date Noted  . Sickle cell anemia with pain ([place]) 07/22/2017  . Chronic musculoskeletal pain 03/30/2017  . Avascular necrosis ([place]) 03/30/2017  . Asthma, mild intermittent 03/30/2017  . Diabetes ([place]) 02/07/2017  . 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 CHOLECYSTECTOMY          Home Medications    Prior to Admission medications   Medication Sig Start Date End Date Taking? Authorizing Provider  cetirizine-pseudoephedrine (ZYRTEC-D) 5-120 MG per tablet Take 1 tablet by mouth 2 (two) times daily. Patient taking differently: Take 1 tablet by mouth 2 (two) times daily as needed for allergies.  04/27/13  Yes Dammen, Collier [place], PA-C  folic acid (FOLVITE) 1 MG tablet Take 1 tablet (1 mg total) by mouth daily. 10/12/17  Yes Lanae Boast, FNP  glipiZIDE (GLUCOTROL) 10 MG tablet Take 1 tablet (10 mg total) by mouth daily before breakfast. 10/12/17  Yes Lanae Boast, FNP  ibuprofen (ADVIL,MOTRIN) 600 MG  tablet Take 1 tablet (600 mg total) by mouth every 8 (eight) hours as needed. 10/12/17  Yes Lanae Boast, FNP  metFORMIN (GLUCOPHAGE) 1000 MG tablet Take 1 tablet (1,000 mg total) by mouth 2 (two) times daily with a meal. 10/12/17 01/10/18 Yes Lanae Boast, FNP  ondansetron (ZOFRAN ODT) 8 MG disintegrating tablet Take 1 tablet (8 mg total) by mouth every 8 (eight) hours as needed for nausea or vomiting. 03/07/15  Yes Street, [place], PA-C  Oxycodone HCl 20 MG TABS TAKE 1 TABLET BY MOUTH 4 TIMES A DAY AS NEEDED FOR PAIN Patient taking differently: Take 20 mg by mouth 4 (four) times daily as needed (pain).  10/12/17  Yes Lanae Boast, FNP  Vitamin D, Ergocalciferol, (DRISDOL) 50000 units CAPS capsule Take 50,000 Units by mouth every 7 (seven) days.   Yes [provider]    Family History Family History  Problem Relation Age of Onset  . Diabetes Mother     Social History Social History   Tobacco Use  . Smoking status: Never Smoker  . Smokeless tobacco: Never Used  Substance Use Topics  . Alcohol use: No  . Drug use: No     Allergies   Aspirin and Sulfa antibiotics   Review of Systems Review of Systems  Constitutional: Negative for chills and fever.  HENT: Negative for congestion.   Respiratory: Negative for chest tightness and shortness of breath.   Cardiovascular: Negative for  chest pain and palpitations.  Gastrointestinal: Negative for abdominal pain, nausea and vomiting.  Musculoskeletal: Positive for arthralgias, back pain and myalgias. Negative for neck pain.  All other systems reviewed and are negative.    Physical Exam Updated Vital Signs BP (!) 143/99   Pulse 76   Temp 99.4 F (37.4 C) (Oral)   Resp 18   SpO2 97%   Physical Exam  Constitutional: He appears well-developed and well-nourished. He appears distressed (Appears obviously uncomfortable).  HENT:  Head: Normocephalic and atraumatic.  Mouth/Throat: Oropharynx is clear and moist.  Eyes:  Conjunctivae are normal.  Neck: Neck supple.  Cardiovascular: Normal rate and regular rhythm.  No murmur heard. Pulmonary/Chest: Effort normal and breath sounds normal. No respiratory distress.  Abdominal: Soft. There is no tenderness.  Musculoskeletal: He exhibits no edema.  Neurological: He is alert.  Skin: Skin is warm and dry.  Psychiatric: He has a normal mood and affect.  Nursing note and vitals reviewed.    ED Treatments / Results  Labs (all labs ordered are listed, but only abnormal results are displayed) Labs Reviewed  COMPREHENSIVE METABOLIC PANEL - Abnormal; Notable for the following components:      Result Value   Potassium 3.3 (*)    Glucose, Bld 123 (*)    Total Protein 8.8 (*)    AST 51 (*)    Total Bilirubin 3.8 (*)    All other components within normal limits  CBC WITH DIFFERENTIAL/PLATELET - Abnormal; Notable for the following components:   WBC 27.3 (*)    RBC 4.18 (*)    Hemoglobin 12.4 (*)    HCT 34.0 (*)    MCHC 36.5 (*)    RDW 17.1 (*)    Platelets 422 (*)    Basophils Absolute 0.3 (*)    nRBC 6 (*)    All other components within normal limits  RETICULOCYTES - Abnormal; Notable for the following components:   Retic Ct Pct 11.1 (*)    RBC. 4.18 (*)    Retic Count, Absolute 464.0 (*)    All other components within normal limits  LIPASE, BLOOD  URINALYSIS, ROUTINE W REFLEX MICROSCOPIC    EKG None  Radiology No results found.  Procedures Procedures (including critical care time)  Medications Ordered in ED Medications  0.45 % sodium chloride infusion ( Intravenous New Bag/Given 10/19/17 2025)  oxyCODONE (Oxy IR/ROXICODONE) immediate release tablet 20 mg (has no administration in time range)  HYDROmorphone (DILAUDID) injection 2 mg (2 mg Intravenous Given 10/19/17 2058)    Or  HYDROmorphone (DILAUDID) injection 2 mg ( Subcutaneous See Alternative 10/19/17 2058)  HYDROmorphone (DILAUDID) injection 2 mg (2 mg Intravenous Given 10/19/17 2025)     Or  HYDROmorphone (DILAUDID) injection 2 mg ( Subcutaneous See Alternative 10/19/17 2025)  potassium chloride (K-DUR,KLOR-CON) CR tablet 30 mEq (30 mEq Oral Given 10/19/17 0000)     Initial Impression / Assessment and Plan / ED Course  I have reviewed the triage vital signs and the nursing notes.  Pertinent labs & imaging results that were available during my care of the patient were reviewed by me and considered in my medical decision making (see chart for details).  Clinical Course as of Oct 21 14  Tue Oct 20, 2017  0013 Patient reevaluated, discussed with him the significance of his leukocytosis.  Given that his initial symptoms was back pain will obtain a chest x-ray to make sure that this is not referred from a pneumonia.  We will give  him his dose from home of 20 mg OxyIR, and as long as that continues to control his pain will discharge him.   [EH]    Clinical Course User Index [EH] Lorin Glass, PA-C   Gregory Bryant presents today for evaluation of reported sickle cell pain crisis.  He has been trying his usual meds at home with out relief.  His sickle cell pain  was on his usual location, and crisis.  He has had warm bowel movements than usual, however they have been well formed per his report, no diarrhea.  He does not have any abdominal pain or chest pain.  His labs showed a significant leukocytosis of 27.3 with a moderate left shift.  Patient did report back pain, which was attributed to his usual sickle cell crisis, however given leukocytosis chest x-ray was ordered to evaluate for pneumonia causing referred back pain exacerbating crisis.  Aside from leukocytosis, which may be explained by patient's prolonged pain, labs consistent with a sickle cell crisis.  Potassium is mildly low at 3.3, p.o. replacement was ordered.  Patient was treated in the emergency room with 2 doses of Dilaudid, after which he had significiant pain relief.  Discussed with patient he has  leukocytosis and with this discussed specific return precautions.  Plan is if CXR normal than Discharge home.    Return precautions were discussed with patient who states their understanding. patient denied any unaddressed complaints or concerns.  Patient is agreeable for discharge home.    Final Clinical Impressions(s) / ED Diagnoses   Final diagnoses:  Sickle cell pain crisis ([place])  Leukocytosis, unspecified type    ED Discharge Orders    None       Ollen Gross 10/20/17 0152    Maudie Flakes, MD 10/22/17 [phone]

## 2017-10-20 ENCOUNTER — Emergency Department (HOSPITAL_COMMUNITY): Payer: Medicaid Other

## 2017-10-20 NOTE — ED Notes (Signed)
Patient transported to X-ray 

## 2017-10-20 NOTE — ED Provider Notes (Signed)
Patient seen by Wyn Quaker, PA-C, for treatment of sickle cell pain. Pain is controlled at this time. VSS. Pending CXR due to pain location in back and low grade fever - if negative can go home per plan in place.   CXR negative for infiltrates. He can be discharged home per plan of previous treatment team.    Charlann Lange, PA-C 10/20/17 0115    Maudie Flakes, MD 10/22/17 [phone]

## 2017-10-20 NOTE — Discharge Instructions (Addendum)
Today you received medications that may make you sleepy or impair your ability to make decisions.  For the next 24 hours please do not drive, operate heavy machinery, care for a small child with out another adult present, or perform any activities that may cause harm to you or someone else if you were to fall asleep or be impaired.   You are being prescribed a medication which may make you sleepy. Please follow up of listed precautions for at least 24 hours after taking one dose.  As we discussed today your white blood cell count was elevated.

## 2017-11-05 ENCOUNTER — Telehealth: Payer: Self-pay

## 2017-11-05 NOTE — Telephone Encounter (Signed)
Left a vm that patient has appointment on 10/14 at 1020am and to give [place] a call to reschedule if he is unable to make it.

## 2017-11-09 ENCOUNTER — Ambulatory Visit (INDEPENDENT_AMBULATORY_CARE_PROVIDER_SITE_OTHER): Payer: Self-pay | Admitting: Family Medicine

## 2017-11-09 ENCOUNTER — Encounter: Payer: Self-pay | Admitting: Family Medicine

## 2017-11-09 VITALS — BP 148/94 | HR 63 | Temp 98.3°F | Resp 16 | Ht 66.0 in | Wt 170.0 lb

## 2017-11-09 DIAGNOSIS — E119 Type 2 diabetes mellitus without complications: Secondary | ICD-10-CM

## 2017-11-09 DIAGNOSIS — D5700 Hb-SS disease with crisis, unspecified: Secondary | ICD-10-CM

## 2017-11-09 LAB — POCT URINALYSIS DIPSTICK
Bilirubin, UA: NEGATIVE
Blood, UA: NEGATIVE
Glucose, UA: NEGATIVE
Ketones, UA: NEGATIVE
Leukocytes, UA: NEGATIVE
Nitrite, UA: NEGATIVE
Protein, UA: NEGATIVE
Spec Grav, UA: 1.01 (ref 1.010–1.025)
Urobilinogen, UA: 1 E.U./dL
pH, UA: 6.5 (ref 5.0–8.0)

## 2017-11-09 NOTE — Progress Notes (Signed)
PATIENT CARE CENTER INTERNAL MEDICINE AND SICKLE CELL CARE  SICKLE CELL ANEMIA FOLLOW UP VISIT PROVIDER: Lanae Boast, FNP    Subjective:   Gregory Bryant  is a 42 y.o.  male who  has a past medical history of Diabetes mellitus without complication ([place]) and Sickle cell anemia ([place]). presents for a follow up for Sickle Cell Anemia. his last hospitalization was 10/19/2017. he has had less than 6hospitalizations in the past 12 months.  Pain regimen includes: Ibuprofen and oxycodone 20mg  Hydrea Therapy: No Medication compliance: Yes  Pain today is 0/10 today The patient reports adequate daily hydration.   Patient states that he was given 3 vaccinations on 10/12/2017 which he feels induced a sickle cell crisis.   Review of Systems  Constitutional: Negative.   HENT: Negative.   Eyes: Negative.   Respiratory: Negative.   Cardiovascular: Negative.   Gastrointestinal: Negative.   Genitourinary: Negative.   Musculoskeletal: Negative.   Skin: Negative.   Neurological: Negative.   Psychiatric/Behavioral: Negative.     Objective:   Objective  BP (!) 148/94 (BP Location: Left Arm, Patient Position: Sitting, Cuff Size: Normal)   Pulse 63   Temp 98.3 F (36.8 C) (Oral)   Resp 16   Ht 5\' 6"  (1.676 m)   Wt 170 lb (77.1 kg)   SpO2 100%   BMI 27.44 kg/m    Physical Exam  Constitutional: He is oriented to person, place, and time. He appears well-developed and well-nourished. No distress.  HENT:  Head: Normocephalic and atraumatic.  Eyes: Pupils are equal, round, and reactive to light. Conjunctivae and EOM are normal.  Neck: Normal range of motion.  Cardiovascular: Normal rate, regular rhythm, normal heart sounds and intact distal pulses.  Pulmonary/Chest: Effort normal and breath sounds normal. No respiratory distress.  Abdominal: Soft. Bowel sounds are normal. He exhibits no distension.  Musculoskeletal: Normal range of motion.  Neurological: He is alert and oriented to  person, place, and time.  Skin: Skin is warm and dry.  Psychiatric: He has a normal mood and affect. His behavior is normal. Thought content normal.  Nursing note and vitals reviewed.    Assessment/Plan:   Assessment   Encounter Diagnoses  Name Primary?  . Sickle cell anemia with pain ([place]) Yes  . Type 2 diabetes mellitus without complication, without long-term current use of insulin ([place])      Plan  1. Sickle cell anemia with pain (HCC) Pending labs. Will adjust medications accordingly.    - Urinalysis Dipstick - CBC With Differential - Comprehensive metabolic panel - VITAMIN D 25 Hydroxy (Vit-D Deficiency, Fractures)  2. Type 2 diabetes mellitus without complication, without long-term current use of insulin ([place]) Pending labs. Will adjust medications accordingly.    - Fructosamine   Return to care as scheduled and prn. Patient verbalized understanding and agreed with plan of care.   1. Sickle cell disease -  We discussed the need for good hydration, monitoring of hydration status, avoidance of heat, cold, stress, and infection triggers. We discussed the risks and benefits of Hydrea, including bone marrow suppression, the possibility of GI upset, skin ulcers, hair thinning, and teratogenicity. The patient was reminded of the need to seek medical attention of any symptoms of bleeding, anemia, or infection. Continue folic acid 1 mg daily to prevent aplastic bone marrow crises.   2. Pulmonary evaluation - Patient denies severe recurrent wheezes, shortness of breath with exercise, or persistent cough. If these symptoms develop, pulmonary function tests with spirometry will  be ordered, and if abnormal, plan on referral to Pulmonology for further evaluation.  3. Cardiac - Routine screening for pulmonary hypertension is not recommended.  4. Eye - High risk of proliferative retinopathy. Annual eye exam with retinal exam recommended to patient.  5. Immunization status -  Yearly  influenza vaccination is recommended, as well as being up to date with Meningococcal and Pneumococcal vaccines.   6. Acute and chronic painful episodes - We discussed that pt is to receive Schedule II prescriptions only from [place]. Pt is also aware that the prescription history is available to [place] online through the [facility] CSRS. Controlled substance agreement signed . We reminded Agastya Meister that all patients receiving Schedule II narcotics must be seen for follow within one month of prescription being requested. We reviewed the terms of our pain agreement, including the need to keep medicines in a safe locked location away from children or pets, and the need to report excess sedation or constipation, measures to avoid constipation, and policies related to early refills and stolen prescriptions. According to the [place] Chronic Pain Initiative program, we have reviewed details related to analgesia, adverse effects, aberrant behaviors.  7. Iron overload from chronic transfusion.  Not applicable at this time.  If this occurs will use Exjade for management.   8. Vitamin D deficiency - Drisdol 50,000 units weekly. Patient encouraged to take as prescribed.   The above recommendations are taken from the NIH Evidence-Based Management of Sickle Cell Disease: Expert Panel Report, 20149.   Ms. Andr L. Nathaneil Canary, FNP-BC Patient [facility] [address] [place], [place] [address] [phone]  This note has been created with Dragon speech recognition software and smart phrase technology. Any transcriptional errors are unintentional.

## 2017-11-09 NOTE — Patient Instructions (Signed)
Sickle Cell Anemia, Adult °Sickle cell anemia is a condition where your red blood cells are shaped like sickles. Red blood cells carry oxygen through the body. Sickle-shaped red blood cells do not live as long as normal red blood cells. They also clump together and block blood from flowing through the blood vessels. These things prevent the body from getting enough oxygen. Sickle cell anemia causes organ damage and pain. It also increases the risk of infection. °Follow these instructions at home: °· Drink enough fluid to keep your pee (urine) clear or pale yellow. Drink more in hot weather and during exercise. °· Do not smoke. Smoking lowers oxygen levels in the blood. °· Only take over-the-counter or prescription medicines as told by your doctor. °· Take antibiotic medicines as told by your doctor. Make sure you finish them even if you start to feel better. °· Take supplements as told by your doctor. °· Consider wearing a medical alert bracelet. This tells anyone caring for you in an emergency of your condition. °· When traveling, keep your medical information, doctors' names, and the medicines you take with you at all times. °· If you have a fever, do not take fever medicines right away. This could cover up a problem. Tell your doctor. °· Keep all follow-up visits with your doctor. Sickle cell anemia requires regular medical care. °Contact a doctor if: °You have a fever. °Get help right away if: °· You feel dizzy or faint. °· You have new belly (abdominal) pain, especially on the left side near the stomach area. °· You have a lasting, often uncomfortable and painful erection of the penis (priapism). If it is not treated right away, you will become unable to have sex (impotence). °· You have numbness in your arms or legs or you have a hard time moving them. °· You have a hard time talking. °· You have a fever or lasting symptoms for more than 2-3 days. °· You have a fever and your symptoms suddenly get  worse. °· You have signs or symptoms of infection. These include: °? Chills. °? Being more tired than normal (lethargy). °? Irritability. °? Poor eating. °? Throwing up (vomiting). °· You have pain that is not helped with medicine. °· You have shortness of breath. °· You have pain in your chest. °· You are coughing up pus-like or bloody mucus. °· You have a stiff neck. °· Your feet or hands swell or have pain. °· Your belly looks bloated. °· Your joints hurt. °This information is not intended to replace advice given to you by your health care provider. Make sure you discuss any questions you have with your health care provider. °Document Released: 11/03/2012 Document Revised: 06/21/2015 Document Reviewed: 08/25/2012 °Elsevier Interactive Patient Education © 2017 [employer] Inc. ° °

## 2017-11-10 LAB — CBC WITH DIFFERENTIAL
Basophils Absolute: 0.2 10*3/uL (ref 0.0–0.2)
Basos: 1 %
EOS (ABSOLUTE): 0.8 10*3/uL — ABNORMAL HIGH (ref 0.0–0.4)
Eos: 5 %
Hematocrit: 37.6 % (ref 37.5–51.0)
Hemoglobin: 13 g/dL (ref 13.0–17.7)
Immature Grans (Abs): 0.1 10*3/uL (ref 0.0–0.1)
Immature Granulocytes: 1 %
Lymphocytes Absolute: 6.5 10*3/uL — ABNORMAL HIGH (ref 0.7–3.1)
Lymphs: 40 %
MCH: 28.6 pg (ref 26.6–33.0)
MCHC: 34.6 g/dL (ref 31.5–35.7)
MCV: 83 fL (ref 79–97)
Monocytes Absolute: 0.9 10*3/uL (ref 0.1–0.9)
Monocytes: 5 %
NRBC: 1 % — ABNORMAL HIGH (ref 0–0)
Neutrophils Absolute: 7.8 10*3/uL — ABNORMAL HIGH (ref 1.4–7.0)
Neutrophils: 48 %
RBC: 4.54 x10E6/uL (ref 4.14–5.80)
RDW: 16.8 % — ABNORMAL HIGH (ref 12.3–15.4)
WBC: 16.2 10*3/uL — ABNORMAL HIGH (ref 3.4–10.8)

## 2017-11-10 LAB — COMPREHENSIVE METABOLIC PANEL
ALT: 40 IU/L (ref 0–44)
AST: 32 IU/L (ref 0–40)
Albumin/Globulin Ratio: 1.4 (ref 1.2–2.2)
Albumin: 4.7 g/dL (ref 3.5–5.5)
Alkaline Phosphatase: 118 IU/L — ABNORMAL HIGH (ref 39–117)
BUN/Creatinine Ratio: 11 (ref 9–20)
BUN: 8 mg/dL (ref 6–24)
Bilirubin Total: 3 mg/dL — ABNORMAL HIGH (ref 0.0–1.2)
CO2: 20 mmol/L (ref 20–29)
Calcium: 10.1 mg/dL (ref 8.7–10.2)
Chloride: 105 mmol/L (ref 96–106)
Creatinine, Ser: 0.74 mg/dL — ABNORMAL LOW (ref 0.76–1.27)
GFR calc Af Amer: 132 mL/min/{1.73_m2} (ref >59)
GFR calc non Af Amer: 114 mL/min/{1.73_m2} (ref >59)
Globulin, Total: 3.3 g/dL (ref 1.5–4.5)
Glucose: 62 mg/dL — ABNORMAL LOW (ref 65–99)
Potassium: 4.2 mmol/L (ref 3.5–5.2)
Sodium: 141 mmol/L (ref 134–144)
Total Protein: 8 g/dL (ref 6.0–8.5)

## 2017-11-10 LAB — VITAMIN D 25 HYDROXY (VIT D DEFICIENCY, FRACTURES): Vit D, 25-Hydroxy: 37.5 ng/mL (ref 30.0–100.0)

## 2017-11-10 LAB — RETICULOCYTES: Retic Ct Pct: 9.4 % — ABNORMAL HIGH (ref 0.6–2.6)

## 2017-11-10 LAB — FRUCTOSAMINE: Fructosamine: 339 umol/L — ABNORMAL HIGH (ref 0–285)

## 2017-11-22 ENCOUNTER — Encounter: Payer: Self-pay | Admitting: Family Medicine

## 2017-11-22 NOTE — Progress Notes (Signed)
Please print this lab letter and send to [facility]

## 2017-11-30 ENCOUNTER — Telehealth: Payer: Self-pay

## 2017-11-30 DIAGNOSIS — E119 Type 2 diabetes mellitus without complications: Secondary | ICD-10-CM

## 2017-12-01 MED ORDER — METFORMIN HCL 1000 MG PO TABS: 1000.0000 mg | ORAL_TABLET | Freq: Two times a day (BID) | ORAL | 0 refills | Status: DC

## 2017-12-01 NOTE — Telephone Encounter (Signed)
Refill request for oxycodone. LOV 11/09/2017. Please advise.

## 2017-12-03 ENCOUNTER — Other Ambulatory Visit: Payer: Self-pay | Admitting: Family Medicine

## 2017-12-03 DIAGNOSIS — G8929 Other chronic pain: Secondary | ICD-10-CM

## 2017-12-03 DIAGNOSIS — D5700 Hb-SS disease with crisis, unspecified: Secondary | ICD-10-CM

## 2017-12-03 DIAGNOSIS — M7918 Myalgia, other site: Principal | ICD-10-CM

## 2017-12-03 MED ORDER — OXYCODONE HCL 20 MG PO TABS: ORAL_TABLET | ORAL | 0 refills | Status: DC

## 2017-12-03 NOTE — Progress Notes (Unsigned)
Rx for Oxy-IR sent to pharmacy today.  

## 2017-12-04 NOTE — Telephone Encounter (Signed)
-----   Message from Azzie Glatter, [place] sent at 12/03/2017 10:22 PM EST ----- Regarding: "Refill" Rx for Oxy-IR sent to pharmacy today. Please inform patient.   Thank you.

## 2017-12-04 NOTE — Telephone Encounter (Signed)
Called, no answer. Left a voicemail that medication has been sent into pharmacy. Thanks!

## 2018-01-11 ENCOUNTER — Encounter: Payer: Self-pay | Admitting: Family Medicine

## 2018-01-11 ENCOUNTER — Non-Acute Institutional Stay (HOSPITAL_COMMUNITY)
Admission: AD | Admit: 2018-01-11 | Discharge: 2018-01-11 | Disposition: A | Discharge status: Home / Self Care | Payer: Medicaid Other | Source: Ambulatory Visit | Attending: Internal Medicine | Admitting: Internal Medicine

## 2018-01-11 ENCOUNTER — Ambulatory Visit (INDEPENDENT_AMBULATORY_CARE_PROVIDER_SITE_OTHER): Payer: Self-pay | Admitting: Family Medicine

## 2018-01-11 VITALS — BP 133/81 | HR 84 | Temp 98.3°F | Resp 16 | Ht 66.0 in | Wt 169.0 lb

## 2018-01-11 DIAGNOSIS — Z7984 Long term (current) use of oral hypoglycemic drugs: Secondary | ICD-10-CM | POA: Insufficient documentation

## 2018-01-11 DIAGNOSIS — E119 Type 2 diabetes mellitus without complications: Secondary | ICD-10-CM | POA: Insufficient documentation

## 2018-01-11 DIAGNOSIS — J4520 Mild intermittent asthma, uncomplicated: Secondary | ICD-10-CM | POA: Insufficient documentation

## 2018-01-11 DIAGNOSIS — Z79899 Other long term (current) drug therapy: Secondary | ICD-10-CM | POA: Insufficient documentation

## 2018-01-11 DIAGNOSIS — D5700 Hb-SS disease with crisis, unspecified: Secondary | ICD-10-CM | POA: Diagnosis present

## 2018-01-11 DIAGNOSIS — Z882 Allergy status to sulfonamides status: Secondary | ICD-10-CM | POA: Insufficient documentation

## 2018-01-11 DIAGNOSIS — Z886 Allergy status to analgesic agent status: Secondary | ICD-10-CM | POA: Insufficient documentation

## 2018-01-11 LAB — CBC WITH DIFFERENTIAL/PLATELET
ABS IMMATURE GRANULOCYTES: 0.36 10*3/uL — AB (ref 0.00–0.07)
Basophils Absolute: 0.2 10*3/uL — ABNORMAL HIGH (ref 0.0–0.1)
Basophils Relative: 1 %
Eosinophils Absolute: 0.4 10*3/uL (ref 0.0–0.5)
Eosinophils Relative: 2 %
HCT: 38.8 % — ABNORMAL LOW (ref 39.0–52.0)
Hemoglobin: 13.3 g/dL (ref 13.0–17.0)
IMMATURE GRANULOCYTES: 2 %
Lymphocytes Relative: 20 %
Lymphs Abs: 4.2 10*3/uL — ABNORMAL HIGH (ref 0.7–4.0)
MCH: 28.6 pg (ref 26.0–34.0)
MCHC: 34.3 g/dL (ref 30.0–36.0)
MCV: 83.4 fL (ref 80.0–100.0)
MONOS PCT: 7 %
Monocytes Absolute: 1.4 10*3/uL — ABNORMAL HIGH (ref 0.1–1.0)
NEUTROS PCT: 68 %
Neutro Abs: 14.1 10*3/uL — ABNORMAL HIGH (ref 1.7–7.7)
Platelets: 373 10*3/uL (ref 150–400)
RBC: 4.65 MIL/uL (ref 4.22–5.81)
RDW: 16.9 % — ABNORMAL HIGH (ref 11.5–15.5)
WBC: 20.6 10*3/uL — ABNORMAL HIGH (ref 4.0–10.5)
nRBC: 1.9 % — ABNORMAL HIGH (ref 0.0–0.2)

## 2018-01-11 LAB — POCT URINALYSIS DIPSTICK
Bilirubin, UA: NEGATIVE
Blood, UA: NEGATIVE
Glucose, UA: NEGATIVE
Ketones, UA: NEGATIVE
Leukocytes, UA: NEGATIVE
Nitrite, UA: NEGATIVE
Protein, UA: NEGATIVE
Spec Grav, UA: 1.01 (ref 1.010–1.025)
Urobilinogen, UA: 1 E.U./dL
pH, UA: 7 (ref 5.0–8.0)

## 2018-01-11 LAB — COMPREHENSIVE METABOLIC PANEL
ALBUMIN: 4.6 g/dL (ref 3.5–5.0)
ALT: 40 U/L (ref 0–44)
AST: 34 U/L (ref 15–41)
Alkaline Phosphatase: 95 U/L (ref 38–126)
Anion gap: 11 (ref 5–15)
BILIRUBIN TOTAL: 3.1 mg/dL — AB (ref 0.3–1.2)
BUN: 10 mg/dL (ref 6–20)
CO2: 23 mmol/L (ref 22–32)
Calcium: 9.8 mg/dL (ref 8.9–10.3)
Chloride: 105 mmol/L (ref 98–111)
Creatinine, Ser: 0.75 mg/dL (ref 0.61–1.24)
GFR calc Af Amer: >60 mL/min (ref >60)
GFR calc non Af Amer: >60 mL/min (ref >60)
GLUCOSE: 115 mg/dL — AB (ref 70–99)
POTASSIUM: 4.2 mmol/L (ref 3.5–5.1)
Sodium: 139 mmol/L (ref 135–145)
Total Protein: 8.7 g/dL — ABNORMAL HIGH (ref 6.5–8.1)

## 2018-01-11 LAB — RETICULOCYTES
Immature Retic Fract: 37.4 % — ABNORMAL HIGH (ref 2.3–15.9)
RBC.: 4.65 MIL/uL (ref 4.22–5.81)
RETIC CT PCT: 6.8 % — AB (ref 0.4–3.1)
Retic Count, Absolute: 306.4 10*3/uL — ABNORMAL HIGH (ref 19.0–186.0)

## 2018-01-11 MED ORDER — ACETAMINOPHEN 500 MG PO TABS
500.0000 mg | ORAL_TABLET | Freq: Once | ORAL | Status: AC
  Administered 2018-01-11: 500 mg via ORAL
  Filled 2018-01-11: qty 1

## 2018-01-11 MED ORDER — SODIUM CHLORIDE 0.45 % IV BOLUS: 500.0000 mL | Freq: Once | INTRAVENOUS | Status: DC

## 2018-01-11 MED ORDER — IBUPROFEN 400 MG PO TABS
800.0000 mg | ORAL_TABLET | Freq: Once | ORAL | Status: AC
  Administered 2018-01-11: 800 mg via ORAL
  Filled 2018-01-11: qty 2

## 2018-01-11 MED ORDER — KETOROLAC TROMETHAMINE 30 MG/ML IJ SOLN: 15.0000 mg | Freq: Once | INTRAMUSCULAR | Status: DC

## 2018-01-11 MED ORDER — DIPHENHYDRAMINE HCL 25 MG PO CAPS: 25.0000 mg | ORAL_CAPSULE | ORAL | Status: DC | PRN

## 2018-01-11 MED ORDER — ONDANSETRON HCL 4 MG/2ML IJ SOLN: 4.0000 mg | Freq: Four times a day (QID) | INTRAMUSCULAR | Status: DC | PRN

## 2018-01-11 NOTE — H&P (Signed)
[facility] History and Physical   Date: 01/11/2018  Patient name: Gregory Bryant Medical record number: [id number] Date of birth: [birth year] Age: 42 y.o. Gender: male PCP: Lanae Boast, [place]  Attending physician: Tresa Garter, MD  Chief Complaint: Right shoulder pain  History of Present Illness: Gregory Bryant, a 42 year old male with a medical history significant for sickle cell anemia, type 2 diabetes mellitus, avascular necrosis, and mild intermittent asthma presents complaining of pain primarily to right shoulder for 3 days.  Patient attributes current sickle cell crisis to changes in weather.  Patient also endorses nausea and vomiting over the past 24 hours.  Patient typically takes oxycodone 20 mg at home, but has not taken any over the past 24 hours due to vomiting.  Patient is mostly opiate nave, he only takes oxycodone with sickle cell crisis which is infrequent.  Patient denies any sick contacts.  He was evaluated in primary care this a.m.  Agree with primary provider that patient is appropriate to transition to sickle cell clinic for pain management and extended observation.  Current pain intensity is 4/10 characterized with intermittent and aching. Patient currently denies fatigue, shortness of breath, chest pain, dysuria, diarrhea, or constipation.  Meds: Medications Prior to Admission  Medication Sig Dispense Refill Last Dose  . cetirizine-pseudoephedrine (ZYRTEC-D) 5-120 MG per tablet Take 1 tablet by mouth 2 (two) times daily. (Patient taking differently: Take 1 tablet by mouth 2 (two) times daily as needed for allergies. ) 30 tablet 0 Taking  . folic acid (FOLVITE) 1 MG tablet Take 1 tablet (1 mg total) by mouth daily. 30 tablet 11 Taking  . glipiZIDE (GLUCOTROL) 10 MG tablet Take 1 tablet (10 mg total) by mouth daily before breakfast. 30 tablet 5 Taking  . ibuprofen (ADVIL,MOTRIN) 600 MG tablet Take 1 tablet (600 mg total) by mouth every 8  (eight) hours as needed. 30 tablet 2 Taking  . metFORMIN (GLUCOPHAGE) 1000 MG tablet Take 1 tablet (1,000 mg total) by mouth 2 (two) times daily with a meal. 180 tablet 0 Taking  . ondansetron (ZOFRAN ODT) 8 MG disintegrating tablet Take 1 tablet (8 mg total) by mouth every 8 (eight) hours as needed for nausea or vomiting. 20 tablet 0 Taking  . Oxycodone HCl 20 MG TABS TAKE 1 TABLET BY MOUTH 4 TIMES A DAY AS NEEDED FOR PAIN 60 tablet 0 Taking  . Vitamin D, Ergocalciferol, (DRISDOL) 50000 units CAPS capsule Take 50,000 Units by mouth every 7 (seven) days.   Taking    Allergies: Aspirin and Sulfa antibiotics Past Medical History:  Diagnosis Date  . Diabetes mellitus without complication ([place])   . Sickle cell anemia (HCC)    Past Surgical History:  Procedure Laterality Date  . CHOLECYSTECTOMY     Family History  Problem Relation Age of Onset  . Diabetes Mother    Social History   Socioeconomic History  . Marital status: Divorced    Spouse name: Not on file  . Number of children: Not on file  . Years of education: Not on file  . Highest education level: Not on file  Occupational History  . Not on file  Social Needs  . Financial resource strain: Not on file  . Food insecurity:    Worry: Not on file    Inability: Not on file  . Transportation needs:    Medical: Not on file    Non-medical: Not on file  Tobacco Use  . Smoking status: Never Smoker  .  Smokeless tobacco: Never Used  Substance and Sexual Activity  . Alcohol use: No  . Drug use: No  . Sexual activity: Not on file  Lifestyle  . Physical activity:    Days per week: Not on file    Minutes per session: Not on file  . Stress: Not on file  Relationships  . Social connections:    Talks on phone: Not on file    Gets together: Not on file    Attends religious service: Not on file    Active member of club or organization: Not on file    Attends meetings of clubs or organizations: Not on file    Relationship  status: Not on file  . Intimate partner violence:    Fear of current or ex partner: Not on file    Emotionally abused: Not on file    Physically abused: Not on file    Forced sexual activity: Not on file  Other Topics Concern  . Not on file  Social History Narrative  . Not on file   Review of Systems  Constitutional: Negative for chills, fever and malaise/fatigue.  Respiratory: Negative for sputum production.   Cardiovascular: Negative for chest pain.  Gastrointestinal: Positive for nausea and vomiting.  Genitourinary: Negative.   Musculoskeletal: Positive for joint pain.  Neurological: Negative.   Endo/Heme/Allergies: Negative.   Psychiatric/Behavioral: Negative.     Physical Exam: There were no vitals taken for this visit. Physical Exam Constitutional:      Appearance: Normal appearance.  HENT:     Head: Normocephalic.     Nose: Nose normal.     Mouth/Throat:     Mouth: Mucous membranes are moist.  Neck:     Musculoskeletal: Normal range of motion and neck supple.  Cardiovascular:     Rate and Rhythm: Normal rate and regular rhythm.  Pulmonary:     Effort: Pulmonary effort is normal.     Breath sounds: Normal breath sounds.  Musculoskeletal: Normal range of motion.  Skin:    General: Skin is warm and dry.  Neurological:     General: No focal deficit present.     Mental Status: He is alert.  Psychiatric:        Mood and Affect: Mood normal.        Thought Content: Thought content normal.        Judgment: Judgment normal.     Lab results: No results found for this or any previous visit (from the past 24 hour(s)).  Imaging results:  No results found.   Assessment & Plan:  Patient will be admitted to the day infusion center for extended observation  There 0.45% saline bolus for a total of 500 mL over 1 hour  Toradol 15 mg x 1  Tylenol 650 mg x 1  Patient will be re-evaluated for pain intensity in the context of function and relationship to baseline  as care progresses.  If no significant pain relief, will transfer patient to inpatient services for a higher level of care.   Will check CMP, reticulocytes and CBC w/differential  Donia Pounds  APRN, MSN, FNP-C [facility] [address] [place], [place] [address] [phone]  01/11/2018, 9:58 AM

## 2018-01-11 NOTE — Discharge Instructions (Signed)
Continue to hydrate with 64 ounces of fluid per day.  Continue Ibuprofen and Oxycodone as previously prescribed for greater pain control.    The patient was given clear instructions to go to ER or return to medical center if symptoms do not improve, worsen or new problems develop. The patient verbalized understanding.     Sickle Cell Anemia, Adult Sickle cell anemia is a condition where your red blood cells are shaped like sickles. Red blood cells carry oxygen through the body. Sickle-shaped red blood cells do not live as long as normal red blood cells. They also clump together and block blood from flowing through the blood vessels. These things prevent the body from getting enough oxygen. Sickle cell anemia causes organ damage and pain. It also increases the risk of infection. Follow these instructions at home:  Drink enough fluid to keep your pee (urine) clear or pale yellow. Drink more in hot weather and during exercise.  Do not smoke. Smoking lowers oxygen levels in the blood.  Only take over-the-counter or prescription medicines as told by your doctor.  Take antibiotic medicines as told by your doctor. Make sure you finish them even if you start to feel better.  Take supplements as told by your doctor.  Consider wearing a medical alert bracelet. This tells anyone caring for you in an emergency of your condition.  When traveling, keep your medical information, doctors' names, and the medicines you take with you at all times.  If you have a fever, do not take fever medicines right away. This could cover up a problem. Tell your doctor.  Keep all follow-up visits with your doctor. Sickle cell anemia requires regular medical care. Contact a doctor if: You have a fever. Get help right away if:  You feel dizzy or faint.  You have new belly (abdominal) pain, especially on the left side near the stomach area.  You have a lasting, often uncomfortable and painful erection of the penis  (priapism). If it is not treated right away, you will become unable to have sex (impotence).  You have numbness in your arms or legs or you have a hard time moving them.  You have a hard time talking.  You have a fever or lasting symptoms for more than 2-3 days.  You have a fever and your symptoms suddenly get worse.  You have signs or symptoms of infection. These include: ? Chills. ? Being more tired than normal (lethargy). ? Irritability. ? Poor eating. ? Throwing up (vomiting).  You have pain that is not helped with medicine.  You have shortness of breath.  You have pain in your chest.  You are coughing up pus-like or bloody mucus.  You have a stiff neck.  Your feet or hands swell or have pain.  Your belly looks bloated.  Your joints hurt. This information is not intended to replace advice given to you by your health care provider. Make sure you discuss any questions you have with your health care provider. Document Released: 11/03/2012 Document Revised: 06/21/2015 Document Reviewed: 08/25/2012 Elsevier Interactive Patient Education  2017 [employer].

## 2018-01-11 NOTE — Progress Notes (Signed)
Patient admitted to the day hospital from Primary Care appointment. Patient reported pain in bilateral arms rated 3/10. Unable to obtain IV access. Multiple attempts made by IV team. Patient given 500 mg Tylenol, 800 mg Ibuprofen and encouraged to hydrated with 64 ounces of fluids. At discharge, patient's vital signs stable. Discharge instructions given to patient. Patient can come back to the day hospital tomorrow for treatment if needed. Alert, oriented and ambulatory at discharge.

## 2018-01-11 NOTE — Progress Notes (Signed)
PATIENT CARE CENTER INTERNAL MEDICINE AND SICKLE CELL CARE  SICKLE CELL ANEMIA FOLLOW UP VISIT PROVIDER: Lanae Boast, FNP    Subjective:   Gregory Bryant  is a 42 y.o.  male who  has a past medical history of Diabetes mellitus without complication ([place]) and Sickle cell anemia ([place]). presents for a follow up for Sickle Cell Anemia. The patient has had 0 admissions in the past 6 months.  Pain regimen includes: Ibuprofen and oxycodone  Hydrea Therapy: No Medication compliance: Yes  Pain today is 7/10 and is in bilateral arms. This is where he normally has pain in crisis.  Patient reports adequate hydration. He states that he had 1 episode of emesis this AM. Has not taken any medications this morning due to this.   Review of Systems  Constitutional: Negative.   HENT: Negative.   Eyes: Negative.   Respiratory: Negative.   Cardiovascular: Negative.   Gastrointestinal: Negative.   Genitourinary: Negative.   Musculoskeletal: Positive for joint pain and myalgias.  Skin: Negative.   Neurological: Negative.   Psychiatric/Behavioral: Negative.     Objective:   Objective  BP 133/81 (BP Location: Left Arm, Patient Position: Sitting, Cuff Size: Normal)   Pulse 84   Temp 98.3 F (36.8 C) (Oral)   Resp 16   Ht 5\' 6"  (1.676 m)   Wt 169 lb (76.7 kg)   SpO2 95%   BMI 27.28 kg/m   Wt Readings from Last 3 Encounters:  01/11/18 169 lb (76.7 kg)  11/09/17 170 lb (77.1 kg)  10/12/17 170 lb (77.1 kg)     Physical Exam Vitals signs and nursing note reviewed.  Constitutional:      General: He is not in acute distress.    Appearance: He is well-developed.  HENT:     Head: Normocephalic and atraumatic.  Eyes:     Conjunctiva/sclera: Conjunctivae normal.     Pupils: Pupils are equal, round, and reactive to light.  Neck:     Musculoskeletal: Normal range of motion.  Cardiovascular:     Rate and Rhythm: Normal rate and regular rhythm.     Heart sounds: Normal heart sounds.    Pulmonary:     Effort: Pulmonary effort is normal. No respiratory distress.     Breath sounds: Normal breath sounds.  Abdominal:     General: Bowel sounds are normal. There is no distension.     Palpations: Abdomen is soft.  Musculoskeletal: Normal range of motion.        General: Tenderness (Bilateral upper extremities. ) present.  Skin:    General: Skin is warm and dry.  Neurological:     General: No focal deficit present.     Mental Status: He is alert and oriented to person, place, and time.  Psychiatric:        Mood and Affect: Mood normal.        Behavior: Behavior normal.        Thought Content: Thought content normal.        Judgment: Judgment normal.      Assessment/Plan:   Assessment   Encounter Diagnosis  Name Primary?  . Sickle cell anemia with pain ([place]) Yes     Plan  1. Sickle cell anemia with pain [facility]) Patient sent to the day hospital for pain control.  - Urinalysis Dipstick   Return to care as scheduled and prn. Patient verbalized understanding and agreed with plan of care.   1. Sickle cell disease - We discussed the  need for good hydration, monitoring of hydration status, avoidance of heat, cold, stress, and infection triggers. We discussed the risks and benefits of Hydrea, including bone marrow suppression, the possibility of GI upset, skin ulcers, hair thinning, and teratogenicity. The patient was reminded of the need to seek medical attention of any symptoms of bleeding, anemia, or infection. Continue folic acid 1 mg daily to prevent aplastic bone marrow crises.   2. Pulmonary evaluation - Patient denies severe recurrent wheezes, shortness of breath with exercise, or persistent cough. If these symptoms develop, pulmonary function tests with spirometry will be ordered, and if abnormal, plan on referral to Pulmonology for further evaluation.  3. Cardiac - Routine screening for pulmonary hypertension is not recommended.  4. Eye - High risk of  proliferative retinopathy. Annual eye exam with retinal exam recommended to patient.  5. Immunization status -  Yearly influenza vaccination is recommended, as well as being up to date with Meningococcal and Pneumococcal vaccines.   6. Acute and chronic painful episodes - We discussed that pt is to receive Schedule II prescriptions only from [place]. Pt is also aware that the prescription history is available to [place] online through the [facility] CSRS. Controlled substance agreement signed. We reminded Kylan Liberati that all patients receiving Schedule II narcotics must be seen for follow within one month of prescription being requested. We reviewed the terms of our pain agreement, including the need to keep medicines in a safe locked location away from children or pets, and the need to report excess sedation or constipation, measures to avoid constipation, and policies related to early refills and stolen prescriptions. According to the [place] Chronic Pain Initiative program, we have reviewed details related to analgesia, adverse effects, aberrant behaviors.  7. Iron overload from chronic transfusion.  Not applicable at this time.  If this occurs will use Exjade for management.   8. Vitamin D deficiency - Drisdol 50,000 units weekly. Patient encouraged to take as prescribed.   The above recommendations are taken from the NIH Evidence-Based Management of Sickle Cell Disease: Expert Panel Report, 20149.   Ms. Andr L. Nathaneil Canary, FNP-BC [facility] [address] [place], [place] [address] [phone]  This note has been created with Dragon speech recognition software and smart phrase technology. Any transcriptional errors are unintentional.

## 2018-01-11 NOTE — Discharge Summary (Signed)
[facility] Discharge Summary   Patient ID: Gregory Bryant MRN: [id number] DOB/AGE: [birth year] 42 y.o.  Admit date: 01/11/2018 Discharge date: 01/11/2018  Primary Care Physician:  Lanae Boast, [place]  Admission Diagnoses:  Active Problems:   Sickle cell pain crisis [facility])   Discharge Medications:  Allergies as of 01/11/2018      Reactions   Aspirin Other (See Comments)   unknown   Sulfa Antibiotics Other (See Comments)   unknown      Medication List    TAKE these medications   cetirizine-pseudoephedrine 5-120 MG tablet Commonly known as:  ZYRTEC-D Take 1 tablet by mouth 2 (two) times daily. What changed:    when to take this  reasons to take this   folic acid 1 MG tablet Commonly known as:  FOLVITE Take 1 tablet (1 mg total) by mouth daily.   glipiZIDE 10 MG tablet Commonly known as:  GLUCOTROL Take 1 tablet (10 mg total) by mouth daily before breakfast.   ibuprofen 600 MG tablet Commonly known as:  ADVIL,MOTRIN Take 1 tablet (600 mg total) by mouth every 8 (eight) hours as needed.   metFORMIN 1000 MG tablet Commonly known as:  GLUCOPHAGE Take 1 tablet (1,000 mg total) by mouth 2 (two) times daily with a meal.   ondansetron 8 MG disintegrating tablet Commonly known as:  ZOFRAN ODT Take 1 tablet (8 mg total) by mouth every 8 (eight) hours as needed for nausea or vomiting.   Oxycodone HCl 20 MG Tabs TAKE 1 TABLET BY MOUTH 4 TIMES A DAY AS NEEDED FOR PAIN   Vitamin D (Ergocalciferol) 1.25 MG (50000 UT) Caps capsule Commonly known as:  DRISDOL Take 50,000 Units by mouth every 7 (seven) days.        Consults:  None  Significant Diagnostic Studies:  No results found.   [facility] Course:  Gregory Bryant, a 42 year old male with a medical history significant for sickle cell anemia, type 2 diabetes mellitus was admitted for pain management and extended observation.  Patient transition from primary care provider.   Patient has poor venous access and IV team RN was unable to obtain IV. Review laboratory values, WBCs 20.6.  Patient afebrile.  Suspected to be reactive Hemoglobin 13.3, which is consistent with baseline.  Patient hydrated with 64 ounces of fluids Ibuprofen 800 mg x 1 Tylenol 650 mg x 1 Patient declined opiate medications due to transportation constraints.  Pain intensity at discharge 4-5/10.  Patient will discharge home in stable condition.  Patient alert, oriented, and ambulating.  Discharge instructions: Resume all home medications Strict return precautions.  Patient advised to continue to hydrate with 64 ounces of fluid upon returning home.  He is advised to call the sickle cell clinic in a.m. for day admission.  Also if pain worsens, follow-up at [facility] long emergency department.  Avoid stressors that precipitate sickle cell crisis  Physical Exam at Discharge:  BP (!) 148/87 (BP Location: Left Arm)   Pulse 60   Temp 97.8 F (36.6 C) (Oral)   Resp 16   SpO2 93%  Physical Exam Constitutional:      Appearance: He is normal weight.  HENT:     Head: Normocephalic.     Nose: Nose normal.  Neck:     Musculoskeletal: Normal range of motion.  Abdominal:     General: Abdomen is flat.     Palpations: Abdomen is soft.  Musculoskeletal: Normal range of motion.  Skin:    General:  Skin is warm and dry.  Neurological:     General: No focal deficit present.     Mental Status: He is alert. Mental status is at baseline.  Psychiatric:        Mood and Affect: Mood normal.        Thought Content: Thought content normal.        Judgment: Judgment normal.      Disposition at Discharge: Discharge disposition: 01-Home or Self Care       Discharge Orders: Discharge Instructions    Discharge patient   Complete by:  As directed    Discharge disposition:  01-Home or Self Care   Discharge patient date:  01/11/2018      Condition at Discharge:   Stable  Time spent on  Discharge:  15 minutes  Signed:  Donia Pounds  APRN, MSN, FNP-C [facility] [address] [place], [place] [address] [phone]  01/11/2018, 4:39 PM

## 2018-02-10 ENCOUNTER — Telehealth: Payer: Self-pay

## 2018-02-10 ENCOUNTER — Ambulatory Visit (HOSPITAL_COMMUNITY)
Admission: RE | Admit: 2018-02-10 | Discharge: 2018-02-10 | Disposition: A | Discharge status: Home / Self Care | Payer: Medicaid Other | Source: Ambulatory Visit | Attending: Family Medicine | Admitting: Family Medicine

## 2018-02-10 ENCOUNTER — Ambulatory Visit (INDEPENDENT_AMBULATORY_CARE_PROVIDER_SITE_OTHER): Payer: Self-pay | Admitting: Family Medicine

## 2018-02-10 VITALS — BP 146/94 | HR 79 | Temp 97.9°F | Resp 16 | Ht 66.0 in | Wt 176.0 lb

## 2018-02-10 DIAGNOSIS — G8929 Other chronic pain: Secondary | ICD-10-CM

## 2018-02-10 DIAGNOSIS — D5700 Hb-SS disease with crisis, unspecified: Secondary | ICD-10-CM | POA: Insufficient documentation

## 2018-02-10 DIAGNOSIS — J4520 Mild intermittent asthma, uncomplicated: Secondary | ICD-10-CM

## 2018-02-10 DIAGNOSIS — M7918 Myalgia, other site: Secondary | ICD-10-CM

## 2018-02-10 DIAGNOSIS — M8700 Idiopathic aseptic necrosis of unspecified bone: Secondary | ICD-10-CM

## 2018-02-10 DIAGNOSIS — E119 Type 2 diabetes mellitus without complications: Secondary | ICD-10-CM

## 2018-02-10 LAB — POCT URINALYSIS DIPSTICK
Bilirubin, UA: NEGATIVE
Blood, UA: NEGATIVE
Glucose, UA: NEGATIVE
Ketones, UA: NEGATIVE
Leukocytes, UA: NEGATIVE
Nitrite, UA: NEGATIVE
Protein, UA: NEGATIVE
Spec Grav, UA: <=1.005 — AB (ref 1.010–1.025)
Urobilinogen, UA: 0.2 E.U./dL
pH, UA: 5.5 (ref 5.0–8.0)

## 2018-02-10 MED ORDER — IBUPROFEN 600 MG PO TABS: 600.0000 mg | ORAL_TABLET | Freq: Three times a day (TID) | ORAL | 2 refills | Status: DC | PRN

## 2018-02-10 MED ORDER — OXYCODONE HCL 20 MG PO TABS: ORAL_TABLET | ORAL | 0 refills | Status: DC

## 2018-02-10 NOTE — Patient Instructions (Signed)
Sickle Cell Anemia, Adult °Sickle cell anemia is a condition where your red blood cells are shaped like sickles. Red blood cells carry oxygen through the body. Sickle-shaped cells do not live as long as normal red blood cells. They also clump together and block blood from flowing through the blood vessels. This prevents the body from getting enough oxygen. Sickle cell anemia causes organ damage and pain. It also increases the risk of infection. °Follow these instructions at home: °Medicines °· Take over-the-counter and prescription medicines only as told by your doctor. °· If you were prescribed an antibiotic medicine, take it as told by your doctor. Do not stop taking the antibiotic even if you start to feel better. °· If you develop a fever, do not take medicines to lower the fever right away. Tell your doctor about the fever. °Managing pain, stiffness, and swelling °· Try these methods to help with pain: °? Use a heating pad. °? Take a warm bath. °? Distract yourself, such as by watching TV. °Eating and drinking °· Drink enough fluid to keep your pee (urine) clear or pale yellow. Drink more in hot weather and during exercise. °· Limit or avoid alcohol. °· Eat a healthy diet. Eat plenty of fruits, vegetables, whole grains, and lean protein. °· Take vitamins and supplements as told by your doctor. °Traveling °· When traveling, keep these with you: °? Your medical information. °? The names of your doctors. °? Your medicines. °· If you need to take an airplane, talk to your doctor first. °Activity °· Rest often. °· Avoid exercises that make your heart beat much faster, such as jogging. °General instructions °· Do not use products that have nicotine or tobacco, such as cigarettes and e-cigarettes. If you need help quitting, ask your doctor. °· Consider wearing a medical alert bracelet. °· Avoid being in high places (high altitudes), such as mountains. °· Avoid very hot or cold temperatures. °· Avoid places where the  temperature changes a lot. °· Keep all follow-up visits as told by your doctor. This is important. °Contact a doctor if: °· A joint hurts. °· Your feet or hands hurt or swell. °· You feel tired (fatigued). °Get help right away if: °· You have symptoms of infection. These include: °? Fever. °? Chills. °? Being very tired. °? Irritability. °? Poor eating. °? Throwing up (vomiting). °· You feel dizzy or faint. °· You have new stomach pain, especially on the left side. °· You have a an erection (priapism) that lasts more than 4 hours. °· You have numbness in your arms or legs. °· You have a hard time moving your arms or legs. °· You have trouble talking. °· You have pain that does not go away when you take medicine. °· You are short of breath. °· You are breathing fast. °· You have a long-term cough. °· You have pain in your chest. °· You have a bad headache. °· You have a stiff neck. °· Your stomach looks bloated even though you did not eat much. °· Your skin is pale. °· You suddenly cannot see well. °Summary °· Sickle cell anemia is a condition where your red blood cells are shaped like sickles. °· Follow your doctor's advice on ways to manage pain, food to eat, activities to do, and steps to take for safe travel. °· Get medical help right away if you have any signs of infection, such as a fever. °This information is not intended to replace advice given to you by your   health care provider. Make sure you discuss any questions you have with your health care provider. °Document Released: 11/03/2012 Document Revised: 02/19/2016 Document Reviewed: 02/19/2016 °Elsevier Interactive Patient Education © 2019 [employer] ° °

## 2018-02-10 NOTE — Telephone Encounter (Signed)
I have entered this in on [employer] tracks. Thanks!

## 2018-02-10 NOTE — Progress Notes (Signed)
PATIENT [facility]ARE CENTER INTERNAL MEDICINE AND SICKLE CELL CARE  SICKLE CELL ANEMIA FOLLOW UP VISIT PROVIDER: Lanae Boast, FNP    Subjective:   Gregory Bryant  is a 43 y.o.  male who  has a past medical history of Diabetes mellitus without complication ([place]) and Sickle cell anemia ([place]). presents for a follow up for Sickle Cell Anemia. The patient has had 1 admissions in the past 6 months.  Pain regimen includes: Ibuprofen and oxycodone  Hydrea Therapy: No Medication compliance: Yes  Pain today is 0/10. 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 Has been seen by ortho in the past and is requesting xrays today and possible referral .  The patient reports adequate daily hydration.     Review of Systems  Constitutional: Negative.   HENT: Negative.   Eyes: Negative.   Respiratory: Negative.   Cardiovascular: Negative.   Gastrointestinal: Negative.   Genitourinary: Negative.   Musculoskeletal: Positive for joint pain (Bilateral shoulder pain).  Skin: Negative.   Neurological: Negative.   Psychiatric/Behavioral: Negative.     Objective:   Objective  BP (!) 146/94 (BP Location: Left Arm, Patient Position: Sitting, Cuff Size: Normal) Comment: manually  Pulse 79   Temp 97.9 F (36.6 C) (Oral)   Resp 16   Ht 5\' 6"  (1.676 m)   Wt 176 lb (79.8 kg)   SpO2 97%   BMI 28.41 kg/m   Wt Readings from Last 3 Encounters:  02/10/18 176 lb (79.8 kg)  01/11/18 169 lb (76.7 kg)  11/09/17 170 lb (77.1 kg)     Physical Exam Vitals signs and nursing note reviewed.  Constitutional:      General: He is not in acute distress.    Appearance: He is well-developed.  HENT:     Head: Normocephalic and atraumatic.  Eyes:     Conjunctiva/sclera: Conjunctivae normal.     Pupils: Pupils are equal, round, and reactive to light.  Neck:     Musculoskeletal: Normal range of motion.  Cardiovascular:     Rate and Rhythm: Normal rate and regular rhythm.     Pulses: Normal pulses.     Heart sounds:  Normal heart sounds. No murmur.  Pulmonary:     Effort: Pulmonary effort is normal. No respiratory distress.     Breath sounds: Normal breath sounds.  Abdominal:     General: Bowel sounds are normal. There is no distension.     Palpations: Abdomen is soft.  Musculoskeletal: Normal range of motion.        General: Tenderness: bilateral shoulders.      Right shoulder: He exhibits tenderness.     Left shoulder: He exhibits tenderness.  Skin:    General: Skin is warm and dry.  Neurological:     Mental Status: He is alert and oriented to person, place, and time.  Psychiatric:        Mood and Affect: Mood normal.        Behavior: Behavior normal.        Thought Content: Thought content normal.        Judgment: Judgment normal.      Assessment/Plan:   Assessment   Encounter Diagnoses  Name Primary?  . Type 2 diabetes mellitus without complication, without long-term current use of insulin ([place]) Yes  . Sickle cell anemia with pain (H[facility])      Plan  1. Type 2 diabetes mellitus without complication, without long-term current use of insulin (HCC) No medication changes warranted at the present time.   -  Urinalysis Dipstick  2. Sickle cell anemia with pain (HCC) Pending labs. Will adjust medications accordingly.    - Urinalysis Dipstick - Oxycodone HCl 20 MG TABS; TAKE 1 TABLET BY MOUTH 4 TIMES A DAY AS NEEDED FOR PAIN  Dispense: 60 tablet; Refill: 0 - ibuprofen (ADVIL,MOTRIN) 600 MG tablet; Take 1 tablet (600 mg total) by mouth every 8 (eight) hours as needed.  Dispense: 30 tablet; Refill: 2 - CBC with Differential - DG Shoulder Left; Future - DG Shoulder Right; Future  3. Chronic musculoskeletal pain Refilled medications. Will refer to ortho for further evaluation of shoulder pain and AVN - Oxycodone HCl 20 MG TABS; TAKE 1 TABLET BY MOUTH 4 TIMES A DAY AS NEEDED FOR PAIN  Dispense: 60 tablet; Refill: 0  4. Mild intermittent asthma without complication No medication changes  warranted at the present time.    5. Avascular necrosis ([place]) Results pending - DG Shoulder Left; Future - DG Shoulder Right; Future    Return to care as scheduled and prn. Patient verbalized understanding and agreed with plan of care.   1. Sickle cell disease -We discussed the need for good hydration, monitoring of hydration status, avoidance of heat, cold, stress, and infection triggers. We discussed the risks and benefits of Hydrea, including bone marrow suppression, the possibility of GI upset, skin ulcers, hair thinning, and teratogenicity. The patient was reminded of the need to seek medical attention of any symptoms of bleeding, anemia, or infection. Continue folic acid 1 mg daily to prevent aplastic bone marrow crises.   2. Pulmonary evaluation - Patient denies severe recurrent wheezes, shortness of breath with exercise, or persistent cough. If these symptoms develop, pulmonary function tests with spirometry will be ordered, and if abnormal, plan on referral to Pulmonology for further evaluation.  3. Cardiac - Routine screening for pulmonary hypertension is not recommended.  4. Eye - High risk of proliferative retinopathy. Annual eye exam with retinal exam recommended to patient.  5. Immunization status -  Yearly influenza vaccination is recommended, as well as being up to date with Meningococcal and Pneumococcal vaccines.   6. Acute and chronic painful episodes - We discussed that pt is to receive Schedule II prescriptions only from [place]. Pt is also aware that the prescription history is available to [place] online through the [facility] CSRS. Controlled substance agreement signed. We reminded [name] that all patients receiving Schedule II narcotics must be seen for follow within one month of prescription being requested. We reviewed the terms of our pain agreement, including the need to keep medicines in a safe locked location away from children or pets, and the need to report excess  sedation or constipation, measures to avoid constipation, and policies related to early refills and stolen prescriptions. According to the [place] Chronic Pain Initiative program, we have reviewed details related to analgesia, adverse effects, aberrant behaviors.  7. Iron overload from chronic transfusion.  Not applicable at this time.  If this occurs will use Exjade for management.   8. Vitamin D deficiency - Drisdol 50,000 units weekly. Patient encouraged to take as prescribed.   The above recommendations are taken from the NIH Evidence-Based Management of Sickle Cell Disease: Expert Panel Report, 20149.   Ms. Andr L. Nathaneil Canary, FNP-BC [facility] [address] [place], [place] [address] [phone]  This note has been created with Dragon speech recognition software and smart phrase technology. Any transcriptional errors are unintentional.

## 2018-02-11 LAB — CBC WITH DIFFERENTIAL/PLATELET
Basophils Absolute: 0.2 10*3/uL (ref 0.0–0.2)
Basos: 1 %
EOS (ABSOLUTE): 1.9 10*3/uL — ABNORMAL HIGH (ref 0.0–0.4)
Eos: 10 %
Hematocrit: 41 % (ref 37.5–51.0)
Hemoglobin: 13.8 g/dL (ref 13.0–17.7)
Immature Grans (Abs): 0.8 10*3/uL — ABNORMAL HIGH (ref 0.0–0.1)
Immature Granulocytes: 4 %
Lymphocytes Absolute: 4.6 10*3/uL — ABNORMAL HIGH (ref 0.7–3.1)
Lymphs: 24 %
MCH: 27.9 pg (ref 26.6–33.0)
MCHC: 33.7 g/dL (ref 31.5–35.7)
MCV: 83 fL (ref 79–97)
Monocytes Absolute: 1.1 10*3/uL — ABNORMAL HIGH (ref 0.1–0.9)
Monocytes: 6 %
NRBC: 2 % — ABNORMAL HIGH (ref 0–0)
Neutrophils Absolute: 10.7 10*3/uL — ABNORMAL HIGH (ref 1.4–7.0)
Neutrophils: 55 %
Platelets: 412 10*3/uL (ref 150–450)
RBC: 4.94 x10E6/uL (ref 4.14–5.80)
RDW: 15.9 % — ABNORMAL HIGH (ref 11.6–15.4)
WBC: 19.2 10*3/uL — ABNORMAL HIGH (ref 3.4–10.8)

## 2018-02-11 LAB — FRUCTOSAMINE: Fructosamine: 398 umol/L — ABNORMAL HIGH (ref 0–285)

## 2018-02-12 ENCOUNTER — Telehealth: Payer: Self-pay

## 2018-02-12 NOTE — Telephone Encounter (Signed)
Spoke with patient, advised that xray showed chronic changes and that we would like for him to see ortho for further evaluation and treatment. We will send in new referral for him since he hasn't see anyone in a while. Advised that labs were within normal limits and no medication changes at this time. Thanks!

## 2018-02-12 NOTE — Telephone Encounter (Signed)
-----   Message from Lanae Boast, [place] sent at 02/11/2018  5:58 PM EST ----- The xray showed chronic changes due to sickle cell. I would like for him to go to ortho for further evaluation and treatment. If he needs a referral let me know .

## 2018-03-01 ENCOUNTER — Other Ambulatory Visit: Payer: Self-pay

## 2018-03-01 DIAGNOSIS — E119 Type 2 diabetes mellitus without complications: Secondary | ICD-10-CM

## 2018-03-01 MED ORDER — METFORMIN HCL 1000 MG PO TABS: 1000.0000 mg | ORAL_TABLET | Freq: Two times a day (BID) | ORAL | 0 refills | Status: DC

## 2018-04-07 ENCOUNTER — Other Ambulatory Visit: Payer: Self-pay

## 2018-04-07 ENCOUNTER — Ambulatory Visit (INDEPENDENT_AMBULATORY_CARE_PROVIDER_SITE_OTHER): Payer: Self-pay | Admitting: Family Medicine

## 2018-04-07 ENCOUNTER — Encounter: Payer: Self-pay | Admitting: Family Medicine

## 2018-04-07 VITALS — BP 134/90 | HR 88 | Temp 98.6°F | Resp 18 | Ht 66.0 in | Wt 174.0 lb

## 2018-04-07 DIAGNOSIS — D5700 Hb-SS disease with crisis, unspecified: Secondary | ICD-10-CM

## 2018-04-07 DIAGNOSIS — G8929 Other chronic pain: Secondary | ICD-10-CM

## 2018-04-07 DIAGNOSIS — M8700 Idiopathic aseptic necrosis of unspecified bone: Secondary | ICD-10-CM

## 2018-04-07 DIAGNOSIS — M7918 Myalgia, other site: Secondary | ICD-10-CM

## 2018-04-07 DIAGNOSIS — E119 Type 2 diabetes mellitus without complications: Secondary | ICD-10-CM

## 2018-04-07 DIAGNOSIS — D571 Sickle-cell disease without crisis: Secondary | ICD-10-CM

## 2018-04-07 LAB — POCT URINALYSIS DIPSTICK
Bilirubin, UA: NEGATIVE
Blood, UA: NEGATIVE
Glucose, UA: NEGATIVE
Ketones, UA: NEGATIVE
Leukocytes, UA: NEGATIVE
Nitrite, UA: NEGATIVE
Protein, UA: NEGATIVE
Spec Grav, UA: 1.015 (ref 1.010–1.025)
Urobilinogen, UA: 1 E.U./dL
pH, UA: 7 (ref 5.0–8.0)

## 2018-04-07 LAB — POCT GLYCOSYLATED HEMOGLOBIN (HGB A1C): Hemoglobin A1C: 5.2 % (ref 4.0–5.6)

## 2018-04-07 LAB — GLUCOSE, POCT (MANUAL RESULT ENTRY): POC Glucose: 152 mg/dl — AB (ref 70–99)

## 2018-04-07 MED ORDER — GLIPIZIDE 10 MG PO TABS: 10.0000 mg | ORAL_TABLET | Freq: Every day | ORAL | 5 refills | Status: DC

## 2018-04-07 MED ORDER — FOLIC ACID 1 MG PO TABS: 1.0000 mg | ORAL_TABLET | Freq: Every day | ORAL | 11 refills | Status: DC

## 2018-04-07 MED ORDER — ONDANSETRON 8 MG PO TBDP: 8.0000 mg | ORAL_TABLET | Freq: Three times a day (TID) | ORAL | 0 refills | Status: DC | PRN

## 2018-04-07 MED ORDER — VITAMIN D (ERGOCALCIFEROL) 1.25 MG (50000 UNIT) PO CAPS: 50000.0000 [IU] | ORAL_CAPSULE | ORAL | 1 refills | Status: DC

## 2018-04-07 MED ORDER — METFORMIN HCL 1000 MG PO TABS: 1000.0000 mg | ORAL_TABLET | Freq: Two times a day (BID) | ORAL | 2 refills | Status: DC

## 2018-04-07 MED ORDER — IBUPROFEN 600 MG PO TABS: 600.0000 mg | ORAL_TABLET | Freq: Three times a day (TID) | ORAL | 2 refills | Status: DC | PRN

## 2018-04-07 MED ORDER — OXYCODONE HCL 20 MG PO TABS: ORAL_TABLET | ORAL | 0 refills | Status: DC

## 2018-04-07 NOTE — Progress Notes (Signed)
PATIENT CARE CENTER INTERNAL MEDICINE AND SICKLE CELL CARE  SICKLE CELL ANEMIA FOLLOW UP VISIT PROVIDER: Lanae Boast, FNP    Subjective:   Gregory Bryant  is a 43 y.o.  male who  has a past medical history of Diabetes mellitus without complication ([place]) and Sickle cell anemia ([place]). presents for a follow up for Sickle Cell Anemia. The patient has had 1 admissions in the past 6 months.  Pain regimen includes: Ibuprofen and oxycodone  Hydrea Therapy: No Medication compliance: Yes  Pain today is 0/10.  The patient reports adequate daily hydration.     Review of Systems  Constitutional: Negative.   HENT: Negative.   Eyes: Negative.   Respiratory: Negative.   Cardiovascular: Negative.   Gastrointestinal: Negative.   Genitourinary: Negative.   Musculoskeletal: Positive for joint pain (Bilateral shoulder pain).  Skin: Negative.   Neurological: Negative.   Psychiatric/Behavioral: Negative.     Objective:   Objective  BP 134/90 (BP Location: Left Arm, Patient Position: Sitting, Cuff Size: Normal)   Pulse 88   Temp 98.6 F (37 C) (Oral)   Resp 18   Ht 5\' 6"  (1.676 m)   Wt 174 lb (78.9 kg)   SpO2 96%   BMI 28.08 kg/m   Wt Readings from Last 3 Encounters:  04/07/18 174 lb (78.9 kg)  02/10/18 176 lb (79.8 kg)  01/11/18 169 lb (76.7 kg)     Physical Exam Vitals signs and nursing note reviewed.  Constitutional:      General: He is not in acute distress.    Appearance: He is well-developed.  HENT:     Head: Normocephalic and atraumatic.  Eyes:     Conjunctiva/sclera: Conjunctivae normal.     Pupils: Pupils are equal, round, and reactive to light.  Neck:     Musculoskeletal: Normal range of motion.  Cardiovascular:     Rate and Rhythm: Normal rate and regular rhythm.     Pulses: Normal pulses.     Heart sounds: Normal heart sounds. No murmur.  Pulmonary:     Effort: Pulmonary effort is normal. No respiratory distress.     Breath sounds: Normal breath  sounds.  Abdominal:     General: Bowel sounds are normal. There is no distension.     Palpations: Abdomen is soft.  Musculoskeletal: Normal range of motion.        General: Tenderness: bilateral shoulders.      Right shoulder: He exhibits tenderness.     Left shoulder: He exhibits tenderness.  Skin:    General: Skin is warm and dry.  Neurological:     Mental Status: He is alert and oriented to person, place, and time.  Psychiatric:        Mood and Affect: Mood normal.        Behavior: Behavior normal.        Thought Content: Thought content normal.        Judgment: Judgment normal.      Assessment/Plan:   Assessment   Encounter Diagnoses  Name Primary?  . Type 2 diabetes mellitus without complication, without long-term current use of insulin ([place])   . Hb-SS disease without crisis ([place]) Yes  . Avascular necrosis ([place])   . Sickle cell anemia with pain (HCC)   . Chronic musculoskeletal pain      Plan  1. Type 2 diabetes mellitus without complication, without long-term current use of insulin (HCC) No medication changes warranted at the present time.   - Urinalysis Dipstick  2. Sickle cell anemia with pain (HCC) Pending labs. Will adjust medications accordingly.    - Urinalysis Dipstick - Oxycodone HCl 20 MG TABS; TAKE 1 TABLET BY MOUTH 4 TIMES A DAY AS NEEDED FOR PAIN  Dispense: 60 tablet; Refill: 0 - ibuprofen (ADVIL,MOTRIN) 600 MG tablet; Take 1 tablet (600 mg total) by mouth every 8 (eight) hours as needed.  Dispense: 30 tablet; Refill: 2 - CBC with Differential - DG Shoulder Left; Future - DG Shoulder Right; Future  3. Chronic musculoskeletal pain Refilled medications. Will refer to ortho for further evaluation of shoulder pain and AVN - Oxycodone HCl 20 MG TABS; TAKE 1 TABLET BY MOUTH 4 TIMES A DAY AS NEEDED FOR PAIN  Dispense: 60 tablet; Refill: 0  4. Mild intermittent asthma without complication No medication changes warranted at the present time.    5.  Avascular necrosis ([place]) Referral placed to ortho for follow up   Return to care as scheduled and prn. Patient verbalized understanding and agreed with plan of care.   1. Sickle cell disease -We discussed the need for good hydration, monitoring of hydration status, avoidance of heat, cold, stress, and infection triggers. We discussed the risks and benefits of Hydrea, including bone marrow suppression, the possibility of GI upset, skin ulcers, hair thinning, and teratogenicity. The patient was reminded of the need to seek medical attention of any symptoms of bleeding, anemia, or infection. Continue folic acid 1 mg daily to prevent aplastic bone marrow crises.   2. Pulmonary evaluation - Patient denies severe recurrent wheezes, shortness of breath with exercise, or persistent cough. If these symptoms develop, pulmonary function tests with spirometry will be ordered, and if abnormal, plan on referral to Pulmonology for further evaluation.  3. Cardiac - Routine screening for pulmonary hypertension is not recommended.  4. Eye - High risk of proliferative retinopathy. Annual eye exam with retinal exam recommended to patient.  5. Immunization status -  Yearly influenza vaccination is recommended, as well as being up to date with Meningococcal and Pneumococcal vaccines.   6. Acute and chronic painful episodes - We discussed that pt is to receive Schedule II prescriptions only from [place]. Pt is also aware that the prescription history is available to [place] online through the [facility] CSRS. Controlled substance agreement signed. We reminded Gregory Bryant that all patients receiving Schedule II narcotics must be seen for follow within one month of prescription being requested. We reviewed the terms of our pain agreement, including the need to keep medicines in a safe locked location away from children or pets, and the need to report excess sedation or constipation, measures to avoid constipation, and policies related  to early refills and stolen prescriptions. According to the [place] Chronic Pain Initiative program, we have reviewed details related to analgesia, adverse effects, aberrant behaviors.  7. Iron overload from chronic transfusion.  Not applicable at this time.  If this occurs will use Exjade for management.   8. Vitamin D deficiency - Drisdol 50,000 units weekly. Patient encouraged to take as prescribed.   The above recommendations are taken from the NIH Evidence-Based Management of Sickle Cell Disease: Expert Panel Report, 20149.   Ms. Andr L. Nathaneil Canary, FNP-BC [facility] [address] [place], [place] [address] [phone]  This note has been created with Dragon speech recognition software and smart phrase technology. Any transcriptional errors are unintentional.

## 2018-04-08 LAB — COMPREHENSIVE METABOLIC PANEL
ALT: 20 IU/L (ref 0–44)
AST: 24 IU/L (ref 0–40)
Albumin/Globulin Ratio: 1.3 (ref 1.2–2.2)
Albumin: 4.4 g/dL (ref 4.0–5.0)
Alkaline Phosphatase: 103 IU/L (ref 39–117)
BUN/Creatinine Ratio: 9 (ref 9–20)
BUN: 8 mg/dL (ref 6–24)
Bilirubin Total: 3 mg/dL — ABNORMAL HIGH (ref 0.0–1.2)
CO2: 20 mmol/L (ref 20–29)
Calcium: 9.7 mg/dL (ref 8.7–10.2)
Chloride: 102 mmol/L (ref 96–106)
Creatinine, Ser: 0.86 mg/dL (ref 0.76–1.27)
GFR calc Af Amer: 124 mL/min/{1.73_m2} (ref >59)
GFR calc non Af Amer: 107 mL/min/{1.73_m2} (ref >59)
Globulin, Total: 3.4 g/dL (ref 1.5–4.5)
Glucose: 178 mg/dL — ABNORMAL HIGH (ref 65–99)
Potassium: 4 mmol/L (ref 3.5–5.2)
Sodium: 137 mmol/L (ref 134–144)
Total Protein: 7.8 g/dL (ref 6.0–8.5)

## 2018-04-08 LAB — CBC WITH DIFFERENTIAL/PLATELET
Basophils Absolute: 0.2 10*3/uL (ref 0.0–0.2)
Basos: 1 %
EOS (ABSOLUTE): 1 10*3/uL — ABNORMAL HIGH (ref 0.0–0.4)
Eos: 6 %
Hematocrit: 39.2 % (ref 37.5–51.0)
Hemoglobin: 13.1 g/dL (ref 13.0–17.7)
Immature Grans (Abs): 0.2 10*3/uL — ABNORMAL HIGH (ref 0.0–0.1)
Immature Granulocytes: 1 %
Lymphocytes Absolute: 6 10*3/uL — ABNORMAL HIGH (ref 0.7–3.1)
Lymphs: 36 %
MCH: 29.2 pg (ref 26.6–33.0)
MCHC: 33.4 g/dL (ref 31.5–35.7)
MCV: 88 fL (ref 79–97)
Monocytes Absolute: 1.1 10*3/uL — ABNORMAL HIGH (ref 0.1–0.9)
Monocytes: 6 %
NRBC: 2 % — ABNORMAL HIGH (ref 0–0)
Neutrophils Absolute: 8.5 10*3/uL — ABNORMAL HIGH (ref 1.4–7.0)
Neutrophils: 50 %
Platelets: 394 10*3/uL (ref 150–450)
RBC: 4.48 x10E6/uL (ref 4.14–5.80)
RDW: 16.9 % — ABNORMAL HIGH (ref 11.6–15.4)
WBC: 16.9 10*3/uL — ABNORMAL HIGH (ref 3.4–10.8)

## 2018-04-08 LAB — FRUCTOSAMINE: Fructosamine: 393 umol/L — ABNORMAL HIGH (ref 0–285)

## 2018-04-09 ENCOUNTER — Telehealth: Payer: Self-pay

## 2018-04-09 ENCOUNTER — Other Ambulatory Visit: Payer: Self-pay | Admitting: Family Medicine

## 2018-04-09 MED ORDER — OXYCODONE HCL 15 MG PO TABS: 15.0000 mg | ORAL_TABLET | ORAL | 0 refills | Status: DC | PRN

## 2018-04-09 NOTE — Progress Notes (Signed)
Pharmacy called and requested a change.

## 2018-04-09 NOTE — Progress Notes (Signed)
Patient called. Notified of lab results.  No questions at this time.

## 2018-04-12 NOTE — Telephone Encounter (Signed)
Lanelle Bal will take care this.

## 2018-04-12 NOTE — Telephone Encounter (Signed)
Spoke with [facility] regarding medication not going through and they state that the diagnosis needs to be on the script. It also has to match the diagnosis that it put on the PA. I normally use the D57.1. Please resend the script with the diagnosis.

## 2018-04-13 MED ORDER — OXYCODONE HCL 15 MG PO TABS: 15.0000 mg | ORAL_TABLET | ORAL | 0 refills | Status: DC | PRN

## 2018-04-13 NOTE — Telephone Encounter (Signed)
I resent the script for this patient

## 2018-04-13 NOTE — Telephone Encounter (Signed)
Spoke with pharmacy and patient was able to get medication

## 2018-04-21 ENCOUNTER — Ambulatory Visit (INDEPENDENT_AMBULATORY_CARE_PROVIDER_SITE_OTHER): Payer: Medicaid Other | Admitting: Orthopedic Surgery

## 2018-05-06 ENCOUNTER — Ambulatory Visit (INDEPENDENT_AMBULATORY_CARE_PROVIDER_SITE_OTHER): Payer: Medicaid Other | Admitting: Orthopedic Surgery

## 2018-05-06 ENCOUNTER — Encounter (INDEPENDENT_AMBULATORY_CARE_PROVIDER_SITE_OTHER): Payer: Self-pay | Admitting: Orthopedic Surgery

## 2018-05-06 ENCOUNTER — Other Ambulatory Visit: Payer: Self-pay

## 2018-05-06 DIAGNOSIS — M19012 Primary osteoarthritis, left shoulder: Secondary | ICD-10-CM

## 2018-05-06 DIAGNOSIS — M19011 Primary osteoarthritis, right shoulder: Secondary | ICD-10-CM

## 2018-05-06 NOTE — Progress Notes (Signed)
Office Visit Note   Patient: Gregory Bryant           Date of Birth: [birth year]           MRN: [id number] Visit Date: 05/06/2018 Requested by: Lanae Boast, [place], [place] [address] PCP: Lanae Boast, FNP  Subjective: Chief Complaint  Patient presents with  . Right Shoulder - Pain  . Left Shoulder - Pain    HPI: Kaydenn is a patient with bilateral shoulder pain.  He has sickle cell.  Describes that the pain generally is constant but does not occur every day.  Denies any history of injury to the shoulders.  He does take oxycodone on a regular basis.  Denies any hip pain.  He is currently not working.              ROS: All systems reviewed are negative as they relate to the chief complaint within the history of present illness.  Patient denies  fevers or chills.   Assessment & Plan: Visit Diagnoses:  1. Bilateral shoulder region arthritis     Plan: Impression is bilateral shoulder avascular necrosis with fairly smooth range of motion today passively and good strength actively.  Radiographs show maintained sphericity of the humeral heads.  Plan at this time is observation and activity modification.  I will see him back should he develop more mechanical type symptoms.  Follow-Up Instructions: Return if symptoms worsen or fail to improve.   Orders:  No orders of the defined types were placed in this encounter.  No orders of the defined types were placed in this encounter.     Procedures: No procedures performed   Clinical Data: No additional findings.  Objective: Vital Signs: There were no vitals taken for this visit.  Physical Exam:   Constitutional: Patient appears well-developed HEENT:  Head: Normocephalic Eyes:EOM are normal Neck: Normal range of motion Cardiovascular: Normal rate Pulmonary/chest: Effort normal Neurologic: Patient is alert Skin: Skin is warm Psychiatric: Patient has normal mood and affect    Ortho Exam:  Ortho exam demonstrates good cervical spine range of motion.  Patient has 5 out of 5 grip EPL FPL interosseous wrist flexion extension bicep triceps and deltoid strength.  Radial pulse intact bilaterally.  Patient has no coarse grinding or crepitus with passive or active range of motion of the shoulder both at 15 degrees of abduction and 90 degrees of internal and external rotation.  Specialty Comments:  No specialty comments available.  Imaging: No results found.   PMFS History: Patient Active Problem List   Diagnosis Date Noted  . Sickle cell pain crisis ([employer]) 01/11/2018  . Sickle cell anemia with pain ([place]) 07/22/2017  . Chronic musculoskeletal pain 03/30/2017  . Avascular necrosis ([place]) 03/30/2017  . Asthma, mild intermittent 03/30/2017  . Diabetes ([place]) 02/07/2017  . 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 Diabetes mellitus without complication ([place])   . Sickle cell anemia (HCC)     Family History  Problem Relation Age of Onset  . Diabetes Mother     Past Surgical History:  Procedure Laterality Date  . CHOLECYSTECTOMY     Social History   Occupational History  . Not on file  Tobacco Use  . Smoking status: Never Smoker  . Smokeless tobacco: Never Used  Substance and Sexual Activity  . Alcohol use: No  . Drug use: No  . Sexual activity: Not on file

## 2018-05-20 ENCOUNTER — Telehealth: Payer: Self-pay

## 2018-05-21 MED ORDER — OXYCODONE HCL 15 MG PO TABS: 15.0000 mg | ORAL_TABLET | ORAL | 0 refills | Status: DC | PRN

## 2018-05-21 NOTE — Telephone Encounter (Signed)
Reviewed [place] Substance Reporting system prior to prescribing opiate medications. No inconsistencies noted.   

## 2018-07-07 ENCOUNTER — Telehealth: Payer: Self-pay

## 2018-07-07 NOTE — Telephone Encounter (Signed)
Called to do COVID Screening for appointment tomorrow. No answer. Left a message to call back. Thanks! 

## 2018-07-08 ENCOUNTER — Other Ambulatory Visit: Payer: Self-pay

## 2018-07-08 ENCOUNTER — Ambulatory Visit (INDEPENDENT_AMBULATORY_CARE_PROVIDER_SITE_OTHER): Payer: Medicaid Other | Admitting: Family Medicine

## 2018-07-08 VITALS — BP 139/85 | HR 80 | Temp 98.8°F | Resp 16 | Ht 66.0 in | Wt 172.0 lb

## 2018-07-08 DIAGNOSIS — E119 Type 2 diabetes mellitus without complications: Secondary | ICD-10-CM

## 2018-07-08 DIAGNOSIS — D5700 Hb-SS disease with crisis, unspecified: Secondary | ICD-10-CM

## 2018-07-08 DIAGNOSIS — J301 Allergic rhinitis due to pollen: Secondary | ICD-10-CM

## 2018-07-08 LAB — POCT URINALYSIS DIPSTICK
Bilirubin, UA: NEGATIVE
Blood, UA: NEGATIVE
Glucose, UA: NEGATIVE
Ketones, UA: NEGATIVE
Leukocytes, UA: NEGATIVE
Nitrite, UA: NEGATIVE
Protein, UA: NEGATIVE
Spec Grav, UA: 1.015 (ref 1.010–1.025)
Urobilinogen, UA: 2 E.U./dL — AB
pH, UA: 6 (ref 5.0–8.0)

## 2018-07-08 MED ORDER — FLUTICASONE PROPIONATE 50 MCG/ACT NA SUSP: 2.0000 | Freq: Every day | NASAL | 6 refills | Status: DC

## 2018-07-08 MED ORDER — CETIRIZINE HCL 10 MG PO TABS: 10.0000 mg | ORAL_TABLET | Freq: Every day | ORAL | 11 refills | Status: DC

## 2018-07-08 MED ORDER — METFORMIN HCL 1000 MG PO TABS: 1000.0000 mg | ORAL_TABLET | Freq: Two times a day (BID) | ORAL | 2 refills | Status: DC

## 2018-07-08 MED ORDER — VITAMIN D (ERGOCALCIFEROL) 1.25 MG (50000 UNIT) PO CAPS: 50000.0000 [IU] | ORAL_CAPSULE | ORAL | 1 refills | Status: DC

## 2018-07-08 MED ORDER — OXYCODONE HCL 15 MG PO TABS: 15.0000 mg | ORAL_TABLET | ORAL | 0 refills | Status: DC | PRN

## 2018-07-08 NOTE — Progress Notes (Signed)
[facility] INTERNAL MEDICINE AND SICKLE CELL CARE  SICKLE CELL ANEMIA FOLLOW UP VISIT PROVIDER: Lanae Boast, FNP    Subjective:   Gregory Bryant  is a 43 y.o.  male who  has a past medical history of Diabetes mellitus without complication ([place]) and Sickle cell anemia ([place]). presents for a follow up for Sickle Cell Anemia. The patient has had 1 hospital visit/admission in the past 6 months.  Pain regimen includes: Ibuprofen and oxycodone 15mg  IR Hydrea Therapy: No Medication compliance: Yes  Pain today is 0/10 The patient reports adequate daily hydration.   He report having intermittent nausea with metformin. He states that it does not happen often, but when it does he does have nausea with vomiting. He also has seasonal allergies and is not currently taking any medications. He is having nasal congestion and runny nose.   Review of Systems  Constitutional: Negative.   HENT: Positive for congestion. Negative for sinus pain and sore throat.   Eyes: Negative.   Respiratory: Negative.   Cardiovascular: Negative.   Gastrointestinal: Negative.   Genitourinary: Negative.   Musculoskeletal: Negative.   Skin: Negative.   Neurological: Negative.   Endo/Heme/Allergies: Positive for environmental allergies.  Psychiatric/Behavioral: Negative.     Objective:   Objective  BP 139/85 (BP Location: Right Arm, Patient Position: Sitting, Cuff Size: Normal)   Pulse 80   Temp 98.8 F (37.1 C) (Oral)   Resp 16   Ht 5\' 6"  (1.676 m)   Wt 172 lb (78 kg)   SpO2 100%   BMI 27.76 kg/m   Wt Readings from Last 3 Encounters:  07/08/18 172 lb (78 kg)  04/07/18 174 lb (78.9 kg)  02/10/18 176 lb (79.8 kg)     Physical Exam Vitals signs and nursing note reviewed.  Constitutional:      General: He is not in acute distress.    Appearance: Normal appearance.  HENT:     Head: Normocephalic and atraumatic.  Eyes:     Extraocular Movements: Extraocular movements intact.   Conjunctiva/sclera: Conjunctivae normal.     Pupils: Pupils are equal, round, and reactive to light.  Cardiovascular:     Rate and Rhythm: Normal rate and regular rhythm.     Heart sounds: No murmur.  Pulmonary:     Effort: Pulmonary effort is normal.     Breath sounds: Normal breath sounds.  Musculoskeletal: Normal range of motion.  Skin:    General: Skin is warm and dry.  Neurological:     Mental Status: He is alert and oriented to person, place, and time.  Psychiatric:        Mood and Affect: Mood normal.        Behavior: Behavior normal.        Thought Content: Thought content normal.        Judgment: Judgment normal.      Assessment/Plan:   Assessment   Encounter Diagnoses  Name Primary?  . Type 2 diabetes mellitus without complication, without long-term current use of insulin ([place]) Yes  . Sickle cell anemia with pain ([facility])   . Seasonal allergic rhinitis due to pollen      Plan  1. Type 2 diabetes mellitus without complication, without long-term current use of insulin ([facility]) - Urinalysis Dipstick - metFORMIN (GLUCOPHAGE) 1000 MG tablet; Take 1 tablet (1,000 mg total) by mouth 2 (two) times daily with a meal.  Dispense: 180 tablet; Refill: 2 - CBC with Differential - Fructosamine - Comprehensive metabolic panel  2. Sickle cell anemia with pain ([facility]) - Vitamin D, Ergocalciferol, (DRISDOL) 1.25 MG (50000 UT) CAPS capsule; Take 1 capsule (50,000 Units total) by mouth every 7 (seven) days.  Dispense: 30 capsule; Refill: 1  3. Seasonal allergic rhinitis due to pollen Sinus lavage recommended - fluticasone (FLONASE) 50 MCG/ACT nasal spray; Place 2 sprays into both nostrils daily.  Dispense: 16 g; Refill: 6    Return to care as scheduled and prn. Patient verbalized understanding and agreed with plan of care.   1. Sickle cell disease  We discussed the need for good hydration, monitoring of hydration status, avoidance of heat, cold, stress, and infection triggers. We  discussed the risks and benefits of Hydrea, including bone marrow suppression, the possibility of GI upset, skin ulcers, hair thinning, and teratogenicity. The patient was reminded of the need to seek medical attention of any symptoms of bleeding, anemia, or infection. Continue folic acid 1 mg daily to prevent aplastic bone marrow crises.   2. Pulmonary evaluation - Patient denies severe recurrent wheezes, shortness of breath with exercise, or persistent cough. If these symptoms develop, pulmonary function tests with spirometry will be ordered, and if abnormal, plan on referral to Pulmonology for further evaluation.  3. Cardiac - Routine screening for pulmonary hypertension is not recommended.  4. Eye - High risk of proliferative retinopathy. Annual eye exam with retinal exam recommended to patient.  5. Immunization status -  Yearly influenza vaccination is recommended, as well as being up to date with Meningococcal and Pneumococcal vaccines.   6. Acute and chronic painful episodes - We discussed that pt is to receive Schedule II prescriptions only from [place]. Pt is also aware that the prescription history is available to [place] online through the [facility] CSRS. Controlled substance agreement signed. We reminded Gregory Bryant that all patients receiving Schedule II narcotics must be seen for follow within one month of prescription being requested. We reviewed the terms of our pain agreement, including the need to keep medicines in a safe locked location away from children or pets, and the need to report excess sedation or constipation, measures to avoid constipation, and policies related to early refills and stolen prescriptions. According to the [place] [facility] program, we have reviewed details related to analgesia, adverse effects, aberrant behaviors.  7. Iron overload from chronic transfusion.  Not applicable at this time.  If this occurs will use Exjade for management.   8. Vitamin D deficiency  - Drisdol 50,000 units weekly. Patient encouraged to take as prescribed.   The above recommendations are taken from the NIH Evidence-Based Management of Sickle Cell Disease: Expert Panel Report, 20149.   Ms. Gregory L. Nathaneil Canary, FNP-BC Patient [facility] [address] [place], [place] [address] [phone]  This note has been created with Dragon speech recognition software and smart phrase technology. Any transcriptional errors are unintentional.

## 2018-07-08 NOTE — Patient Instructions (Addendum)
Sickle Cell Anemia, Adult Sickle cell anemia is a condition where your red blood cells are shaped like sickles. Red blood cells carry oxygen through the body. Sickle-shaped cells do not live as long as normal red blood cells. They also clump together and block blood from flowing through the blood vessels. This prevents the body from getting enough oxygen. Sickle cell anemia causes organ damage and pain. It also increases the risk of infection. Follow these instructions at home: Medicines  Take over-the-counter and prescription medicines only as told by your doctor.  If you were prescribed an antibiotic medicine, take it as told by your doctor. Do not stop taking the antibiotic even if you start to feel better.  If you develop a fever, do not take medicines to lower the fever right away. Tell your doctor about the fever. Managing pain, stiffness, and swelling  Try these methods to help with pain: ? Use a heating pad. ? Take a warm bath. ? Distract yourself, such as by watching TV. Eating and drinking  Drink enough fluid to keep your pee (urine) clear or pale yellow. Drink more in hot weather and during exercise.  Limit or avoid alcohol.  Eat a healthy diet. Eat plenty of fruits, vegetables, whole grains, and lean protein.  Take vitamins and supplements as told by your doctor. Traveling  When traveling, keep these with you: ? Your medical information. ? The names of your doctors. ? Your medicines.  If you need to take an airplane, talk to your doctor first. Activity  Rest often.  Avoid exercises that make your heart beat much faster, such as jogging. General instructions  Do not use products that have nicotine or tobacco, such as cigarettes and e-cigarettes. If you need help quitting, ask your doctor.  Consider wearing a medical alert bracelet.  Avoid being in high places (high altitudes), such as mountains.  Avoid very hot or cold temperatures.  Avoid places where the  temperature changes a lot.  Keep all follow-up visits as told by your doctor. This is important. Contact a doctor if:  A joint hurts.  Your feet or hands hurt or swell.  You feel tired (fatigued). Get help right away if:  You have symptoms of infection. These include: ? Fever. ? Chills. ? Being very tired. ? Irritability. ? Poor eating. ? Throwing up (vomiting).  You feel dizzy or faint.  You have new stomach pain, especially on the left side.  You have a an erection (priapism) that lasts more than 4 hours.  You have numbness in your arms or legs.  You have a hard time moving your arms or legs.  You have trouble talking.  You have pain that does not go away when you take medicine.  You are short of breath.  You are breathing fast.  You have a long-term cough.  You have pain in your chest.  You have a bad headache.  You have a stiff neck.  Your stomach looks bloated even though you did not eat much.  Your skin is pale.  You suddenly cannot see well. Summary  Sickle cell anemia is a condition where your red blood cells are shaped like sickles.  Follow your doctor's advice on ways to manage pain, food to eat, activities to do, and steps to take for safe travel.  Get medical help right away if you have any signs of infection, such as a fever. This information is not intended to replace advice given to you by your  health care provider. Make sure you discuss any questions you have with your health care provider. Document Released: 11/03/2012 Document Revised: 02/19/2016 Document Reviewed: 02/19/2016 Elsevier Interactive Patient Education  2019 [employer]. Diabetes Mellitus and Standards of Medical Care Managing diabetes (diabetes mellitus) can be complicated. Your diabetes treatment may be managed by a team of health care providers, including:  A physician who specializes in diabetes (endocrinologist).  A nurse practitioner or physician assistant.   Nurses.  A diet and nutrition specialist (registered dietitian).  A certified diabetes educator (CDE).  An exercise specialist.  A pharmacist.  An eye doctor.  A foot specialist (podiatrist).  A dentist.  A primary care provider.  A mental health provider. Your health care providers follow guidelines to help you get the best quality of care. The following schedule is a general guideline for your diabetes management plan. Your health care providers may give you more specific instructions. Physical exams Upon being diagnosed with diabetes mellitus, and each year after that, your health care provider will ask about your medical and family history. He or she will also do a physical exam. Your exam may include:  Measuring your height, weight, and body mass index (BMI).  Checking your blood pressure. This will be done at every routine medical visit. Your target blood pressure may vary depending on your medical conditions, your age, and other factors.  Thyroid gland exam.  Skin exam.  Screening for damage to your nerves (peripheral neuropathy). This may include checking the pulse in your legs and feet and checking the level of sensation in your hands and feet.  A complete foot exam to inspect the structure and skin of your feet, including checking for cuts, bruises, redness, blisters, sores, or other problems.  Screening for blood vessel (vascular) problems, which may include checking the pulse in your legs and feet and checking your temperature. Blood tests Depending on your treatment plan and your personal needs, you may have the following tests done:  HbA1c (hemoglobin A1c). This test provides information about blood sugar (glucose) control over the previous 2-3 months. It is used to adjust your treatment plan, if needed. This test will be done: ? At least 2 times a year, if you are meeting your treatment goals. ? 4 times a year, if you are not meeting your treatment goals or if  treatment goals have changed.  Lipid testing, including total, LDL, and HDL cholesterol and triglyceride levels. ? The goal for LDL is less than 100 mg/dL (5.5 mmol/L). If you are at high risk for complications, the goal is less than 70 mg/dL (3.9 mmol/L). ? The goal for HDL is 40 mg/dL (2.2 mmol/L) or higher for men and 50 mg/dL (2.8 mmol/L) or higher for women. An HDL cholesterol of 60 mg/dL (3.3 mmol/L) or higher gives some protection against heart disease. ? The goal for triglycerides is less than 150 mg/dL (8.3 mmol/L).  Liver function tests.  Kidney function tests.  Thyroid function tests. Dental and eye exams  Visit your dentist two times a year.  If you have type 1 diabetes, your health care provider may recommend an eye exam 3-5 years after you are diagnosed, and then once a year after your first exam. ? For children with type 1 diabetes, a health care provider may recommend an eye exam when your child is age 53 or older and has had diabetes for 3-5 years. After the first exam, your child should get an eye exam once a year.  If you have type 2 diabetes, your health care provider may recommend an eye exam as soon as you are diagnosed, and then once a year after your first exam. Immunizations   The yearly flu (influenza) vaccine is recommended for everyone 6 months or older who has diabetes.  The pneumonia (pneumococcal) vaccine is recommended for everyone 2 years or older who has diabetes. If you are 84 or older, you may get the pneumonia vaccine as a series of two separate shots.  The hepatitis B vaccine is recommended for adults shortly after being diagnosed with diabetes.  Adults and children with diabetes should receive all other vaccines according to age-specific recommendations from the Centers for Disease Control and Prevention (CDC). Mental and emotional health Screening for symptoms of eating disorders, anxiety, and depression is recommended at the time of diagnosis  and afterward as needed. If your screening shows that you have symptoms (positive screening result), you may need more evaluation and you may work with a mental health care provider. Treatment plan Your treatment plan will be reviewed at every medical visit. You and your health care provider will discuss:  How you are taking your medicines, including insulin.  Any side effects you are experiencing.  Your blood glucose target goals.  The frequency of your blood glucose monitoring.  Lifestyle habits, such as activity level as well as tobacco, alcohol, and substance use. Diabetes self-management education Your health care provider will assess how well you are monitoring your blood glucose levels and whether you are taking your insulin correctly. He or she may refer you to:  A certified diabetes educator to manage your diabetes throughout your life, starting at diagnosis.  A registered dietitian who can create or review your personal nutrition plan.  An exercise specialist who can discuss your activity level and exercise plan. Summary  Managing diabetes (diabetes mellitus) can be complicated. Your diabetes treatment may be managed by a team of health care providers.  Your health care providers follow guidelines in order to help you get the best quality of care.  Standards of care including having regular physical exams, blood tests, blood pressure monitoring, immunizations, screening tests, and education about how to manage your diabetes.  Your health care providers may also give you more specific instructions based on your individual health. This information is not intended to replace advice given to you by your health care provider. Make sure you discuss any questions you have with your health care provider. Document Released: 11/10/2008 Document Revised: 10/02/2017 Document Reviewed: 10/12/2015 Elsevier Interactive Patient Education  2019 Elsevier Inc.  Allergic Rhinitis, Adult  Allergic rhinitis is a reaction to allergens in the air. Allergens are tiny specks (particles) in the air that cause your body to have an allergic reaction. This condition cannot be passed from person to person (is not contagious). Allergic rhinitis cannot be cured, but it can be controlled. There are two types of allergic rhinitis:  Seasonal. This type is also called hay fever. It happens only during certain times of the year.  Perennial. This type can happen at any time of the year. What are the causes? This condition may be caused by:  Pollen from grasses, trees, and weeds.  House dust mites.  Pet dander.  Mold. What are the signs or symptoms? Symptoms of this condition include:  Sneezing.  Runny or stuffy nose (nasal congestion).  A lot of mucus in the back of the throat (postnasal drip).  Itchy nose.  Tearing of the eyes.  Trouble sleeping.  Being sleepy during day. How is this treated? There is no cure for this condition. You should avoid things that trigger your symptoms (allergens). Treatment can help to relieve symptoms. This may include:  Medicines that block allergy symptoms, such as antihistamines. These may be given as a shot, nasal spray, or pill.  Shots that are given until your body becomes less sensitive to the allergen (desensitization).  Stronger medicines, if all other treatments have not worked. Follow these instructions at home: Avoiding allergens   Find out what you are allergic to. Common allergens include smoke, dust, and pollen.  Avoid them if you can. These are some of the things that you can do to avoid allergens: ? Replace carpet with wood, tile, or vinyl flooring. Carpet can trap dander and dust. ? Clean any mold found in the home. ? Do not smoke. Do not allow smoking in your home. ? Change your heating and air conditioning filter at least once a month. ? During allergy season:  Keep windows closed as much as you can. If possible,  use air conditioning when there is a lot of pollen in the air.  Use a special filter for allergies with your furnace and air conditioner.  Plan outdoor activities when pollen counts are lowest. This is usually during the early morning or evening hours.  If you do go outdoors when pollen count is high, wear a special mask for people with allergies.  When you come indoors, take a shower and change your clothes before sitting on furniture or bedding. General instructions  Do not use fans in your home.  Do not hang clothes outside to dry.  Wear sunglasses to keep pollen out of your eyes.  Wash your hands right away after you touch household pets.  Take over-the-counter and prescription medicines only as told by your doctor.  Keep all follow-up visits as told by your doctor. This is important. Contact a doctor if:  You have a fever.  You have a cough that does not go away (is persistent).  You start to make whistling sounds when you breathe (wheeze).  Your symptoms do not get better with treatment.  You have thick fluid coming from your nose.  You start to have nosebleeds. Get help right away if:  Your tongue or your lips are swollen.  You have trouble breathing.  You feel dizzy or you feel like you are going to pass out (faint).  You have cold sweats. Summary  Allergic rhinitis is a reaction to allergens in the air.  This condition may be caused by allergens. These include pollen, dust mites, pet dander, and mold.  Symptoms include a runny, itchy nose, sneezing, or tearing eyes. You may also have trouble sleeping or feel sleepy during the day.  Treatment includes taking medicines and avoiding allergens. You may also get shots or take stronger medicines.  Get help if you have a fever or a cough that does not stop. Get help right away if you are short of breath. This information is not intended to replace advice given to you by your health care provider. Make sure you  discuss any questions you have with your health care provider. Document Released: 05/15/2010 Document Revised: 08/04/2017 Document Reviewed: 08/04/2017 Elsevier Interactive Patient Education  2019 [employer].

## 2018-07-09 LAB — CBC WITH DIFFERENTIAL/PLATELET
Basophils Absolute: 0.5 10*3/uL — ABNORMAL HIGH (ref 0.0–0.2)
Basos: 3 %
EOS (ABSOLUTE): 1 10*3/uL — ABNORMAL HIGH (ref 0.0–0.4)
Eos: 5 %
Hematocrit: 35.6 % — ABNORMAL LOW (ref 37.5–51.0)
Hemoglobin: 12.1 g/dL — ABNORMAL LOW (ref 13.0–17.7)
Immature Grans (Abs): 0.3 10*3/uL — ABNORMAL HIGH (ref 0.0–0.1)
Immature Granulocytes: 2 %
Lymphocytes Absolute: 6.1 10*3/uL — ABNORMAL HIGH (ref 0.7–3.1)
Lymphs: 33 %
MCH: 29.4 pg (ref 26.6–33.0)
MCHC: 34 g/dL (ref 31.5–35.7)
MCV: 86 fL (ref 79–97)
Monocytes Absolute: 1.1 10*3/uL — ABNORMAL HIGH (ref 0.1–0.9)
Monocytes: 6 %
NRBC: 3 % — ABNORMAL HIGH (ref 0–0)
Neutrophils Absolute: 9.7 10*3/uL — ABNORMAL HIGH (ref 1.4–7.0)
Neutrophils: 51 %
Platelets: 417 10*3/uL (ref 150–450)
RBC: 4.12 x10E6/uL — ABNORMAL LOW (ref 4.14–5.80)
RDW: 15.6 % — ABNORMAL HIGH (ref 11.6–15.4)
WBC: 18.7 10*3/uL — ABNORMAL HIGH (ref 3.4–10.8)

## 2018-07-09 LAB — COMPREHENSIVE METABOLIC PANEL
ALT: 39 IU/L (ref 0–44)
AST: 42 IU/L — ABNORMAL HIGH (ref 0–40)
Albumin/Globulin Ratio: 1.5 (ref 1.2–2.2)
Albumin: 4.3 g/dL (ref 4.0–5.0)
Alkaline Phosphatase: 100 IU/L (ref 39–117)
BUN/Creatinine Ratio: 10 (ref 9–20)
BUN: 9 mg/dL (ref 6–24)
Bilirubin Total: 4 mg/dL — ABNORMAL HIGH (ref 0.0–1.2)
CO2: 19 mmol/L — ABNORMAL LOW (ref 20–29)
Calcium: 9.9 mg/dL (ref 8.7–10.2)
Chloride: 103 mmol/L (ref 96–106)
Creatinine, Ser: 0.88 mg/dL (ref 0.76–1.27)
GFR calc Af Amer: 122 mL/min/{1.73_m2} (ref >59)
GFR calc non Af Amer: 105 mL/min/{1.73_m2} (ref >59)
Globulin, Total: 2.9 g/dL (ref 1.5–4.5)
Glucose: 113 mg/dL — ABNORMAL HIGH (ref 65–99)
Potassium: 4.1 mmol/L (ref 3.5–5.2)
Sodium: 138 mmol/L (ref 134–144)
Total Protein: 7.2 g/dL (ref 6.0–8.5)

## 2018-07-09 LAB — FRUCTOSAMINE: Fructosamine: 392 umol/L — ABNORMAL HIGH (ref 0–285)

## 2018-07-15 NOTE — Progress Notes (Signed)
Your labs are stable. Continue with your current medications. Please remember to keep your follow up appointment. If you have problems, questions or concerns, please make an appointment to discuss. Thanks!

## 2018-08-10 ENCOUNTER — Telehealth: Payer: Self-pay | Admitting: Orthopedic Surgery

## 2018-08-10 NOTE — Telephone Encounter (Signed)
Received a call from Jefferson at [employer] office checking status of request from patient. I informed her that was processed by [employer] and they released records to the patient

## 2018-09-01 ENCOUNTER — Telehealth: Payer: Self-pay

## 2018-09-01 DIAGNOSIS — D5700 Hb-SS disease with crisis, unspecified: Secondary | ICD-10-CM

## 2018-09-01 MED ORDER — OXYCODONE HCL 15 MG PO TABS: 15.0000 mg | ORAL_TABLET | ORAL | 0 refills | Status: DC | PRN

## 2018-09-01 NOTE — Telephone Encounter (Signed)
Refilled

## 2018-10-06 ENCOUNTER — Other Ambulatory Visit: Payer: Self-pay

## 2018-10-06 ENCOUNTER — Encounter (HOSPITAL_COMMUNITY): Payer: Self-pay | Admitting: *Deleted

## 2018-10-06 ENCOUNTER — Encounter (HOSPITAL_COMMUNITY): Payer: Self-pay

## 2018-10-06 DIAGNOSIS — E119 Type 2 diabetes mellitus without complications: Secondary | ICD-10-CM

## 2018-10-06 MED ORDER — GLIPIZIDE 10 MG PO TABS: 10.0000 mg | ORAL_TABLET | Freq: Every day | ORAL | 5 refills | Status: DC

## 2018-10-08 ENCOUNTER — Ambulatory Visit (INDEPENDENT_AMBULATORY_CARE_PROVIDER_SITE_OTHER): Payer: Medicaid Other | Admitting: Family Medicine

## 2018-10-08 ENCOUNTER — Encounter: Payer: Self-pay | Admitting: Family Medicine

## 2018-10-08 ENCOUNTER — Other Ambulatory Visit: Payer: Self-pay

## 2018-10-08 VITALS — BP 131/87 | HR 78 | Temp 98.6°F | Resp 16 | Ht 66.0 in | Wt 166.0 lb

## 2018-10-08 DIAGNOSIS — E119 Type 2 diabetes mellitus without complications: Secondary | ICD-10-CM | POA: Diagnosis not present

## 2018-10-08 DIAGNOSIS — Z23 Encounter for immunization: Secondary | ICD-10-CM

## 2018-10-08 DIAGNOSIS — D5700 Hb-SS disease with crisis, unspecified: Secondary | ICD-10-CM | POA: Diagnosis not present

## 2018-10-08 LAB — POCT URINALYSIS DIPSTICK
Bilirubin, UA: NEGATIVE
Blood, UA: NEGATIVE
Glucose, UA: NEGATIVE
Ketones, UA: NEGATIVE
Leukocytes, UA: NEGATIVE
Nitrite, UA: NEGATIVE
Protein, UA: NEGATIVE
Spec Grav, UA: 1.01 (ref 1.010–1.025)
Urobilinogen, UA: 0.2 E.U./dL
pH, UA: 5.5 (ref 5.0–8.0)

## 2018-10-08 MED ORDER — OXYCODONE HCL 15 MG PO TABS: 15.0000 mg | ORAL_TABLET | ORAL | 0 refills | Status: DC | PRN

## 2018-10-08 NOTE — Progress Notes (Signed)
PATIENT [facility]ARE [facility] INTERNAL MEDICINE AND SICKLE CELL CARE  SICKLE CELL ANEMIA FOLLOW UP VISIT PROVIDER: Lanae Boast, FNP    Subjective:   Gregory Bryant  is a 43 y.o.  male who  has a past medical history of Diabetes mellitus without complication ([place]) and Sickle cell anemia ([place]). presents for a follow up for Sickle Cell Anemia and diabetes. He had an eye exam on 09/30/2018 at [facility] that was negative for retinopathy.   Patient states that he stopped taking metformin due to feeling shaky. He is compliant with glipizide. Does not check blood glucose at home. He is sickle cell and will need a fructosamine instead of an A1C.   Review of Systems  Constitutional: Negative.   HENT: Negative.   Eyes: Negative.   Respiratory: Negative.   Cardiovascular: Negative.   Gastrointestinal: Negative.   Genitourinary: Negative.   Musculoskeletal: Negative.   Skin: Negative.   Neurological: Negative.   Psychiatric/Behavioral: Negative.     Objective:   Objective  BP 131/87 (BP Location: Left Arm, Patient Position: Sitting, Cuff Size: Large)   Pulse 78   Temp 98.6 F (37 C) (Oral)   Resp 16   Ht 5\' 6"  (1.676 m)   Wt 166 lb (75.3 kg)   SpO2 100%   BMI 26.79 kg/m   Wt Readings from Last 3 Encounters:  10/08/18 166 lb (75.3 kg)  07/08/18 172 lb (78 kg)  04/07/18 174 lb (78.9 kg)     Physical Exam Vitals signs and nursing note reviewed.  Constitutional:      General: He is not in acute distress.    Appearance: Normal appearance.  HENT:     Head: Normocephalic and atraumatic.  Eyes:     Extraocular Movements: Extraocular movements intact.     Conjunctiva/sclera: Conjunctivae normal.     Pupils: Pupils are equal, round, and reactive to light.  Cardiovascular:     Rate and Rhythm: Normal rate and regular rhythm.     Heart sounds: No murmur.  Pulmonary:     Effort: Pulmonary effort is normal.     Breath sounds: Normal breath sounds.  Musculoskeletal: Normal range of  motion.  Skin:    General: Skin is warm and dry.  Neurological:     Mental Status: He is alert and oriented to person, place, and time.  Psychiatric:        Mood and Affect: Mood normal.        Behavior: Behavior normal.        Thought Content: Thought content normal.        Judgment: Judgment normal.      Assessment/Plan:   Assessment   Encounter Diagnoses  Name Primary?  . Type 2 diabetes mellitus without complication, without long-term current use of insulin ([place]) Yes  . Sickle cell anemia with pain (HCC)      Plan  1. Type 2 diabetes mellitus without complication, without long-term current use of insulin (HCC) D/C metformin. Continue with glipizide.  - Urinalysis Dipstick - Microalbumin/Creatinine Ratio, Urine - Fructosamine - HM Diabetes Foot Exam  2. Sickle cell anemia with pain (HCC) - oxyCODONE (ROXICODONE) 15 MG immediate release tablet; Take 1 tablet (15 mg total) by mouth every 4 (four) hours as needed for up to 15 days for pain.  Dispense: 60 tablet; Refill: 0 - Comprehensive metabolic panel - CBC with Differential - Reticulocytes  Gave patient information on oxbryta. He will read information and consider it. This may help lower his  bilirubin levels.   Return to care as scheduled and prn. Patient verbalized understanding and agreed with plan of care.   1. Sickle cell disease -   We discussed the need for good hydration, monitoring of hydration status, avoidance of heat, cold, stress, and infection triggers. We discussed the risks and benefits of Hydrea, including bone marrow suppression, the possibility of GI upset, skin ulcers, hair thinning, and teratogenicity. The patient was reminded of the need to seek medical attention of any symptoms of bleeding, anemia, or infection. Continue folic acid 1 mg daily to prevent aplastic bone marrow crises.   2. Pulmonary evaluation - Patient denies severe recurrent wheezes, shortness of breath with exercise, or persistent  cough. If these symptoms develop, pulmonary function tests with spirometry will be ordered, and if abnormal, plan on referral to Pulmonology for further evaluation.  3. Cardiac - Routine screening for pulmonary hypertension is not recommended.  4. Eye - High risk of proliferative retinopathy. Annual eye exam with retinal exam recommended to patient.  5. Immunization status -  Yearly influenza vaccination is recommended, as well as being up to date with Meningococcal and Pneumococcal vaccines.   6. Acute and chronic painful episodes - We discussed that pt is to receive Schedule II prescriptions only from [place]. Pt is also aware that the prescription history is available to [place] online through the [facility] CSRS. Controlled substance agreement signed. We reminded Fleming Matel that all patients receiving Schedule II narcotics must be seen for follow within one month of prescription being requested. We reviewed the terms of our pain agreement, including the need to keep medicines in a safe locked location away from children or pets, and the need to report excess sedation or constipation, measures to avoid constipation, and policies related to early refills and stolen prescriptions. According to the [place] [facility] program, we have reviewed details related to analgesia, adverse effects, aberrant behaviors.  7. Iron overload from chronic transfusion.  Not applicable at this time.  If this occurs will use Exjade for management.   8. Vitamin D deficiency - Drisdol 50,000 units weekly. Patient encouraged to take as prescribed.   The above recommendations are taken from the NIH Evidence-Based Management of Sickle Cell Disease: Expert Panel Report, 20149.   Ms. Andr L. Nathaneil Canary, FNP-BC [facility] [address] [place], [place] [address] [phone]  This note has been created with Dragon speech recognition software and smart phrase technology. Any  transcriptional errors are unintentional.

## 2018-10-08 NOTE — Patient Instructions (Signed)
Sickle Cell Anemia, Adult ° °Sickle cell anemia is a condition where your red blood cells are shaped like sickles. Red blood cells carry oxygen through the body. Sickle-shaped cells do not live as long as normal red blood cells. They also clump together and block blood from flowing through the blood vessels. This prevents the body from getting enough oxygen. Sickle cell anemia causes organ damage and pain. It also increases the risk of infection. °Follow these instructions at home: °Medicines °· Take over-the-counter and prescription medicines only as told by your doctor. °· If you were prescribed an antibiotic medicine, take it as told by your doctor. Do not stop taking the antibiotic even if you start to feel better. °· If you develop a fever, do not take medicines to lower the fever right away. Tell your doctor about the fever. °Managing pain, stiffness, and swelling °· Try these methods to help with pain: °? Use a heating pad. °? Take a warm bath. °? Distract yourself, such as by watching TV. °Eating and drinking °· Drink enough fluid to keep your pee (urine) clear or pale yellow. Drink more in hot weather and during exercise. °· Limit or avoid alcohol. °· Eat a healthy diet. Eat plenty of fruits, vegetables, whole grains, and lean protein. °· Take vitamins and supplements as told by your doctor. °Traveling °· When traveling, keep these with you: °? Your medical information. °? The names of your doctors. °? Your medicines. °· If you need to take an airplane, talk to your doctor first. °Activity °· Rest often. °· Avoid exercises that make your heart beat much faster, such as jogging. °General instructions °· Do not use products that have nicotine or tobacco, such as cigarettes and e-cigarettes. If you need help quitting, ask your doctor. °· Consider wearing a medical alert bracelet. °· Avoid being in high places (high altitudes), such as mountains. °· Avoid very hot or cold temperatures. °· Avoid places where the  temperature changes a lot. °· Keep all follow-up visits as told by your doctor. This is important. °Contact a doctor if: °· A joint hurts. °· Your feet or hands hurt or swell. °· You feel tired (fatigued). °Get help right away if: °· You have symptoms of infection. These include: °? Fever. °? Chills. °? Being very tired. °? Irritability. °? Poor eating. °? Throwing up (vomiting). °· You feel dizzy or faint. °· You have new stomach pain, especially on the left side. °· You have a an erection (priapism) that lasts more than 4 hours. °· You have numbness in your arms or legs. °· You have a hard time moving your arms or legs. °· You have trouble talking. °· You have pain that does not go away when you take medicine. °· You are short of breath. °· You are breathing fast. °· You have a long-term cough. °· You have pain in your chest. °· You have a bad headache. °· You have a stiff neck. °· Your stomach looks bloated even though you did not eat much. °· Your skin is pale. °· You suddenly cannot see well. °Summary °· Sickle cell anemia is a condition where your red blood cells are shaped like sickles. °· Follow your doctor's advice on ways to manage pain, food to eat, activities to do, and steps to take for safe travel. °· Get medical help right away if you have any signs of infection, such as a fever. °This information is not intended to replace advice given to you by   your health care provider. Make sure you discuss any questions you have with your health care provider. °Document Released: 11/03/2012 Document Revised: 05/07/2018 Document Reviewed: 02/19/2016 °Elsevier Patient Education © 2020 Elsevier Inc. ° °

## 2018-10-09 LAB — COMPREHENSIVE METABOLIC PANEL
ALT: 32 IU/L (ref 0–44)
AST: 28 IU/L (ref 0–40)
Albumin/Globulin Ratio: 1.5 (ref 1.2–2.2)
Albumin: 4.5 g/dL (ref 4.0–5.0)
Alkaline Phosphatase: 109 IU/L (ref 39–117)
BUN/Creatinine Ratio: 11 (ref 9–20)
BUN: 9 mg/dL (ref 6–24)
Bilirubin Total: 3 mg/dL — ABNORMAL HIGH (ref 0.0–1.2)
CO2: 19 mmol/L — ABNORMAL LOW (ref 20–29)
Calcium: 10 mg/dL (ref 8.7–10.2)
Chloride: 107 mmol/L — ABNORMAL HIGH (ref 96–106)
Creatinine, Ser: 0.82 mg/dL (ref 0.76–1.27)
GFR calc Af Amer: 125 mL/min/{1.73_m2} (ref >59)
GFR calc non Af Amer: 108 mL/min/{1.73_m2} (ref >59)
Globulin, Total: 3 g/dL (ref 1.5–4.5)
Glucose: 95 mg/dL (ref 65–99)
Potassium: 4.3 mmol/L (ref 3.5–5.2)
Sodium: 142 mmol/L (ref 134–144)
Total Protein: 7.5 g/dL (ref 6.0–8.5)

## 2018-10-09 LAB — CBC WITH DIFFERENTIAL/PLATELET
Basophils Absolute: 0.2 10*3/uL (ref 0.0–0.2)
Basos: 1 %
EOS (ABSOLUTE): 0.6 10*3/uL — ABNORMAL HIGH (ref 0.0–0.4)
Eos: 3 %
Hematocrit: 35.8 % — ABNORMAL LOW (ref 37.5–51.0)
Hemoglobin: 12.2 g/dL — ABNORMAL LOW (ref 13.0–17.7)
Immature Grans (Abs): 0.3 10*3/uL — ABNORMAL HIGH (ref 0.0–0.1)
Immature Granulocytes: 2 %
Lymphocytes Absolute: 4.3 10*3/uL — ABNORMAL HIGH (ref 0.7–3.1)
Lymphs: 25 %
MCH: 28.4 pg (ref 26.6–33.0)
MCHC: 34.1 g/dL (ref 31.5–35.7)
MCV: 83 fL (ref 79–97)
Monocytes Absolute: 1.1 10*3/uL — ABNORMAL HIGH (ref 0.1–0.9)
Monocytes: 6 %
NRBC: 2 % — ABNORMAL HIGH (ref 0–0)
Neutrophils Absolute: 10.6 10*3/uL — ABNORMAL HIGH (ref 1.4–7.0)
Neutrophils: 63 %
Platelets: 440 10*3/uL (ref 150–450)
RBC: 4.3 x10E6/uL (ref 4.14–5.80)
RDW: 15.7 % — ABNORMAL HIGH (ref 11.6–15.4)
WBC: 17 10*3/uL — ABNORMAL HIGH (ref 3.4–10.8)

## 2018-10-09 LAB — RETICULOCYTES: Retic Ct Pct: 6.3 % — ABNORMAL HIGH (ref 0.6–2.6)

## 2018-10-09 LAB — MICROALBUMIN / CREATININE URINE RATIO
Creatinine, Urine: 82.9 mg/dL
Microalb/Creat Ratio: 9 mg/g creat (ref 0–29)
Microalbumin, Urine: 7.3 ug/mL

## 2018-10-09 LAB — FRUCTOSAMINE: Fructosamine: 401 umol/L — ABNORMAL HIGH (ref 0–285)

## 2018-10-11 ENCOUNTER — Telehealth: Payer: Self-pay

## 2018-10-11 NOTE — Telephone Encounter (Signed)
Called, no answer. Left a message to call back. Thanks!  

## 2018-10-11 NOTE — Telephone Encounter (Signed)
-----   Message from Lanae Boast, [place] sent at 10/11/2018  2:31 PM EDT ----- Please let him know that his glucose has increased. Please discuss carbohydrate modification through decreasing bread, pasta, soda, and sweets. Continue with current medications and we will continue to monitor his fructosamine levels.

## 2018-10-12 NOTE — Telephone Encounter (Signed)
Called, spoke with patient. Advised that glucose has increased and that he needs to work on diet and reducing carbohydrates such as bread, pasta, sodas and sweets. Advised to continue current medications and we will continue to monitor fructosamine levels at next appointment. Thanks!

## 2018-12-27 ENCOUNTER — Telehealth: Payer: Self-pay

## 2018-12-27 NOTE — Telephone Encounter (Signed)
Refill request for oxycodone. Please advise.  

## 2018-12-28 ENCOUNTER — Telehealth: Payer: Self-pay

## 2018-12-28 ENCOUNTER — Other Ambulatory Visit: Payer: Self-pay | Admitting: Internal Medicine

## 2018-12-28 DIAGNOSIS — D5700 Hb-SS disease with crisis, unspecified: Secondary | ICD-10-CM

## 2018-12-28 MED ORDER — OXYCODONE HCL 15 MG PO TABS: 15.0000 mg | ORAL_TABLET | ORAL | 0 refills | Status: DC | PRN

## 2018-12-28 NOTE — Telephone Encounter (Signed)
Patient called and Oxycodone was sent to wrong pharmacy yesterday. Insurance won't cover it unless it is sent to [place] [facility] in [place]. Can you please re-send this

## 2018-12-28 NOTE — Telephone Encounter (Signed)
Refilled

## 2018-12-29 ENCOUNTER — Other Ambulatory Visit: Payer: Self-pay | Admitting: Family Medicine

## 2018-12-29 DIAGNOSIS — D5700 Hb-SS disease with crisis, unspecified: Secondary | ICD-10-CM

## 2018-12-29 MED ORDER — OXYCODONE HCL 15 MG PO TABS: 15.0000 mg | ORAL_TABLET | ORAL | 0 refills | Status: DC | PRN

## 2018-12-29 NOTE — Telephone Encounter (Signed)
Called, no answer. Left a message that rx was sent to corrected pharmacy. Thanks!

## 2018-12-29 NOTE — Telephone Encounter (Signed)
This has been cancelled at [facility] in [facility];

## 2019-01-03 ENCOUNTER — Other Ambulatory Visit: Payer: Self-pay | Admitting: Family Medicine

## 2019-01-03 ENCOUNTER — Telehealth: Payer: Self-pay | Admitting: Family Medicine

## 2019-01-03 DIAGNOSIS — D5700 Hb-SS disease with crisis, unspecified: Secondary | ICD-10-CM

## 2019-01-03 NOTE — Telephone Encounter (Signed)
Called and made aware. Thanks!  

## 2019-01-07 ENCOUNTER — Ambulatory Visit: Payer: Medicaid Other | Admitting: Family Medicine

## 2019-04-04 ENCOUNTER — Telehealth: Payer: Self-pay | Admitting: Internal Medicine

## 2019-04-04 ENCOUNTER — Telehealth: Payer: Self-pay | Admitting: Family Medicine

## 2019-04-04 NOTE — Telephone Encounter (Signed)
Called pt. No answer. Voicemail box full. Unable to leave message

## 2019-04-04 NOTE — Telephone Encounter (Signed)
Pt was called and reminded of there appointment But call could not be completed at this time

## 2019-04-05 ENCOUNTER — Ambulatory Visit (INDEPENDENT_AMBULATORY_CARE_PROVIDER_SITE_OTHER): Payer: Medicaid Other | Admitting: Family Medicine

## 2019-04-05 ENCOUNTER — Encounter: Payer: Self-pay | Admitting: Family Medicine

## 2019-04-05 ENCOUNTER — Other Ambulatory Visit: Payer: Self-pay

## 2019-04-05 VITALS — BP 146/90 | HR 93 | Temp 98.7°F | Resp 16 | Ht 66.0 in | Wt 174.0 lb

## 2019-04-05 DIAGNOSIS — E119 Type 2 diabetes mellitus without complications: Secondary | ICD-10-CM

## 2019-04-05 DIAGNOSIS — E559 Vitamin D deficiency, unspecified: Secondary | ICD-10-CM

## 2019-04-05 DIAGNOSIS — D5700 Hb-SS disease with crisis, unspecified: Secondary | ICD-10-CM | POA: Diagnosis not present

## 2019-04-05 DIAGNOSIS — M8700 Idiopathic aseptic necrosis of unspecified bone: Secondary | ICD-10-CM

## 2019-04-05 LAB — POCT URINALYSIS DIPSTICK
Bilirubin, UA: NEGATIVE
Blood, UA: NEGATIVE
Glucose, UA: NEGATIVE
Ketones, UA: NEGATIVE
Leukocytes, UA: NEGATIVE
Nitrite, UA: NEGATIVE
Protein, UA: NEGATIVE
Spec Grav, UA: 1.015 (ref 1.010–1.025)
Urobilinogen, UA: 1 E.U./dL
pH, UA: 6 (ref 5.0–8.0)

## 2019-04-05 MED ORDER — VITAMIN D (ERGOCALCIFEROL) 1.25 MG (50000 UNIT) PO CAPS: 50000.0000 [IU] | ORAL_CAPSULE | ORAL | 1 refills | Status: DC

## 2019-04-05 MED ORDER — FOLIC ACID 1 MG PO TABS: 1.0000 mg | ORAL_TABLET | Freq: Every day | ORAL | 11 refills | Status: DC

## 2019-04-05 MED ORDER — METFORMIN HCL 1000 MG PO TABS: 1000.0000 mg | ORAL_TABLET | Freq: Two times a day (BID) | ORAL | 2 refills | Status: DC

## 2019-04-05 MED ORDER — GLIPIZIDE 10 MG PO TABS: 10.0000 mg | ORAL_TABLET | Freq: Every day | ORAL | 5 refills | Status: DC

## 2019-04-05 MED ORDER — OXYCODONE HCL 15 MG PO TABS: 15.0000 mg | ORAL_TABLET | ORAL | 0 refills | Status: DC | PRN

## 2019-04-05 NOTE — Patient Instructions (Signed)
[phone] Sickle cell day clinic  COVID-19 Vaccine Information can be found at: [email or web address] For questions related to vaccine distribution or appointments, please email [email or web address] or call [phone].    Please schedule appointment with your orthopedic doctor

## 2019-04-05 NOTE — Progress Notes (Signed)
Patient [facility] and Sickle Cell Care   Established Patient Office Visit  Subjective:  Patient ID: Gregory Bryant, male    DOB: January 08, 1976  Age: 44 y.o. MRN: [id number]  CC:  Chief Complaint  Patient presents with  . Sickle Cell Anemia  . Knee Pain    both knees   . Diabetes    HPI Gregory Bryant, a pleasant 44 year old male with a medical history significant for sickle cell disease, type 2 diabetes mellitus, and chronic knee pain presents for a follow up of chronic conditions. Patient says that chronic knee pain has been increasing over the past several weeks. He has difficulty going up and down stairs. He has been taking Oxycodone without sustained relief. Patient has well controlled sickle cell disease with infrequent pain crisis.   Knee Pain  There was no injury mechanism. The pain is present in the left knee and right knee. The quality of the pain is described as aching. The pain is at a severity of 6/10. The symptoms are aggravated by movement and weight bearing. He has tried acetaminophen and rest for the symptoms. The treatment provided no relief.  Diabetes His disease course has been stable. Pertinent negatives for diabetes include no blurred vision, no chest pain, no fatigue, no foot paresthesias, no foot ulcerations, no polydipsia, no polyphagia, no polyuria, no visual change and no weakness. Symptoms are stable. Current diabetic treatment includes oral agent (dual therapy). He is compliant with treatment all of the time. His weight is stable. He is following a generally healthy diet. He has not had a previous visit with a dietitian. He participates in exercise intermittently. He does not see a podiatrist.Eye exam is current.   Sickle cell disease Sickle cell disease has been well controlled. Patient has infrequent pain crises. He is not followed by hematology at this time. Patient hydrates consistently and takes folic acid. He denies headache,  blurred vision, fatigue, urinary symptoms, nausea, vomiting, or diarrhea.   Past Medical History:  Diagnosis Date  . Diabetes mellitus without complication ([place])   . Sickle cell anemia (HCC)     Past Surgical History:  Procedure Laterality Date  . CHOLECYSTECTOMY      Family History  Problem Relation Age of Onset  . Diabetes Mother     Social History   Socioeconomic History  . Marital status: Divorced    Spouse name: Not on file  . Number of children: Not on file  . Years of education: Not on file  . Highest education level: Not on file  Occupational History  . Not on file  Tobacco Use  . Smoking status: Never Smoker  . Smokeless tobacco: Never Used  Substance and Sexual Activity  . Alcohol use: No  . Drug use: No  . Sexual activity: Not on file  Other Topics Concern  . Not on file  Social History Narrative  . Not on file   Social Determinants of Health   Financial Resource Strain:   . Difficulty of Paying Living Expenses: Not on file  Food Insecurity:   . Worried About Charity fundraiser in the Last Year: Not on file  . Ran Out of Food in the Last Year: Not on file  Transportation Needs:   . Lack of Transportation (Medical): Not on file  . Lack of Transportation (Non-Medical): Not on file  Physical Activity:   . Days of Exercise per Week: Not on file  . Minutes of Exercise per Session:  Not on file  Stress:   . Feeling of Stress : Not on file  Social Connections:   . Frequency of Communication with Friends and Family: Not on file  . Frequency of Social Gatherings with Friends and Family: Not on file  . Attends Religious Services: Not on file  . Active Member of Clubs or Organizations: Not on file  . Attends Archivist Meetings: Not on file  . Marital Status: Not on file  Intimate Partner Violence:   . Fear of Current or Ex-Partner: Not on file  . Emotionally Abused: Not on file  . Physically Abused: Not on file  . Sexually Abused: Not on  file    Outpatient Medications Prior to Visit  Medication Sig Dispense Refill  . cetirizine (ZYRTEC) 10 MG tablet Take 1 tablet (10 mg total) by mouth daily. 30 tablet 11  . fluticasone (FLONASE) 50 MCG/ACT nasal spray Place 2 sprays into both nostrils daily. 16 g 6  . ibuprofen (ADVIL,MOTRIN) 600 MG tablet Take 1 tablet (600 mg total) by mouth every 8 (eight) hours as needed. 30 tablet 2  . ondansetron (ZOFRAN ODT) 8 MG disintegrating tablet Take 1 tablet (8 mg total) by mouth every 8 (eight) hours as needed for nausea or vomiting. 20 tablet 0  . folic acid (FOLVITE) 1 MG tablet Take 1 tablet (1 mg total) by mouth daily. 30 tablet 11  . glipiZIDE (GLUCOTROL) 10 MG tablet Take 1 tablet (10 mg total) by mouth daily before breakfast. 30 tablet 5  . oxyCODONE (ROXICODONE) 15 MG immediate release tablet Take 1 tablet (15 mg total) by mouth every 4 (four) hours as needed for up to 15 days for pain. 60 tablet 0  . Vitamin D, Ergocalciferol, (DRISDOL) 1.25 MG (50000 UT) CAPS capsule Take 1 capsule (50,000 Units total) by mouth every 7 (seven) days. 30 capsule 1  . metFORMIN (GLUCOPHAGE) 1000 MG tablet Take 1 tablet (1,000 mg total) by mouth 2 (two) times daily with a meal. (Patient not taking: Reported on 04/05/2019) 180 tablet 2   No facility-administered medications prior to visit.    Allergies  Allergen Reactions  . Aspirin Other (See Comments)    unknown  . Sulfa Antibiotics Other (See Comments)    unknown    ROS Review of Systems  Constitutional: Negative for activity change, appetite change and fatigue.  HENT: Negative.   Eyes: Negative for blurred vision, discharge and itching.  Respiratory: Negative.   Cardiovascular: Negative.  Negative for chest pain.  Gastrointestinal: Negative.   Endocrine: Negative.  Negative for polydipsia, polyphagia and polyuria.  Genitourinary: Negative.   Musculoskeletal: Positive for arthralgias (bilateral knee pain).  Skin: Negative.   Neurological:  Negative.  Negative for weakness.  Hematological: Negative.   Psychiatric/Behavioral: Negative.       Objective:    Physical Exam  Constitutional: He is oriented to person, place, and time. He appears well-developed and well-nourished.  HENT:  Head: Normocephalic.  Eyes: Pupils are equal, round, and reactive to light.  Cardiovascular: Normal rate and regular rhythm.  Pulmonary/Chest: Effort normal.  Abdominal: Soft. Bowel sounds are normal.  Musculoskeletal:        General: Normal range of motion.     Cervical back: Normal range of motion.  Neurological: He is alert and oriented to person, place, and time.  Skin: Skin is warm.  Psychiatric: He has a normal mood and affect. His behavior is normal. Judgment and thought content normal.    BP Marland Kitchen)  146/90 (BP Location: Left Arm, Patient Position: Sitting, Cuff Size: Normal)   Pulse 93   Temp 98.7 F (37.1 C) (Oral)   Resp 16   Ht 5\' 6"  (1.676 m)   Wt 174 lb (78.9 kg)   SpO2 97%   BMI 28.08 kg/m  Wt Readings from Last 3 Encounters:  04/05/19 174 lb (78.9 kg)  10/08/18 166 lb (75.3 kg)  07/08/18 172 lb (78 kg)     Health Maintenance Due  Topic Date Due  . Meningococcal B Vaccine (1 of 4 - Increased Risk Bexsero 2-dose series) 05/28/1985    There are no preventive care reminders to display for this patient.  No results found for: TSH Lab Results  Component Value Date   WBC 17.0 (H) 10/08/2018   HGB 12.2 (L) 10/08/2018   HCT 35.8 (L) 10/08/2018   MCV 83 10/08/2018   PLT 440 10/08/2018   Lab Results  Component Value Date   NA 142 10/08/2018   K 4.3 10/08/2018   CO2 19 (L) 10/08/2018   GLUCOSE 95 10/08/2018   BUN 9 10/08/2018   CREATININE 0.82 10/08/2018   BILITOT 3.0 (H) 10/08/2018   ALKPHOS 109 10/08/2018   AST 28 10/08/2018   ALT 32 10/08/2018   PROT 7.5 10/08/2018   ALBUMIN 4.5 10/08/2018   CALCIUM 10.0 10/08/2018   ANIONGAP 11 01/11/2018   No results found for: CHOL No results found for: HDL No  results found for: LDLCALC No results found for: TRIG No results found for: CHOLHDL Lab Results  Component Value Date   HGBA1C 5.2 04/07/2018      Assessment & Plan:   Problem List Items Addressed This Visit      Endocrine   Diabetes ([place])   Relevant Medications   glipiZIDE (GLUCOTROL) 10 MG tablet   metFORMIN (GLUCOPHAGE) 1000 MG tablet   Other Relevant Orders   Urinalysis Dipstick (Completed)   Vitamin D, 25-hydroxy   CBC with Differential   CMP and Liver   Fructosamine     Musculoskeletal and Integument   Avascular necrosis (HCC)     Other   Sickle cell pain crisis (HCC) - Primary   Relevant Medications   folic acid (FOLVITE) 1 MG tablet   Other Relevant Orders   [id number] 11+Oxyco+Alc+Crt-Bund   Sickle cell anemia with pain (HCC)   Relevant Medications   oxyCODONE (ROXICODONE) 15 MG immediate release tablet   Vitamin D, Ergocalciferol, (DRISDOL) 1.25 MG (50000 UNIT) CAPS capsule   folic acid (FOLVITE) 1 MG tablet    Other Visit Diagnoses    Vitamin D deficiency       Relevant Orders   Vitamin D, 25-hydroxy      Meds ordered this encounter  Medications  . glipiZIDE (GLUCOTROL) 10 MG tablet    Sig: Take 1 tablet (10 mg total) by mouth daily before breakfast.    Dispense:  30 tablet    Refill:  5    Order Specific Question:   Supervising Provider    Answer:   Tresa Garter [id number]  . metFORMIN (GLUCOPHAGE) 1000 MG tablet    Sig: Take 1 tablet (1,000 mg total) by mouth 2 (two) times daily with a meal.    Dispense:  180 tablet    Refill:  2    Order Specific Question:   Supervising Provider    Answer:   Tresa Garter [id number]  . oxyCODONE (ROXICODONE) 15 MG immediate release tablet    Sig: Take 1 tablet (  15 mg total) by mouth every 4 (four) hours as needed for up to 15 days for pain.    Dispense:  60 tablet    Refill:  0    d57.1    Order Specific Question:   Supervising Provider    Answer:   Tresa Garter [id number]  . Vitamin D,  Ergocalciferol, (DRISDOL) 1.25 MG (50000 UNIT) CAPS capsule    Sig: Take 1 capsule (50,000 Units total) by mouth every 7 (seven) days.    Dispense:  30 capsule    Refill:  1    Order Specific Question:   Supervising Provider    Answer:   Tresa Garter [id number]  . folic acid (FOLVITE) 1 MG tablet    Sig: Take 1 tablet (1 mg total) by mouth daily.    Dispense:  30 tablet    Refill:  11    Order Specific Question:   Supervising Provider    Answer:   Tresa Garter [id number]    Follow-up: Return in about 3 months (around 07/06/2019) for sickle cell anemia.    Donia Pounds  APRN, MSN, FNP-C [facility] [address] [place], [place] [address] [phone]

## 2019-04-06 LAB — DRUG SCREEN 764883 11+OXYCO+ALC+CRT-BUND
Amphetamines, Urine: NEGATIVE ng/mL
BENZODIAZ UR QL: NEGATIVE ng/mL
Barbiturate: NEGATIVE ng/mL
Cannabinoid Quant, Ur: NEGATIVE ng/mL
Cocaine (Metabolite): NEGATIVE ng/mL
Creatinine: 81.6 mg/dL (ref 20.0–300.0)
Ethanol: NEGATIVE %
Meperidine: NEGATIVE ng/mL
Methadone Screen, Urine: NEGATIVE ng/mL
OPIATE SCREEN URINE: NEGATIVE ng/mL
Oxycodone/Oxymorphone, Urine: NEGATIVE ng/mL
Phencyclidine: NEGATIVE ng/mL
Propoxyphene: NEGATIVE ng/mL
Tramadol: NEGATIVE ng/mL
pH, Urine: 5.8 (ref 4.5–8.9)

## 2019-04-06 LAB — CBC WITH DIFFERENTIAL/PLATELET
Basophils Absolute: 0.2 10*3/uL (ref 0.0–0.2)
Basos: 1 %
EOS (ABSOLUTE): 0.5 10*3/uL — ABNORMAL HIGH (ref 0.0–0.4)
Eos: 3 %
Hematocrit: 40.6 % (ref 37.5–51.0)
Hemoglobin: 13.7 g/dL (ref 13.0–17.7)
Immature Grans (Abs): 0.2 10*3/uL — ABNORMAL HIGH (ref 0.0–0.1)
Immature Granulocytes: 1 %
Lymphocytes Absolute: 5 10*3/uL — ABNORMAL HIGH (ref 0.7–3.1)
Lymphs: 30 %
MCH: 28.7 pg (ref 26.6–33.0)
MCHC: 33.7 g/dL (ref 31.5–35.7)
MCV: 85 fL (ref 79–97)
Monocytes Absolute: 1 10*3/uL — ABNORMAL HIGH (ref 0.1–0.9)
Monocytes: 6 %
NRBC: 2 % — ABNORMAL HIGH (ref 0–0)
Neutrophils Absolute: 9.8 10*3/uL — ABNORMAL HIGH (ref 1.4–7.0)
Neutrophils: 59 %
Platelets: 411 10*3/uL (ref 150–450)
RBC: 4.78 x10E6/uL (ref 4.14–5.80)
RDW: 17 % — ABNORMAL HIGH (ref 11.6–15.4)
WBC: 16.6 10*3/uL — ABNORMAL HIGH (ref 3.4–10.8)

## 2019-04-06 LAB — CMP AND LIVER
ALT: 26 IU/L (ref 0–44)
AST: 32 IU/L (ref 0–40)
Albumin: 4.5 g/dL (ref 4.0–5.0)
Alkaline Phosphatase: 110 IU/L (ref 39–117)
BUN: 7 mg/dL (ref 6–24)
Bilirubin Total: 3.2 mg/dL — ABNORMAL HIGH (ref 0.0–1.2)
Bilirubin, Direct: 0.52 mg/dL — ABNORMAL HIGH (ref 0.00–0.40)
CO2: 20 mmol/L (ref 20–29)
Calcium: 10 mg/dL (ref 8.7–10.2)
Chloride: 104 mmol/L (ref 96–106)
Creatinine, Ser: 0.81 mg/dL (ref 0.76–1.27)
GFR calc Af Amer: 126 mL/min/{1.73_m2} (ref >59)
GFR calc non Af Amer: 109 mL/min/{1.73_m2} (ref >59)
Glucose: 184 mg/dL — ABNORMAL HIGH (ref 65–99)
Potassium: 4.2 mmol/L (ref 3.5–5.2)
Sodium: 138 mmol/L (ref 134–144)
Total Protein: 7.7 g/dL (ref 6.0–8.5)

## 2019-04-06 LAB — FRUCTOSAMINE: Fructosamine: 519 umol/L — ABNORMAL HIGH (ref 0–285)

## 2019-04-06 LAB — VITAMIN D 25 HYDROXY (VIT D DEFICIENCY, FRACTURES): Vit D, 25-Hydroxy: 41.7 ng/mL (ref 30.0–100.0)

## 2019-04-13 ENCOUNTER — Ambulatory Visit (INDEPENDENT_AMBULATORY_CARE_PROVIDER_SITE_OTHER): Payer: Medicaid Other | Admitting: Orthopedic Surgery

## 2019-04-13 ENCOUNTER — Ambulatory Visit: Payer: Self-pay

## 2019-04-13 ENCOUNTER — Ambulatory Visit (INDEPENDENT_AMBULATORY_CARE_PROVIDER_SITE_OTHER): Payer: Medicaid Other

## 2019-04-13 ENCOUNTER — Encounter: Payer: Self-pay | Admitting: Orthopedic Surgery

## 2019-04-13 ENCOUNTER — Other Ambulatory Visit: Payer: Self-pay

## 2019-04-13 VITALS — Ht 66.0 in | Wt 175.0 lb

## 2019-04-13 DIAGNOSIS — M25562 Pain in left knee: Secondary | ICD-10-CM

## 2019-04-13 DIAGNOSIS — M659 Synovitis and tenosynovitis, unspecified: Secondary | ICD-10-CM

## 2019-04-13 DIAGNOSIS — M25561 Pain in right knee: Secondary | ICD-10-CM | POA: Diagnosis not present

## 2019-04-13 MED ORDER — MELOXICAM 15 MG PO TABS: 15.0000 mg | ORAL_TABLET | Freq: Every day | ORAL | 0 refills | Status: DC

## 2019-04-15 ENCOUNTER — Encounter: Payer: Self-pay | Admitting: Orthopedic Surgery

## 2019-04-15 MED ORDER — METHYLPREDNISOLONE ACETATE 40 MG/ML IJ SUSP
40.0000 mg | INTRAMUSCULAR | Status: AC | PRN
  Administered 2019-04-15: 22:00:00 40 mg via INTRA_ARTICULAR

## 2019-04-15 MED ORDER — BUPIVACAINE HCL 0.25 % IJ SOLN
4.0000 mL | INTRAMUSCULAR | Status: AC | PRN
  Administered 2019-04-15: 4 mL via INTRA_ARTICULAR

## 2019-04-15 MED ORDER — LIDOCAINE HCL 1 % IJ SOLN
5.0000 mL | INTRAMUSCULAR | Status: AC | PRN
  Administered 2019-04-15: 5 mL

## 2019-04-15 NOTE — Progress Notes (Signed)
Office Visit Note   Patient: Gregory Bryant           Date of Birth: 01-03-1976           MRN: [id number] Visit Date: 04/13/2019 Requested by: Tresa Garter, MD [place],  [place] [id number] PCP: Tresa Garter, MD  Subjective: Chief Complaint  Patient presents with  . Right Knee - Pain  . Left Knee - Pain    HPI: Gregory Bryant is a 44 y.o. male who presents to the office complaining of bilateral knee pain, right greater than left.  Patient notes 2 months of pain.  He denies any injury leading to the onset of pain.  He notes diffuse pain throughout both knees.  Pain is worse with squatting, ascending stairs, ascending a ladder.  Occasionally gives way on him but denies any mechanical symptoms.  Denies any groin pain, low back pain, radicular pain, history of knee surgery.  Does have a history of bilateral shoulder avascular necrosis and sickle cell anemia.  He also notes a history of diabetes for which he takes Metformin.  His last blood glucose was measured by him yesterday and found to be 154.  He takes oxycodone 15 mg as needed for sickle cell crises..                ROS:  All systems reviewed are negative as they relate to the chief complaint within the history of present illness.  Patient denies fevers or chills.  Assessment & Plan: Visit Diagnoses:  1. Medial joint line tenderness of right knee   2. Pain in both knees, unspecified chronicity   3. Medial joint line tenderness of left knee     Plan: Patient is a 44 year old male who presents complaining of bilateral knee pain.  Patient notes the majority of his pain is from his right knee.  He has a history of sickle cell anemia and bilateral shoulder avascular necrosis.  On exam patient has medial joint line tenderness of both knees as well as pes anserine tenderness bilaterally.  No effusion is present.  We will try a trial of meloxicam for 2 weeks.  Also administered a right knee injection today.   Patient will follow up in 10 days for clinical recheck with left knee injection if he had relief from the right knee injection.  Radiographs taken today reveal no fracture or dislocation or significant degenerative changes.  Meniscal pathology is possible but unlikely with absence of effusion and mechanical symptoms.  No evidence of hip AVN on examination today referring pain to either knee.  Follow-Up Instructions: No follow-ups on file.   Orders:  Orders Placed This Encounter  Procedures  . XR KNEE 3 VIEW RIGHT  . XR KNEE 3 VIEW LEFT   Meds ordered this encounter  Medications  . meloxicam (MOBIC) 15 MG tablet    Sig: Take 1 tablet (15 mg total) by mouth daily.    Dispense:  14 tablet    Refill:  0      Procedures: Large Joint Inj: R knee on 04/15/2019 10:13 PM Indications: diagnostic evaluation, joint swelling and pain Details: 18 G 1.5 in needle, superolateral approach  Arthrogram: No  Medications: 5 mL lidocaine 1 %; 40 mg methylPREDNISolone acetate 40 MG/ML; 4 mL bupivacaine 0.25 % Outcome: tolerated well, no immediate complications Procedure, treatment alternatives, risks and benefits explained, specific risks discussed. Consent was given by the patient. Immediately prior to procedure a time out was  called to verify the correct patient, procedure, equipment, support staff and site/side marked as required. Patient was prepped and draped in the usual sterile fashion.       Clinical Data: No additional findings.  Objective: Vital Signs: Ht 5\' 6"  (1.676 m)   Wt 175 lb (79.4 kg)   BMI 28.25 kg/m   Physical Exam:  Constitutional: Patient appears well-developed HEENT:  Head: Normocephalic Eyes:EOM are normal Neck: Normal range of motion Cardiovascular: Normal rate Pulmonary/chest: Effort normal Neurologic: Patient is alert Skin: Skin is warm Psychiatric: Patient has normal mood and affect  Ortho Exam:  Bilateral knee Exam Tender to palpation over the medial  joint line bilaterally.  Tender to palpation over the pes anserine bursa bilaterally. No effusion Extensor mechanism intact No TTP over the lateral jointlines, quad tendon, patellar tendon, patella, tibial tubercle, LCL/MCL insertions Stable to varus/valgus stresses.  Stable to anterior/posterior drawer Extension to 0 degrees Flexion > 90 degrees  Specialty Comments:  No specialty comments available.  Imaging: No results found.   PMFS History: Patient Active Problem List   Diagnosis Date Noted  . Sickle cell pain crisis ([place]) 01/11/2018  . Sickle cell anemia with pain ([place]) 07/22/2017  . Chronic musculoskeletal pain 03/30/2017  . Avascular necrosis ([place]) 03/30/2017  . Asthma, mild intermittent 03/30/2017  . Diabetes ([place]) 02/07/2017  . 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 Diabetes mellitus without complication ([place])   . Sickle cell anemia (HCC)     Family History  Problem Relation Age of Onset  . Diabetes Mother     Past Surgical History:  Procedure Laterality Date  . CHOLECYSTECTOMY     Social History   Occupational History  . Not on file  Tobacco Use  . Smoking status: Never Smoker  . Smokeless tobacco: Never Used  Substance and Sexual Activity  . Alcohol use: No  . Drug use: No  . Sexual activity: Not on file

## 2019-04-25 ENCOUNTER — Other Ambulatory Visit: Payer: Self-pay

## 2019-04-25 ENCOUNTER — Ambulatory Visit (INDEPENDENT_AMBULATORY_CARE_PROVIDER_SITE_OTHER): Payer: Medicaid Other | Admitting: Orthopedic Surgery

## 2019-04-25 ENCOUNTER — Encounter: Payer: Self-pay | Admitting: Orthopedic Surgery

## 2019-04-25 DIAGNOSIS — M659 Synovitis and tenosynovitis, unspecified: Secondary | ICD-10-CM | POA: Diagnosis not present

## 2019-04-25 MED ORDER — LIDOCAINE HCL 1 % IJ SOLN
5.0000 mL | INTRAMUSCULAR | Status: AC | PRN
  Administered 2019-04-25: 5 mL

## 2019-04-25 MED ORDER — BUPIVACAINE HCL 0.25 % IJ SOLN
4.0000 mL | INTRAMUSCULAR | Status: AC | PRN
  Administered 2019-04-25: 4 mL via INTRA_ARTICULAR

## 2019-04-25 MED ORDER — METHYLPREDNISOLONE ACETATE 40 MG/ML IJ SUSP
40.0000 mg | INTRAMUSCULAR | Status: AC | PRN
  Administered 2019-04-25: 40 mg via INTRA_ARTICULAR

## 2019-04-25 NOTE — Progress Notes (Signed)
   Procedure Note  Patient: Gregory Bryant             Date of Birth: 11/06/1975           MRN: [id number]             Visit Date: 04/25/2019  Procedures: Visit Diagnoses:  1. Synovitis of left knee     Large Joint Inj: L knee on 04/25/2019 2:23 PM Indications: diagnostic evaluation, joint swelling and pain Details: 18 G 1.5 in needle, superolateral approach  Arthrogram: No  Medications: 5 mL lidocaine 1 %; 40 mg methylPREDNISolone acetate 40 MG/ML; 4 mL bupivacaine 0.25 % Outcome: tolerated well, no immediate complications Procedure, treatment alternatives, risks and benefits explained, specific risks discussed. Consent was given by the patient. Immediately prior to procedure a time out was called to verify the correct patient, procedure, equipment, support staff and site/side marked as required. Patient was prepped and draped in the usual sterile fashion.

## 2019-05-01 ENCOUNTER — Other Ambulatory Visit: Payer: Self-pay

## 2019-05-01 ENCOUNTER — Encounter (HOSPITAL_COMMUNITY): Payer: Self-pay | Admitting: Emergency Medicine

## 2019-05-01 ENCOUNTER — Emergency Department (HOSPITAL_COMMUNITY)
Admission: EM | Admit: 2019-05-01 | Discharge: 2019-05-01 | Disposition: A | Discharge status: Home / Self Care | Payer: Medicaid Other | Attending: Emergency Medicine | Admitting: Emergency Medicine

## 2019-05-01 DIAGNOSIS — D5700 Hb-SS disease with crisis, unspecified: Secondary | ICD-10-CM | POA: Insufficient documentation

## 2019-05-01 DIAGNOSIS — J45909 Unspecified asthma, uncomplicated: Secondary | ICD-10-CM | POA: Insufficient documentation

## 2019-05-01 DIAGNOSIS — Z79899 Other long term (current) drug therapy: Secondary | ICD-10-CM | POA: Insufficient documentation

## 2019-05-01 DIAGNOSIS — E119 Type 2 diabetes mellitus without complications: Secondary | ICD-10-CM | POA: Insufficient documentation

## 2019-05-01 DIAGNOSIS — Z7984 Long term (current) use of oral hypoglycemic drugs: Secondary | ICD-10-CM | POA: Insufficient documentation

## 2019-05-01 LAB — RETICULOCYTES
Immature Retic Fract: 41.6 % — ABNORMAL HIGH (ref 2.3–15.9)
RBC.: 4.2 MIL/uL — ABNORMAL LOW (ref 4.22–5.81)
Retic Count, Absolute: 330.5 10*3/uL — ABNORMAL HIGH (ref 19.0–186.0)
Retic Ct Pct: 8.1 % — ABNORMAL HIGH (ref 0.4–3.1)

## 2019-05-01 LAB — COMPREHENSIVE METABOLIC PANEL
ALT: 29 U/L (ref 0–44)
AST: 29 U/L (ref 15–41)
Albumin: 4.5 g/dL (ref 3.5–5.0)
Alkaline Phosphatase: 108 U/L (ref 38–126)
Anion gap: 8 (ref 5–15)
BUN: 10 mg/dL (ref 6–20)
CO2: 24 mmol/L (ref 22–32)
Calcium: 9.4 mg/dL (ref 8.9–10.3)
Chloride: 105 mmol/L (ref 98–111)
Creatinine, Ser: 0.82 mg/dL (ref 0.61–1.24)
GFR calc Af Amer: >60 mL/min (ref >60)
GFR calc non Af Amer: >60 mL/min (ref >60)
Glucose, Bld: 252 mg/dL — ABNORMAL HIGH (ref 70–99)
Potassium: 3.9 mmol/L (ref 3.5–5.1)
Sodium: 137 mmol/L (ref 135–145)
Total Bilirubin: 3.6 mg/dL — ABNORMAL HIGH (ref 0.3–1.2)
Total Protein: 8.4 g/dL — ABNORMAL HIGH (ref 6.5–8.1)

## 2019-05-01 LAB — CBC WITH DIFFERENTIAL/PLATELET
Abs Immature Granulocytes: 0.41 10*3/uL — ABNORMAL HIGH (ref 0.00–0.07)
Basophils Absolute: 0.2 10*3/uL — ABNORMAL HIGH (ref 0.0–0.1)
Basophils Relative: 1 %
Eosinophils Absolute: 0.7 10*3/uL — ABNORMAL HIGH (ref 0.0–0.5)
Eosinophils Relative: 3 %
HCT: 34.8 % — ABNORMAL LOW (ref 39.0–52.0)
Hemoglobin: 12.2 g/dL — ABNORMAL LOW (ref 13.0–17.0)
Immature Granulocytes: 2 %
Lymphocytes Relative: 29 %
Lymphs Abs: 5.7 10*3/uL — ABNORMAL HIGH (ref 0.7–4.0)
MCH: 29.3 pg (ref 26.0–34.0)
MCHC: 35.1 g/dL (ref 30.0–36.0)
MCV: 83.7 fL (ref 80.0–100.0)
Monocytes Absolute: 1.5 10*3/uL — ABNORMAL HIGH (ref 0.1–1.0)
Monocytes Relative: 8 %
Neutro Abs: 11.2 10*3/uL — ABNORMAL HIGH (ref 1.7–7.7)
Neutrophils Relative %: 57 %
Platelets: 416 10*3/uL — ABNORMAL HIGH (ref 150–400)
RBC: 4.16 MIL/uL — ABNORMAL LOW (ref 4.22–5.81)
RDW: 16.7 % — ABNORMAL HIGH (ref 11.5–15.5)
WBC: 19.6 10*3/uL — ABNORMAL HIGH (ref 4.0–10.5)
nRBC: 2.5 % — ABNORMAL HIGH (ref 0.0–0.2)

## 2019-05-01 LAB — CBG MONITORING, ED: Glucose-Capillary: 255 mg/dL — ABNORMAL HIGH (ref 70–99)

## 2019-05-01 MED ORDER — HYDROMORPHONE HCL 2 MG/ML IJ SOLN
2.0000 mg | INTRAMUSCULAR | Status: AC
  Administered 2019-05-01: 2 mg via INTRAVENOUS
  Filled 2019-05-01: qty 1

## 2019-05-01 MED ORDER — KETOROLAC TROMETHAMINE 15 MG/ML IJ SOLN
15.0000 mg | INTRAMUSCULAR | Status: AC
  Administered 2019-05-01: 15 mg via INTRAVENOUS
  Filled 2019-05-01: qty 1

## 2019-05-01 MED ORDER — SODIUM CHLORIDE 0.45 % IV SOLN: INTRAVENOUS | Status: DC

## 2019-05-01 NOTE — ED Notes (Signed)
Lab called to add on lab work 

## 2019-05-01 NOTE — Discharge Instructions (Addendum)
Continue taking home medications as prescribed. Follow-up with your doctor as needed for further evaluation. Return to the emergency room with any new, worsening, concerning symptoms.

## 2019-05-01 NOTE — ED Notes (Signed)
Save tubes sent to lab. 

## 2019-05-01 NOTE — ED Notes (Signed)
Family at bedside. 

## 2019-05-01 NOTE — ED Triage Notes (Signed)
Patient here from home with complaints of sickle cell pain crisis bilateral arms since Thursday, home meds with no relief.

## 2019-05-01 NOTE — ED Provider Notes (Signed)
[facility] DEPT Provider Note   CSN: [id number] Arrival date & time: 05/01/19  0309     History Chief Complaint  Patient presents with  . Sickle Cell Pain Crisis    Gregory Bryant is a 44 y.o. male presenting for evaluation of sickle cell pain crisis and left arm pain.  Patient states his crisis began 3 days ago.  He had pain in his legs, back, and arms, as is typical for him.  He was able to control most of his pain with his home medicines except for his left arm.  He continues to have severe pain there.  He denies trauma or injury.  He denies numbness, tingling, redness, or swelling.  His last dose of home medicines was around 8:00 tonight.  He denies fevers, chills, chest pain, shortness of breath, cough.  Reports an additional history of diabetes, states his blood sugars have been mildly elevated in the 200s over the past several days.  He takes Metformin for this, not insulin.  No other medical problems.  He does not take any medications daily besides Metformin.  Additional history obtained from chart review.  Patient with a history of sickle cell and diabetes.  HPI     Past Medical History:  Diagnosis Date  . Diabetes mellitus without complication ([place])   . Sickle cell anemia [facility])     Patient Active Problem List   Diagnosis Date Noted  . Sickle cell pain crisis ([place]) 01/11/2018  . Sickle cell anemia with pain ([place]) 07/22/2017  . Chronic musculoskeletal pain 03/30/2017  . Avascular necrosis ([place]) 03/30/2017  . Asthma, mild intermittent 03/30/2017  . Diabetes ([place]) 02/07/2017  . 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 CHOLECYSTECTOMY         Family History  Problem Relation Age of Onset  . Diabetes Mother     Social History   Tobacco Use  . Smoking status: Never Smoker  . Smokeless tobacco: Never Used  Substance Use Topics  . Alcohol use: No  . Drug use: No    Home  Medications Prior to Admission medications   Medication Sig Start Date End Date Taking? Authorizing Provider  cetirizine (ZYRTEC) 10 MG tablet Take 1 tablet (10 mg total) by mouth daily. 07/08/18  Yes Lanae Boast, FNP  fluticasone (FLONASE) 50 MCG/ACT nasal spray Place 2 sprays into both nostrils daily. 07/08/18  Yes Lanae Boast, FNP  folic acid (FOLVITE) 1 MG tablet Take 1 tablet (1 mg total) by mouth daily. 04/05/19  Yes Dorena Dew, FNP  glipiZIDE (GLUCOTROL) 10 MG tablet Take 1 tablet (10 mg total) by mouth daily before breakfast. 04/05/19  Yes Dorena Dew, FNP  ibuprofen (ADVIL,MOTRIN) 600 MG tablet Take 1 tablet (600 mg total) by mouth every 8 (eight) hours as needed. 04/07/18  Yes Lanae Boast, FNP  meloxicam (MOBIC) 15 MG tablet Take 1 tablet (15 mg total) by mouth daily. 04/13/19  Yes Magnant, Charles L, PA-C  metFORMIN (GLUCOPHAGE) 1000 MG tablet Take 1 tablet (1,000 mg total) by mouth 2 (two) times daily with a meal. 04/05/19 07/04/19 Yes Dorena Dew, FNP  ondansetron (ZOFRAN ODT) 8 MG disintegrating tablet Take 1 tablet (8 mg total) by mouth every 8 (eight) hours as needed for nausea or vomiting. 04/07/18  Yes Lanae Boast, FNP  oxyCODONE (ROXICODONE) 15 MG immediate release tablet Take 1 tablet (15 mg total) by mouth every 4 (four) hours as needed for  up to 15 days for pain. 04/05/19 05/01/19 Yes Dorena Dew, FNP  Vitamin D, Ergocalciferol, (DRISDOL) 1.25 MG (50000 UNIT) CAPS capsule Take 1 capsule (50,000 Units total) by mouth every 7 (seven) days. 04/05/19  Yes Dorena Dew, FNP    Allergies    Aspirin and Sulfa antibiotics  Review of Systems   Review of Systems  Musculoskeletal: Positive for myalgias (Left arm).  All other systems reviewed and are negative.   Physical Exam Updated Vital Signs BP (!) 150/92   Pulse 95   Temp 98.3 F (36.8 C) (Oral)   Resp 17   Ht 5\' 6"  (1.676 m)   Wt 78.9 kg   SpO2 93%   BMI 28.08 kg/m   Physical Exam Vitals  and nursing note reviewed.  Constitutional:      General: He is not in acute distress.    Appearance: He is well-developed.     Comments: Appears nontoxic  HENT:     Head: Normocephalic and atraumatic.  Eyes:     Conjunctiva/sclera: Conjunctivae normal.     Pupils: Pupils are equal, round, and reactive to light.  Cardiovascular:     Rate and Rhythm: Normal rate and regular rhythm.  Pulmonary:     Effort: Pulmonary effort is normal. No respiratory distress.     Breath sounds: Normal breath sounds. No wheezing.  Abdominal:     General: There is no distension.     Palpations: Abdomen is soft. There is no mass.     Tenderness: There is no abdominal tenderness. There is no guarding or rebound.  Musculoskeletal:        General: Tenderness present.     Cervical back: Normal range of motion and neck supple.     Comments: No obvious swelling, deformity, warmth, or redness of the left arm.  Mild tenderness palpation of the left upper arm/shoulder.  Radial pulses 2+ bilaterally.  Grip strength slightly decreased on the left side, patient states is normal when he has a sickle crisis.  Equal sensation of upper extremities bilaterally  Skin:    General: Skin is warm and dry.     Capillary Refill: Capillary refill takes less than 2 seconds.  Neurological:     Mental Status: He is alert and oriented to person, place, and time.     ED Results / Procedures / Treatments   Labs (all labs ordered are listed, but only abnormal results are displayed) Labs Reviewed  CBC WITH DIFFERENTIAL/PLATELET - Abnormal; Notable for the following components:      Result Value   WBC 19.6 (*)    RBC 4.16 (*)    Hemoglobin 12.2 (*)    HCT 34.8 (*)    RDW 16.7 (*)    Platelets 416 (*)    nRBC 2.5 (*)    Neutro Abs 11.2 (*)    Lymphs Abs 5.7 (*)    Monocytes Absolute 1.5 (*)    Eosinophils Absolute 0.7 (*)    Basophils Absolute 0.2 (*)    Abs Immature Granulocytes 0.41 (*)    All other components within  normal limits  COMPREHENSIVE METABOLIC PANEL - Abnormal; Notable for the following components:   Glucose, Bld 252 (*)    Total Protein 8.4 (*)    Total Bilirubin 3.6 (*)    All other components within normal limits  CBG MONITORING, ED - Abnormal; Notable for the following components:   Glucose-Capillary 255 (*)    All other components within normal limits  RETICULOCYTES    EKG None  Radiology No results found.  Procedures Procedures (including critical care time)  Medications Ordered in ED Medications  0.45 % sodium chloride infusion ( Intravenous Stopped 05/01/19 0548)  ketorolac (TORADOL) 15 MG/ML injection 15 mg (15 mg Intravenous Given 05/01/19 0436)  HYDROmorphone (DILAUDID) injection 2 mg (2 mg Intravenous Given 05/01/19 0436)  HYDROmorphone (DILAUDID) injection 2 mg (2 mg Intravenous Given 05/01/19 0515)    ED Course  I have reviewed the triage vital signs and the nursing notes.  Pertinent labs & imaging results that were available during my care of the patient were reviewed by me and considered in my medical decision making (see chart for details).    MDM Rules/Calculators/A&P                      Patient presenting for evaluation of sickle cell pain crisis.  On exam, patient appears nontoxic.  He reports left arm pain, exam is not consistent with infection.  Doubt DVT.  Doubt MSK trauma.  Exam and history is not consistent with acute chest.  Will obtain labs,, treat symptomatically, reassess.  Labs interpreted by me.  Shows mild leukocytosis, this is similar to baseline.  Mild hyperbilirubinemia, similar to baseline.  On reassessment, patient reports pain is completely resolved.  Discussed continued symptomatic treatment as needed at home, and follow-up with PCP as needed.  At this time, patient appears safe for discharge.  Return precautions given.  Patient states he understands and agrees to plan.  Final Clinical Impression(s) / ED Diagnoses Final diagnoses:  Sickle  cell pain crisis [facility])    Rx / [place] Orders ED Discharge Orders    None       Franchot Heidelberg, PA-C 05/01/19 0559    Molpus, Jenny Reichmann, MD 05/01/19 [phone]

## 2019-06-06 ENCOUNTER — Telehealth: Payer: Self-pay | Admitting: Family Medicine

## 2019-06-07 ENCOUNTER — Other Ambulatory Visit: Payer: Self-pay | Admitting: Family Medicine

## 2019-06-07 DIAGNOSIS — D5700 Hb-SS disease with crisis, unspecified: Secondary | ICD-10-CM

## 2019-06-07 MED ORDER — OXYCODONE HCL 15 MG PO TABS: 15.0000 mg | ORAL_TABLET | ORAL | 0 refills | Status: DC | PRN

## 2019-06-07 NOTE — Progress Notes (Signed)
Reviewed PDMP substance reporting system prior to prescribing opiate medications. No inconsistencies noted.   Meds ordered this encounter  Medications  . oxyCODONE (ROXICODONE) 15 MG immediate release tablet    Sig: Take 1 tablet (15 mg total) by mouth every 4 (four) hours as needed for up to 15 days for pain.    Dispense:  60 tablet    Refill:  0    d57.1    Order Specific Question:   Supervising Provider    Answer:   Tresa Garter [id number]     Donia Pounds  APRN, MSN, FNP-C Patient [facility] [address] [place], [place] [address] [phone]

## 2019-06-08 NOTE — Telephone Encounter (Signed)
Done

## 2019-07-05 ENCOUNTER — Other Ambulatory Visit: Payer: Self-pay

## 2019-07-05 ENCOUNTER — Ambulatory Visit (INDEPENDENT_AMBULATORY_CARE_PROVIDER_SITE_OTHER): Payer: Self-pay | Admitting: Family Medicine

## 2019-07-05 ENCOUNTER — Encounter: Payer: Self-pay | Admitting: Family Medicine

## 2019-07-05 VITALS — BP 136/86 | HR 77 | Temp 97.9°F | Ht 66.0 in | Wt 170.2 lb

## 2019-07-05 DIAGNOSIS — G8929 Other chronic pain: Secondary | ICD-10-CM

## 2019-07-05 DIAGNOSIS — M7918 Myalgia, other site: Secondary | ICD-10-CM

## 2019-07-05 DIAGNOSIS — E119 Type 2 diabetes mellitus without complications: Secondary | ICD-10-CM

## 2019-07-05 DIAGNOSIS — D5700 Hb-SS disease with crisis, unspecified: Secondary | ICD-10-CM

## 2019-07-05 LAB — POCT URINALYSIS DIPSTICK
Bilirubin, UA: NEGATIVE
Blood, UA: NEGATIVE
Glucose, UA: NEGATIVE
Ketones, UA: NEGATIVE
Leukocytes, UA: NEGATIVE
Nitrite, UA: NEGATIVE
Protein, UA: NEGATIVE
Spec Grav, UA: 1.02 (ref 1.010–1.025)
Urobilinogen, UA: 1 E.U./dL
pH, UA: 6.5 (ref 5.0–8.0)

## 2019-07-05 NOTE — Progress Notes (Signed)
Patient [facility]   Established Patient Office Visit  Subjective:  Patient ID: Gregory Bryant, male    DOB: [birth year]  Age: 44 y.o. MRN: [id number]  CC:  Chief Complaint  Patient presents with  . Follow-up    3 month follow up sickle cell     HPI Gregory Bryant is a 44 year old male with a medical history significant for sickle cell disease, chronic pain syndrome, mild intermittent asthma, and type 2 diabetes mellitus that presents for a follow-up of chronic conditions.  Patient states that he has been doing fairly well and is without complaint on today.  He continues to have chronic pain that is primarily localized to low back and lower extremities.  He states the pain is well controlled on oxycodone, ibuprofen, and occasional Tylenol.  Patient hydrates consistently and takes folic acid daily.  He is having mild pain today and rates at 4/10.  He denies headache, chest pain, shortness of breath, urinary symptoms, nausea, vomiting, or diarrhea. Patient also has a long history of type 2 diabetes mellitus.  He endorses a family history.  He takes all prescribed medications consistently.  He states that he has a difficult time with a carbohydrate modified diet, he often does not know what to eat.  He has been to a dietitian in the past but has been lost to follow-up.  He denies polyuria, polydipsia, or polyphagia.  Past Medical History:  Diagnosis Date  . Diabetes mellitus without complication ([place])   . Sickle cell anemia (HCC)     Past Surgical History:  Procedure Laterality Date  . CHOLECYSTECTOMY      Family History  Problem Relation Age of Onset  . Diabetes Mother     Social History   Socioeconomic History  . Marital status: Divorced    Spouse name: Not on file  . Number of children: Not on file  . Years of education: Not on file  . Highest education level: Not on file  Occupational History  . Not on file  Tobacco Use  .  Smoking status: Never Smoker  . Smokeless tobacco: Never Used  Substance and Sexual Activity  . Alcohol use: No  . Drug use: No  . Sexual activity: Not on file  Other Topics Concern  . Not on file  Social History Narrative  . Not on file   Social Determinants of Health   Financial Resource Strain:   . Difficulty of Paying Living Expenses:   Food Insecurity:   . Worried About Charity fundraiser in the Last Year:   . Arboriculturist in the Last Year:   Transportation Needs:   . Film/video editor (Medical):   Marland Kitchen Lack of Transportation (Non-Medical):   Physical Activity:   . Days of Exercise per Week:   . Minutes of Exercise per Session:   Stress:   . Feeling of Stress :   Social Connections:   . Frequency of Communication with Friends and Family:   . Frequency of Social Gatherings with Friends and Family:   . Attends Religious Services:   . Active Member of Clubs or Organizations:   . Attends Archivist Meetings:   Marland Kitchen Marital Status:   Intimate Partner Violence:   . Fear of Current or Ex-Partner:   . Emotionally Abused:   Marland Kitchen Physically Abused:   . Sexually Abused:     Outpatient Medications Prior to Visit  Medication Sig Dispense Refill  .  cetirizine (ZYRTEC) 10 MG tablet Take 1 tablet (10 mg total) by mouth daily. 30 tablet 11  . fluticasone (FLONASE) 50 MCG/ACT nasal spray Place 2 sprays into both nostrils daily. 16 g 6  . folic acid (FOLVITE) 1 MG tablet Take 1 tablet (1 mg total) by mouth daily. 30 tablet 11  . glipiZIDE (GLUCOTROL) 10 MG tablet Take 1 tablet (10 mg total) by mouth daily before breakfast. 30 tablet 5  . ibuprofen (ADVIL,MOTRIN) 600 MG tablet Take 1 tablet (600 mg total) by mouth every 8 (eight) hours as needed. 30 tablet 2  . metFORMIN (GLUCOPHAGE) 1000 MG tablet Take 1 tablet (1,000 mg total) by mouth 2 (two) times daily with a meal. 180 tablet 2  . ondansetron (ZOFRAN ODT) 8 MG disintegrating tablet Take 1 tablet (8 mg total) by mouth  every 8 (eight) hours as needed for nausea or vomiting. 20 tablet 0  . oxyCODONE (ROXICODONE) 15 MG immediate release tablet Take 1 tablet (15 mg total) by mouth every 4 (four) hours as needed for up to 15 days for pain. 60 tablet 0  . Vitamin D, Ergocalciferol, (DRISDOL) 1.25 MG (50000 UNIT) CAPS capsule Take 1 capsule (50,000 Units total) by mouth every 7 (seven) days. 30 capsule 1  . meloxicam (MOBIC) 15 MG tablet Take 1 tablet (15 mg total) by mouth daily. (Patient not taking: Reported on 07/05/2019) 14 tablet 0   No facility-administered medications prior to visit.    Allergies  Allergen Reactions  . Aspirin Other (See Comments)    unknown  . Sulfa Antibiotics Other (See Comments)    unknown    ROS Review of Systems  Constitutional: Negative.  Negative for fatigue and fever.  HENT: Negative.   Eyes: Negative.   Respiratory: Negative.   Cardiovascular: Negative.   Gastrointestinal: Negative.   Endocrine: Negative.   Genitourinary: Negative.   Musculoskeletal: Positive for arthralgias and back pain.  Neurological: Negative.   Psychiatric/Behavioral: Negative.       Objective:    Physical Exam Constitutional:      Appearance: Normal appearance.  Eyes:     Pupils: Pupils are equal, round, and reactive to light.  Cardiovascular:     Rate and Rhythm: Normal rate and regular rhythm.     Pulses: Normal pulses.  Pulmonary:     Effort: Pulmonary effort is normal.     Breath sounds: Normal breath sounds.  Abdominal:     General: Bowel sounds are normal.  Neurological:     General: No focal deficit present.     Mental Status: He is alert. Mental status is at baseline.  Psychiatric:        Mood and Affect: Mood normal.        Thought Content: Thought content normal.        Judgment: Judgment normal.     BP 136/86 (BP Location: Left Arm, Patient Position: Sitting)   Pulse 77   Temp 97.9 F (36.6 C) (Temporal)   Ht 5\' 6"  (1.676 m)   Wt 170 lb 3.2 oz (77.2 kg)   SpO2  97%   BMI 27.47 kg/m  Wt Readings from Last 3 Encounters:  07/05/19 170 lb 3.2 oz (77.2 kg)  05/01/19 174 lb (78.9 kg)  04/13/19 175 lb (79.4 kg)     Health Maintenance Due  Topic Date Due  . Hepatitis C Screening  Never done  . Meningococcal B Vaccine (1 of 4 - Increased Risk Bexsero 2-dose series) Never done  .  COVID-19 Vaccine (1) Never done    There are no preventive care reminders to display for this patient.  No results found for: TSH Lab Results  Component Value Date   WBC 19.6 (H) 05/01/2019   HGB 12.2 (L) 05/01/2019   HCT 34.8 (L) 05/01/2019   MCV 83.7 05/01/2019   PLT 416 (H) 05/01/2019   Lab Results  Component Value Date   NA 137 05/01/2019   K 3.9 05/01/2019   CO2 24 05/01/2019   GLUCOSE 252 (H) 05/01/2019   BUN 10 05/01/2019   CREATININE 0.82 05/01/2019   BILITOT 3.6 (H) 05/01/2019   ALKPHOS 108 05/01/2019   AST 29 05/01/2019   ALT 29 05/01/2019   PROT 8.4 (H) 05/01/2019   ALBUMIN 4.5 05/01/2019   CALCIUM 9.4 05/01/2019   ANIONGAP 8 05/01/2019   No results found for: CHOL No results found for: HDL No results found for: LDLCALC No results found for: TRIG No results found for: CHOLHDL Lab Results  Component Value Date   HGBA1C 5.2 04/07/2018      Assessment & Plan:   Problem List Items Addressed This Visit      Endocrine   Diabetes ([place]) - Primary   Relevant Orders   POCT Urinalysis Dipstick (Completed)     Type 2 diabetes mellitus without complication, without long-term current use of insulin ([place]) The patient is asked to make an attempt to improve diet and exercise patterns to aid in medical management of this problem. - POCT Urinalysis Dipstick - Fructosamine  Sickle cell anemia with pain (HCC) Continue folic acid 1 mg daily Hydrate with 64 ounces of fluid daily - Sickle Cell Panel - Comprehensive metabolic panel  Chronic musculoskeletal pain Reviewed PDMP substance reporting system prior to prescribing opiate medications. No  inconsistencies noted.    - [id number] 11+Oxyco+Alc+Crt-Bund  Follow-up: Return in about 3 months (around 10/05/2019) for sickle cell anemia.     Donia Pounds  APRN, MSN, FNP-C Patient [facility] [address] [place], [place] [address] [phone] Came back

## 2019-07-05 NOTE — Patient Instructions (Signed)
COVID-19 Vaccine Information can be found at: [email or web address].uk For questions related to vaccine distribution or appointments, please email [email or web address] or call [phone].   Pfizer or [employer] vaccine recommended.     Sickle Cell Anemia, Adult  Sickle cell anemia is a condition where your red blood cells are shaped like sickles. Red blood cells carry oxygen through the body. Sickle-shaped cells do not live as long as normal red blood cells. They also clump together and block blood from flowing through the blood vessels. This prevents the body from getting enough oxygen. Sickle cell anemia causes organ damage and pain. It also increases the risk of infection. Follow these instructions at home: Medicines  Take over-the-counter and prescription medicines only as told by your doctor.  If you were prescribed an antibiotic medicine, take it as told by your doctor. Do not stop taking the antibiotic even if you start to feel better.  If you develop a fever, do not take medicines to lower the fever right away. Tell your doctor about the fever. Managing pain, stiffness, and swelling  Try these methods to help with pain: ? Use a heating pad. ? Take a warm bath. ? Distract yourself, such as by watching TV. Eating and drinking  Drink enough fluid to keep your pee (urine) clear or pale yellow. Drink more in hot weather and during exercise.  Limit or avoid alcohol.  Eat a healthy diet. Eat plenty of fruits, vegetables, whole grains, and lean protein.  Take vitamins and supplements as told by your doctor. Traveling  When traveling, keep these with you: ? Your medical information. ? The names of your doctors. ? Your medicines.  If you need to take an airplane, talk to your doctor first. Activity  Rest often.  Avoid exercises that make your heart beat much faster, such as jogging. General instructions  Do not use  products that have nicotine or tobacco, such as cigarettes and e-cigarettes. If you need help quitting, ask your doctor.  Consider wearing a medical alert bracelet.  Avoid being in high places (high altitudes), such as mountains.  Avoid very hot or cold temperatures.  Avoid places where the temperature changes a lot.  Keep all follow-up visits as told by your doctor. This is important. Contact a doctor if:  A joint hurts.  Your feet or hands hurt or swell.  You feel tired (fatigued). Get help right away if:  You have symptoms of infection. These include: ? Fever. ? Chills. ? Being very tired. ? Irritability. ? Poor eating. ? Throwing up (vomiting).  You feel dizzy or faint.  You have new stomach pain, especially on the left side.  You have a an erection (priapism) that lasts more than 4 hours.  You have numbness in your arms or legs.  You have a hard time moving your arms or legs.  You have trouble talking.  You have pain that does not go away when you take medicine.  You are short of breath.  You are breathing fast.  You have a long-term cough.  You have pain in your chest.  You have a bad headache.  You have a stiff neck.  Your stomach looks bloated even though you did not eat much.  Your skin is pale.  You suddenly cannot see well. Summary  Sickle cell anemia is a condition where your red blood cells are shaped like sickles.  Follow your doctor's advice on ways to manage pain, food to eat, activities to do, and steps to  take for safe travel.  Get medical help right away if you have any signs of infection, such as a fever. This information is not intended to replace advice given to you by your health care provider. Make sure you discuss any questions you have with your health care provider. Document Revised: 05/07/2018 Document Reviewed: 02/19/2016 Elsevier Patient Education  [place].

## 2019-07-06 LAB — CMP14+CBC/D/PLT+FER+RETIC+V...
ALT: 26 IU/L (ref 0–44)
AST: 29 IU/L (ref 0–40)
Albumin/Globulin Ratio: 1.3 (ref 1.2–2.2)
Albumin: 4.4 g/dL (ref 4.0–5.0)
Alkaline Phosphatase: 115 IU/L (ref 48–121)
BUN/Creatinine Ratio: 9 (ref 9–20)
BUN: 8 mg/dL (ref 6–24)
Basophils Absolute: 0.2 10*3/uL (ref 0.0–0.2)
Basos: 1 %
Bilirubin Total: 2.5 mg/dL — ABNORMAL HIGH (ref 0.0–1.2)
CO2: 22 mmol/L (ref 20–29)
Calcium: 9.9 mg/dL (ref 8.7–10.2)
Chloride: 98 mmol/L (ref 96–106)
Creatinine, Ser: 0.94 mg/dL (ref 0.76–1.27)
EOS (ABSOLUTE): 1 10*3/uL — ABNORMAL HIGH (ref 0.0–0.4)
Eos: 6 %
Ferritin: 1363 ng/mL — ABNORMAL HIGH (ref 30–400)
GFR calc Af Amer: 114 mL/min/{1.73_m2} (ref >59)
GFR calc non Af Amer: 98 mL/min/{1.73_m2} (ref >59)
Globulin, Total: 3.3 g/dL (ref 1.5–4.5)
Glucose: 175 mg/dL — ABNORMAL HIGH (ref 65–99)
Hematocrit: 38.1 % (ref 37.5–51.0)
Hemoglobin: 12.9 g/dL — ABNORMAL LOW (ref 13.0–17.7)
Immature Grans (Abs): 0.3 10*3/uL — ABNORMAL HIGH (ref 0.0–0.1)
Immature Granulocytes: 2 %
Lymphocytes Absolute: 6.3 10*3/uL — ABNORMAL HIGH (ref 0.7–3.1)
Lymphs: 37 %
MCH: 28.4 pg (ref 26.6–33.0)
MCHC: 33.9 g/dL (ref 31.5–35.7)
MCV: 84 fL (ref 79–97)
Monocytes Absolute: 1.1 10*3/uL — ABNORMAL HIGH (ref 0.1–0.9)
Monocytes: 6 %
NRBC: 2 % — ABNORMAL HIGH (ref 0–0)
Neutrophils Absolute: 8.3 10*3/uL — ABNORMAL HIGH (ref 1.4–7.0)
Neutrophils: 48 %
Platelets: 404 10*3/uL (ref 150–450)
Potassium: 4.4 mmol/L (ref 3.5–5.2)
RBC: 4.54 x10E6/uL (ref 4.14–5.80)
RDW: 15.7 % — ABNORMAL HIGH (ref 11.6–15.4)
Retic Ct Pct: 6.8 % — ABNORMAL HIGH (ref 0.6–2.6)
Sodium: 133 mmol/L — ABNORMAL LOW (ref 134–144)
Total Protein: 7.7 g/dL (ref 6.0–8.5)
Vit D, 25-Hydroxy: 28.2 ng/mL — ABNORMAL LOW (ref 30.0–100.0)
WBC: 17.2 10*3/uL — ABNORMAL HIGH (ref 3.4–10.8)

## 2019-07-06 LAB — DRUG SCREEN 764883 11+OXYCO+ALC+CRT-BUND
Amphetamines, Urine: NEGATIVE ng/mL
BENZODIAZ UR QL: NEGATIVE ng/mL
Barbiturate: NEGATIVE ng/mL
Cannabinoid Quant, Ur: NEGATIVE ng/mL
Cocaine (Metabolite): NEGATIVE ng/mL
Creatinine: 81.2 mg/dL (ref 20.0–300.0)
Ethanol: NEGATIVE %
Meperidine: NEGATIVE ng/mL
Methadone Screen, Urine: NEGATIVE ng/mL
OPIATE SCREEN URINE: NEGATIVE ng/mL
Oxycodone/Oxymorphone, Urine: NEGATIVE ng/mL
Phencyclidine: NEGATIVE ng/mL
Propoxyphene: NEGATIVE ng/mL
Tramadol: NEGATIVE ng/mL
pH, Urine: 6.1 (ref 4.5–8.9)

## 2019-07-06 LAB — FRUCTOSAMINE: Fructosamine: 522 umol/L — ABNORMAL HIGH (ref 0–285)

## 2019-07-08 ENCOUNTER — Other Ambulatory Visit: Payer: Self-pay | Admitting: Family Medicine

## 2019-07-08 DIAGNOSIS — E119 Type 2 diabetes mellitus without complications: Secondary | ICD-10-CM

## 2019-07-08 NOTE — Progress Notes (Signed)
Orders Placed This Encounter  Procedures   Ambulatory referral to diabetic education    Referral Priority:   Routine    Referral Type:   Consultation    Referral Reason:   Specialty Services Required    Number of Visits Requested:   1     Melesa Lecy Moore Ajeenah Heiny  APRN, MSN, FNP-C [facility] [address]  [place], [place] [address] [phone]  

## 2019-07-14 DIAGNOSIS — K5903 Drug induced constipation: Secondary | ICD-10-CM | POA: Insufficient documentation

## 2019-07-15 ENCOUNTER — Telehealth: Payer: Self-pay

## 2019-07-15 NOTE — Telephone Encounter (Signed)
Called and notified pt of his results , confirmed with patient that his taking medication daily. Patient is going following up in 3 months.

## 2019-07-15 NOTE — Telephone Encounter (Signed)
-----   Message from Dorena Dew, [place] sent at 07/08/2019 11:38 AM EDT ----- Regarding: lab results Please inform patient that fructosamine is elevated at 522, which is indicative of uncontrolled type 2 DM. Ensure that patient is taking Metformin 1000 mg twice daily and Glucotrol 10 mg daily with breakfast.   I have sent a referral to diabetic education for assistance with diet assistance. Will repeat fructosamine in 3 months.    Donia Pounds  APRN, MSN, FNP-C [facility] [address] [place], [place] [address] [phone]

## 2019-08-15 ENCOUNTER — Encounter: Payer: Self-pay | Admitting: Registered"

## 2019-08-15 ENCOUNTER — Encounter: Payer: Medicaid Other | Attending: Family Medicine | Admitting: Registered"

## 2019-08-15 ENCOUNTER — Other Ambulatory Visit: Payer: Self-pay

## 2019-08-15 DIAGNOSIS — E1165 Type 2 diabetes mellitus with hyperglycemia: Secondary | ICD-10-CM

## 2019-08-15 NOTE — Progress Notes (Signed)
Diabetes Self-Management Education  Visit Type: First/Initial  Appt. Start Time: 0920 Appt. End Time: 1030  08/15/2019  Gregory Bryant, identified by name and date of birth, is a 44 y.o. male with a diagnosis of Diabetes: Type 2.   ASSESSMENT  There were no vitals taken for this visit. There is no height or weight on file to calculate BMI.   Pt states his blood sugar continues to increase and would like to know what he should be eating to help bring it back down.   Pt states over the last week he has made changes to his diet, including eliminating soda and sweet tea after having a sweet tea at cookout and saw what it did to his blood sugar. Pt also was eating toast & jelly, juice with breakfast, now having just eggs & bacon.  Pt states he has been on glipizide since his diagnosis 3 yrs ago and since glipizide loses effectiveness over time RD anticipates MD may discuss other options when pt sees her next month.  Sometimes pt feels nauseated in the morning, but not in the evening. Takes Zofran which resolves symptoms. Patient reports he takes both metformin and Glipizide in the morning, either can cause nausea.   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 Yes    What type of meal plan do you follow? less bread, quit drinking some juice    Are you taking your medications as prescribed? Yes   metformin, glipezide   Date Diagnosed 2019      Health Coping   How would you rate your overall health? Poor      Psychosocial Assessment   Patient Belief/Attitude about Diabetes Afraid    How often do you need to have someone help you when you read instructions, pamphlets, or other written materials from your doctor or pharmacy? 3 - Sometimes    What is the last grade level you  completed in school? 9      Complications   Last HgB A1C per patient/outside source --   fructosamine 522   How often do you check your blood sugar? 3-4 times/day    Fasting Blood glucose range (mg/dL) --   145, was in the 200s   Number of hypoglycemic episodes per month 0    Have you had a dilated eye exam in the past 12 months? Yes    Have you had a dental exam in the past 12 months? No    Are you checking your feet? Yes    How many days per week are you checking your feet? 7      Dietary Intake   Breakfast eggs, bacon    Snack (morning) chips (lays or doritos)    Lunch 1 or 2 sandwich lettuce, tomato, onion, lunch meat    Dinner fried chicken, mac & cheese, green beans    Snack (evening) cookies, coffee cake,    Beverage(s) galon water, 2 bottle juice      Exercise   Exercise Type Light (walking / raking leaves)    How many days per week to you exercise? 7    How many minutes per day do you exercise? 20    Total minutes per week of exercise 140  Patient Education   Previous Diabetes Education No    Nutrition management  Role of diet in the treatment of diabetes and the relationship between the three main macronutrients and blood glucose level    Physical activity and exercise  Role of exercise on diabetes management, blood pressure control and cardiac health.    Medications Reviewed patients medication for diabetes, action, purpose, timing of dose and side effects.    Monitoring Other (comment)   yearly dental exam     Individualized Goals (developed by patient)   Nutrition General guidelines for healthy choices and portions discussed    Physical Activity Exercise 5-7 days per week    Medications take my medication as prescribed      Outcomes   Expected Outcomes Demonstrated interest in learning. Expect positive outcomes    Future DMSE 4-6 wks    Program Status Not Completed           Individualized Plan for Diabetes Self-Management Training:   Learning  Objective:  Patient will have a greater understanding of diabetes self-management. Patient education plan is to attend individual and/or group sessions per assessed needs and concerns.   Plan:   Patient Instructions  Find a dentist who you can see Physical activity: continue daily walking increase to moderate intensity, stretching 5 min daily, look into resistance exercises that will be appropriate for you limitations (shoulders). Diet: create balanced meals and snacks. Come up with better evening snacks.   Expected Outcomes:  Demonstrated interest in learning. Expect positive outcomes  Education material provided: [employer], Snack Sheet, MyPlate, List of local dentists  If problems or questions, patient to contact team via:  MyChart and phone  Future DSME appointment: 4-6 wks

## 2019-08-15 NOTE — Patient Instructions (Signed)
Find a dentist who you can see Physical activity: continue daily walking increase to moderate intensity, stretching 5 min daily, look into resistance exercises that will be appropriate for you limitations (shoulders). Diet: create balanced meals and snacks. Come up with better evening snacks.

## 2019-09-13 ENCOUNTER — Telehealth: Payer: Self-pay | Admitting: Family Medicine

## 2019-09-14 ENCOUNTER — Other Ambulatory Visit: Payer: Self-pay | Admitting: Internal Medicine

## 2019-09-14 DIAGNOSIS — D5700 Hb-SS disease with crisis, unspecified: Secondary | ICD-10-CM

## 2019-09-14 MED ORDER — OXYCODONE HCL 15 MG PO TABS: 15.0000 mg | ORAL_TABLET | ORAL | 0 refills | Status: DC | PRN

## 2019-09-14 NOTE — Telephone Encounter (Signed)
Refilled

## 2019-09-26 ENCOUNTER — Encounter: Payer: Self-pay | Attending: Family Medicine | Admitting: Registered"

## 2019-09-26 DIAGNOSIS — E1165 Type 2 diabetes mellitus with hyperglycemia: Secondary | ICD-10-CM | POA: Insufficient documentation

## 2019-10-11 ENCOUNTER — Other Ambulatory Visit: Payer: Self-pay

## 2019-10-11 ENCOUNTER — Encounter: Payer: Self-pay | Admitting: Family Medicine

## 2019-10-11 ENCOUNTER — Ambulatory Visit (INDEPENDENT_AMBULATORY_CARE_PROVIDER_SITE_OTHER): Payer: Self-pay | Admitting: Family Medicine

## 2019-10-11 VITALS — BP 132/80 | HR 64 | Temp 97.3°F | Ht 66.0 in | Wt 167.0 lb

## 2019-10-11 DIAGNOSIS — E119 Type 2 diabetes mellitus without complications: Secondary | ICD-10-CM

## 2019-10-11 DIAGNOSIS — Z1159 Encounter for screening for other viral diseases: Secondary | ICD-10-CM

## 2019-10-11 DIAGNOSIS — E559 Vitamin D deficiency, unspecified: Secondary | ICD-10-CM

## 2019-10-11 DIAGNOSIS — D5700 Hb-SS disease with crisis, unspecified: Secondary | ICD-10-CM

## 2019-10-11 LAB — POCT URINALYSIS DIP (CLINITEK)
Bilirubin, UA: NEGATIVE
Blood, UA: NEGATIVE
Glucose, UA: NEGATIVE mg/dL
Ketones, POC UA: NEGATIVE mg/dL
Leukocytes, UA: NEGATIVE
Nitrite, UA: NEGATIVE
POC PROTEIN,UA: NEGATIVE
Spec Grav, UA: 1.01 (ref 1.010–1.025)
Urobilinogen, UA: 1 E.U./dL
pH, UA: 5.5 (ref 5.0–8.0)

## 2019-10-11 LAB — GLUCOSE, POCT (MANUAL RESULT ENTRY): POC Glucose: 124 mg/dl — AB (ref 70–99)

## 2019-10-11 MED ORDER — GLIPIZIDE 10 MG PO TABS: 10.0000 mg | ORAL_TABLET | Freq: Every day | ORAL | 5 refills | Status: DC

## 2019-10-11 MED ORDER — VITAMIN D (ERGOCALCIFEROL) 1.25 MG (50000 UNIT) PO CAPS: 50000.0000 [IU] | ORAL_CAPSULE | ORAL | 1 refills | Status: DC

## 2019-10-11 MED ORDER — OXYCODONE HCL 15 MG PO TABS: 15.0000 mg | ORAL_TABLET | ORAL | 0 refills | Status: DC | PRN

## 2019-10-11 MED ORDER — FOLIC ACID 1 MG PO TABS: 1.0000 mg | ORAL_TABLET | Freq: Every day | ORAL | 11 refills | Status: DC

## 2019-10-11 MED ORDER — METFORMIN HCL 1000 MG PO TABS: 1000.0000 mg | ORAL_TABLET | Freq: Two times a day (BID) | ORAL | 2 refills | Status: DC

## 2019-10-11 NOTE — Progress Notes (Signed)
[facility]   Established Patient Office Visit  Subjective:  Patient ID: Gregory Bryant, male    DOB: [birth year]  Age: 44 y.o. MRN: [id number]  CC:  Chief Complaint  Patient presents with  . Follow-up    HPI Gregory Bryant is a very pleasant 44 year old male with a medical history significant for sickle cell disease, type 2 diabetes mellitus, chronic pain syndrome, and history of anemia of chronic disease presents for 43-month follow-up of chronic conditions.  Patient says that he has been doing well and does not have any new complaints on today.  He has been taking all medications consistently.  Patient continues to follow with hematologist around every 6 months.  He is on hydroxyurea as a disease modifying agent.  He has well-controlled sickle cell disease with infrequent pain crises.  He typically has pain to lower back on most days that is relieved by oxycodone. Patient also has a history of type 2 diabetes mellitus.  He checks blood glucose consistently.  Average glucose in a.m. fasting is around 140.  Patient's most recent fructosamine level was 522.  He denies any dizziness, blurred vision, polyuria, polydipsia, or polyphagia.  Patient's last eye exam was in January 2021.  He checks feet daily.  He mostly follows a carbohydrate modified diet.  He does not exercise due to chronic pain, but tries to stay active.  Past Medical History:  Diagnosis Date  . Diabetes mellitus without complication ([place])   . Sickle cell anemia (HCC)     Past Surgical History:  Procedure Laterality Date  . CHOLECYSTECTOMY      Family History  Problem Relation Age of Onset  . Diabetes Mother     Social History   Socioeconomic History  . Marital status: Divorced    Spouse name: Not on file  . Number of children: Not on file  . Years of education: Not on file  . Highest education level: Not on file  Occupational History  . Not on file    Tobacco Use  . Smoking status: Never Smoker  . Smokeless tobacco: Never Used  Vaping Use  . Vaping Use: Never used  Substance and Sexual Activity  . Alcohol use: No  . Drug use: No  . Sexual activity: Not on file  Other Topics Concern  . Not on file  Social History Narrative  . Not on file   Social Determinants of Health   Financial Resource Strain:   . Difficulty of Paying Living Expenses: Not on file  Food Insecurity:   . Worried About Charity fundraiser in the Last Year: Not on file  . Ran Out of Food in the Last Year: Not on file  Transportation Needs:   . Lack of Transportation (Medical): Not on file  . Lack of Transportation (Non-Medical): Not on file  Physical Activity:   . Days of Exercise per Week: Not on file  . Minutes of Exercise per Session: Not on file  Stress:   . Feeling of Stress : Not on file  Social Connections:   . Frequency of Communication with Friends and Family: Not on file  . Frequency of Social Gatherings with Friends and Family: Not on file  . Attends Religious Services: Not on file  . Active Member of Clubs or Organizations: Not on file  . Attends Archivist Meetings: Not on file  . Marital Status: Not on file  Intimate Partner Violence:   .  Fear of Current or Ex-Partner: Not on file  . Emotionally Abused: Not on file  . Physically Abused: Not on file  . Sexually Abused: Not on file    Outpatient Medications Prior to Visit  Medication Sig Dispense Refill  . cetirizine (ZYRTEC) 10 MG tablet Take 1 tablet (10 mg total) by mouth daily. 30 tablet 11  . fluticasone (FLONASE) 50 MCG/ACT nasal spray Place 2 sprays into both nostrils daily. 16 g 6  . ibuprofen (ADVIL,MOTRIN) 600 MG tablet Take 1 tablet (600 mg total) by mouth every 8 (eight) hours as needed. 30 tablet 2  . meloxicam (MOBIC) 15 MG tablet Take 1 tablet (15 mg total) by mouth daily. 14 tablet 0  . ondansetron (ZOFRAN ODT) 8 MG disintegrating tablet Take 1 tablet (8 mg  total) by mouth every 8 (eight) hours as needed for nausea or vomiting. 20 tablet 0  . folic acid (FOLVITE) 1 MG tablet Take 1 tablet (1 mg total) by mouth daily. 30 tablet 11  . glipiZIDE (GLUCOTROL) 10 MG tablet Take 1 tablet (10 mg total) by mouth daily before breakfast. 30 tablet 5  . Vitamin D, Ergocalciferol, (DRISDOL) 1.25 MG (50000 UNIT) CAPS capsule Take 1 capsule (50,000 Units total) by mouth every 7 (seven) days. 30 capsule 1  . metFORMIN (GLUCOPHAGE) 1000 MG tablet Take 1 tablet (1,000 mg total) by mouth 2 (two) times daily with a meal. 180 tablet 2  . oxyCODONE (ROXICODONE) 15 MG immediate release tablet Take 1 tablet (15 mg total) by mouth every 4 (four) hours as needed for up to 15 days for pain. 60 tablet 0   No facility-administered medications prior to visit.    Allergies  Allergen Reactions  . Aspirin Other (See Comments)    unknown  . Sulfa Antibiotics Other (See Comments)    unknown    ROS Review of Systems  Constitutional: Negative for fatigue and fever.  HENT: Negative.   Eyes: Negative.  Negative for visual disturbance.  Respiratory: Negative.   Cardiovascular: Negative.   Gastrointestinal: Negative.   Endocrine: Negative for polydipsia, polyphagia and polyuria.  Genitourinary: Negative.   Musculoskeletal: Positive for myalgias.  Allergic/Immunologic: Negative.   Neurological: Negative.   Psychiatric/Behavioral: Negative.       Objective:    Physical Exam Constitutional:      Appearance: Normal appearance.  Eyes:     Pupils: Pupils are equal, round, and reactive to light.  Cardiovascular:     Rate and Rhythm: Normal rate and regular rhythm.     Pulses: Normal pulses.  Pulmonary:     Effort: Pulmonary effort is normal.  Abdominal:     General: Abdomen is flat. Bowel sounds are normal.  Musculoskeletal:        General: Normal range of motion.     Cervical back: Normal range of motion.  Skin:    General: Skin is warm.  Neurological:      General: No focal deficit present.     Mental Status: He is alert. Mental status is at baseline.  Psychiatric:        Mood and Affect: Mood normal.        Behavior: Behavior normal.        Thought Content: Thought content normal.        Judgment: Judgment normal.     BP 132/80   Pulse 64   Temp (!) 97.3 F (36.3 C) (Temporal)   Ht 5\' 6"  (1.676 m)   Wt 167 lb (75.8  kg)   SpO2 99%   BMI 26.95 kg/m  Wt Readings from Last 3 Encounters:  10/11/19 167 lb (75.8 kg)  07/05/19 170 lb 3.2 oz (77.2 kg)  05/01/19 174 lb (78.9 kg)     Health Maintenance Due  Topic Date Due  . Hepatitis C Screening  Never done  . Meningococcal B Vaccine (1 of 4 - Increased Risk Bexsero 2-dose series) Never done  . INFLUENZA VACCINE  08/28/2019  . OPHTHALMOLOGY EXAM  09/30/2019  . URINE MICROALBUMIN  10/08/2019    There are no preventive care reminders to display for this patient.  No results found for: TSH Lab Results  Component Value Date   WBC 17.2 (H) 07/05/2019   HGB 12.9 (L) 07/05/2019   HCT 38.1 07/05/2019   MCV 84 07/05/2019   PLT 404 07/05/2019   Lab Results  Component Value Date   NA 133 (L) 07/05/2019   K 4.4 07/05/2019   CO2 22 07/05/2019   GLUCOSE 175 (H) 07/05/2019   BUN 8 07/05/2019   CREATININE 0.94 07/05/2019   BILITOT 2.5 (H) 07/05/2019   ALKPHOS 115 07/05/2019   AST 29 07/05/2019   ALT 26 07/05/2019   PROT 7.7 07/05/2019   ALBUMIN 4.4 07/05/2019   CALCIUM 9.9 07/05/2019   ANIONGAP 8 05/01/2019   No results found for: CHOL No results found for: HDL No results found for: LDLCALC No results found for: TRIG No results found for: CHOLHDL Lab Results  Component Value Date   HGBA1C 5.2 04/07/2018      Assessment & Plan:   Problem List Items Addressed This Visit      Endocrine   Diabetes ([place])   Relevant Medications   metFORMIN (GLUCOPHAGE) 1000 MG tablet   glipiZIDE (GLUCOTROL) 10 MG tablet   Other Relevant Orders   Fructosamine   Glucose (CBG)      Other   Sickle cell anemia with pain (HCC) - Primary   Relevant Medications   folic acid (FOLVITE) 1 MG tablet   Vitamin D, Ergocalciferol, (DRISDOL) 1.25 MG (50000 UNIT) CAPS capsule   oxyCODONE (ROXICODONE) 15 MG immediate release tablet   Other Relevant Orders   POCT URINALYSIS DIP (CLINITEK) (Completed)   Sickle Cell Panel    Other Visit Diagnoses    Need for hepatitis C screening test       Relevant Orders   Hepatitis C Antibody   Vitamin D deficiency       Relevant Orders   Sickle Cell Panel      Meds ordered this encounter  Medications  . folic acid (FOLVITE) 1 MG tablet    Sig: Take 1 tablet (1 mg total) by mouth daily.    Dispense:  30 tablet    Refill:  11    Order Specific Question:   Supervising Provider    Answer:   Tresa Garter [id number]  . Vitamin D, Ergocalciferol, (DRISDOL) 1.25 MG (50000 UNIT) CAPS capsule    Sig: Take 1 capsule (50,000 Units total) by mouth every 7 (seven) days.    Dispense:  30 capsule    Refill:  1    Order Specific Question:   Supervising Provider    Answer:   Tresa Garter [id number]  . metFORMIN (GLUCOPHAGE) 1000 MG tablet    Sig: Take 1 tablet (1,000 mg total) by mouth 2 (two) times daily with a meal.    Dispense:  180 tablet    Refill:  2    Order Specific  Question:   Supervising Provider    Answer:   Tresa Garter [[id number]]  . glipiZIDE (GLUCOTROL) 10 MG tablet    Sig: Take 1 tablet (10 mg total) by mouth daily before breakfast.    Dispense:  30 tablet    Refill:  5    Order Specific Question:   Supervising Provider    Answer:   Tresa Garter [id number]  . oxyCODONE (ROXICODONE) 15 MG immediate release tablet    Sig: Take 1 tablet (15 mg total) by mouth every 4 (four) hours as needed for up to 15 days for pain.    Dispense:  60 tablet    Refill:  0    Order Specific Question:   Supervising Provider    Answer:   Tresa Garter [id number]   Sickle cell anemia with pain ([place]) - POCT  URINALYSIS DIP (CLINITEK) - folic acid (FOLVITE) 1 MG tablet; Take 1 tablet (1 mg total) by mouth daily.  Dispense: 30 tablet; Refill: 11 - Vitamin D, Ergocalciferol, (DRISDOL) 1.25 MG (50000 UNIT) CAPS capsule; Take 1 capsule (50,000 Units total) by mouth every 7 (seven) days.  Dispense: 30 capsule; Refill: 1 - Sickle Cell Panel - oxyCODONE (ROXICODONE) 15 MG immediate release tablet; Take 1 tablet (15 mg total) by mouth every 4 (four) hours as needed for up to 15 days for pain.  Dispense: 60 tablet; Refill: 0  Type 2 diabetes mellitus without complication, without long-term current use of insulin ([place]) The patient is asked to make an attempt to improve diet and exercise patterns to aid in medical management of this problem.  - metFORMIN (GLUCOPHAGE) 1000 MG tablet; Take 1 tablet (1,000 mg total) by mouth 2 (two) times daily with a meal.  Dispense: 180 tablet; Refill: 2 - glipiZIDE (GLUCOTROL) 10 MG tablet; Take 1 tablet (10 mg total) by mouth daily before breakfast.  Dispense: 30 tablet; Refill: 5 - Fructosamine - Glucose (CBG) - Microalbumin/Creatinine Ratio, Urine  Need for hepatitis C screening test - Hepatitis C Antibody  Vitamin D deficiency - Sickle Cell Panel    Follow-up: Return in about 3 months (around 01/10/2020) for diabetes.    Donia Pounds  APRN, MSN, FNP-C [facility] [address] [place], [place] [address] [phone]

## 2019-10-11 NOTE — Patient Instructions (Signed)

## 2019-10-12 LAB — CMP14+CBC/D/PLT+FER+RETIC+V...
ALT: 35 IU/L (ref 0–44)
AST: 30 IU/L (ref 0–40)
Albumin/Globulin Ratio: 1.2 (ref 1.2–2.2)
Albumin: 4.5 g/dL (ref 4.0–5.0)
Alkaline Phosphatase: 112 IU/L (ref 44–121)
BUN/Creatinine Ratio: 12 (ref 9–20)
BUN: 10 mg/dL (ref 6–24)
Basophils Absolute: 0.2 10*3/uL (ref 0.0–0.2)
Basos: 1 %
Bilirubin Total: 3.4 mg/dL — ABNORMAL HIGH (ref 0.0–1.2)
CO2: 20 mmol/L (ref 20–29)
Calcium: 10.2 mg/dL (ref 8.7–10.2)
Chloride: 100 mmol/L (ref 96–106)
Creatinine, Ser: 0.84 mg/dL (ref 0.76–1.27)
EOS (ABSOLUTE): 1.4 10*3/uL — ABNORMAL HIGH (ref 0.0–0.4)
Eos: 9 %
Ferritin: 837 ng/mL — ABNORMAL HIGH (ref 30–400)
GFR calc Af Amer: 123 mL/min/{1.73_m2} (ref >59)
GFR calc non Af Amer: 106 mL/min/{1.73_m2} (ref >59)
Globulin, Total: 3.8 g/dL (ref 1.5–4.5)
Glucose: 121 mg/dL — ABNORMAL HIGH (ref 65–99)
Hematocrit: 43 % (ref 37.5–51.0)
Hemoglobin: 14.1 g/dL (ref 13.0–17.7)
Immature Grans (Abs): 0.2 10*3/uL — ABNORMAL HIGH (ref 0.0–0.1)
Immature Granulocytes: 2 %
Lymphocytes Absolute: 4.3 10*3/uL — ABNORMAL HIGH (ref 0.7–3.1)
Lymphs: 29 %
MCH: 28.7 pg (ref 26.6–33.0)
MCHC: 32.8 g/dL (ref 31.5–35.7)
MCV: 88 fL (ref 79–97)
Monocytes Absolute: 1 10*3/uL — ABNORMAL HIGH (ref 0.1–0.9)
Monocytes: 7 %
NRBC: 2 % — ABNORMAL HIGH (ref 0–0)
Neutrophils Absolute: 7.9 10*3/uL — ABNORMAL HIGH (ref 1.4–7.0)
Neutrophils: 52 %
Platelets: 422 10*3/uL (ref 150–450)
Potassium: 4.7 mmol/L (ref 3.5–5.2)
RBC: 4.91 x10E6/uL (ref 4.14–5.80)
RDW: 15 % (ref 11.6–15.4)
Retic Ct Pct: 7.1 % — ABNORMAL HIGH (ref 0.6–2.6)
Sodium: 138 mmol/L (ref 134–144)
Total Protein: 8.3 g/dL (ref 6.0–8.5)
Vit D, 25-Hydroxy: 41.2 ng/mL (ref 30.0–100.0)
WBC: 15.1 10*3/uL — ABNORMAL HIGH (ref 3.4–10.8)

## 2019-10-12 LAB — HEPATITIS C ANTIBODY: Hep C Virus Ab: <0.1 s/co ratio (ref 0.0–0.9)

## 2019-10-12 LAB — FRUCTOSAMINE: Fructosamine: 453 umol/L — ABNORMAL HIGH (ref 0–285)

## 2019-10-12 LAB — MICROALBUMIN / CREATININE URINE RATIO
Creatinine, Urine: 55.3 mg/dL
Microalb/Creat Ratio: 11 mg/g creat (ref 0–29)
Microalbumin, Urine: 6 ug/mL

## 2019-10-17 ENCOUNTER — Telehealth: Payer: Self-pay

## 2019-10-17 NOTE — Telephone Encounter (Signed)
-----   Message from Dorena Dew, [place] sent at 10/13/2019  3:31 PM EDT ----- Regarding: lab resuls Please inform patient that all laboratory values are consistent with his baseline.  No medication changes warranted at this time.  Continue a low carbohydrate diet divided over small meals throughout the day.  Follow-up in clinic as scheduled.   Donia Pounds  APRN, MSN, FNP-C [facility] [address] [place], [place] [address] [phone]

## 2019-10-17 NOTE — Telephone Encounter (Signed)
Called patient discussed resutls

## 2019-11-29 ENCOUNTER — Encounter (HOSPITAL_COMMUNITY): Payer: Self-pay

## 2019-11-29 ENCOUNTER — Other Ambulatory Visit: Payer: Self-pay

## 2019-11-29 ENCOUNTER — Emergency Department (HOSPITAL_COMMUNITY)
Admission: EM | Admit: 2019-11-29 | Discharge: 2019-11-30 | Disposition: A | Discharge status: Home / Self Care | Payer: Medicaid Other | Attending: Emergency Medicine | Admitting: Emergency Medicine

## 2019-11-29 DIAGNOSIS — D5700 Hb-SS disease with crisis, unspecified: Secondary | ICD-10-CM

## 2019-11-29 DIAGNOSIS — D57219 Sickle-cell/Hb-C disease with crisis, unspecified: Secondary | ICD-10-CM | POA: Insufficient documentation

## 2019-11-29 DIAGNOSIS — Z7984 Long term (current) use of oral hypoglycemic drugs: Secondary | ICD-10-CM | POA: Insufficient documentation

## 2019-11-29 DIAGNOSIS — E1165 Type 2 diabetes mellitus with hyperglycemia: Secondary | ICD-10-CM | POA: Insufficient documentation

## 2019-11-29 LAB — COMPREHENSIVE METABOLIC PANEL
ALT: 36 U/L (ref 0–44)
AST: 46 U/L — ABNORMAL HIGH (ref 15–41)
Albumin: 4.5 g/dL (ref 3.5–5.0)
Alkaline Phosphatase: 99 U/L (ref 38–126)
Anion gap: 9 (ref 5–15)
BUN: 12 mg/dL (ref 6–20)
CO2: 21 mmol/L — ABNORMAL LOW (ref 22–32)
Calcium: 9.6 mg/dL (ref 8.9–10.3)
Chloride: 105 mmol/L (ref 98–111)
Creatinine, Ser: 0.71 mg/dL (ref 0.61–1.24)
GFR, Estimated: >60 mL/min (ref >60)
Glucose, Bld: 169 mg/dL — ABNORMAL HIGH (ref 70–99)
Potassium: 4.3 mmol/L (ref 3.5–5.1)
Sodium: 135 mmol/L (ref 135–145)
Total Bilirubin: 3.4 mg/dL — ABNORMAL HIGH (ref 0.3–1.2)
Total Protein: 8.6 g/dL — ABNORMAL HIGH (ref 6.5–8.1)

## 2019-11-29 LAB — RETICULOCYTES
Immature Retic Fract: 39.5 % — ABNORMAL HIGH (ref 2.3–15.9)
RBC.: 4.51 MIL/uL (ref 4.22–5.81)
Retic Count, Absolute: 64.9 10*3/uL (ref 19.0–186.0)
Retic Ct Pct: 7.6 % — ABNORMAL HIGH (ref 0.4–3.1)

## 2019-11-29 LAB — CBC WITH DIFFERENTIAL/PLATELET
Abs Immature Granulocytes: 0.93 10*3/uL — ABNORMAL HIGH (ref 0.00–0.07)
Basophils Absolute: 0.2 10*3/uL — ABNORMAL HIGH (ref 0.0–0.1)
Basophils Relative: 1 %
Eosinophils Absolute: 1.2 10*3/uL — ABNORMAL HIGH (ref 0.0–0.5)
Eosinophils Relative: 5 %
HCT: 36.9 % — ABNORMAL LOW (ref 39.0–52.0)
Hemoglobin: 13.2 g/dL (ref 13.0–17.0)
Immature Granulocytes: 4 %
Lymphocytes Relative: 15 %
Lymphs Abs: 3.6 10*3/uL (ref 0.7–4.0)
MCH: 29.6 pg (ref 26.0–34.0)
MCHC: 35.8 g/dL (ref 30.0–36.0)
MCV: 82.7 fL (ref 80.0–100.0)
Monocytes Absolute: 1.6 10*3/uL — ABNORMAL HIGH (ref 0.1–1.0)
Monocytes Relative: 6 %
Neutro Abs: 16.8 10*3/uL — ABNORMAL HIGH (ref 1.7–7.7)
Neutrophils Relative %: 69 %
Platelets: 384 10*3/uL (ref 150–400)
RBC: 4.46 MIL/uL (ref 4.22–5.81)
RDW: 16 % — ABNORMAL HIGH (ref 11.5–15.5)
WBC: 24.3 10*3/uL — ABNORMAL HIGH (ref 4.0–10.5)
nRBC: 2.5 % — ABNORMAL HIGH (ref 0.0–0.2)

## 2019-11-29 MED ORDER — HYDROMORPHONE HCL 1 MG/ML IJ SOLN
1.0000 mg | INTRAMUSCULAR | Status: AC
  Administered 2019-11-29: 1 mg via INTRAVENOUS
  Filled 2019-11-29: qty 1

## 2019-11-29 MED ORDER — ONDANSETRON HCL 4 MG/2ML IJ SOLN
4.0000 mg | INTRAMUSCULAR | Status: DC | PRN
  Filled 2019-11-29: qty 2

## 2019-11-29 MED ORDER — SODIUM CHLORIDE 0.45 % IV SOLN: INTRAVENOUS | Status: DC

## 2019-11-29 MED ORDER — KETOROLAC TROMETHAMINE 15 MG/ML IJ SOLN
15.0000 mg | Freq: Once | INTRAMUSCULAR | Status: AC
  Administered 2019-11-29: 15 mg via INTRAVENOUS
  Filled 2019-11-29: qty 1

## 2019-11-29 NOTE — ED Triage Notes (Signed)
Pt scc starting around 0600 this morning. Pain worse in both arms and legs. Took oxycodone around 1600 today with no improvement.

## 2019-11-29 NOTE — ED Provider Notes (Signed)
[facility] DEPT Provider Note   CSN: [id number] Arrival date & time: 11/29/19  2120     History Chief Complaint  Patient presents with  . Sickle Cell Pain Crisis    Gregory Bryant is a 44 y.o. male.  The history is provided by the patient and medical records.  Sickle Cell Pain Crisis  Gregory Bryant is a 44 y.o. male who presents to the Emergency Department complaining of sickle cell pain.  He presents to the ED complaining of pain in bilateral arms, legs that started at 0600 today.  Pain is similar to prior pain crises.  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  Denies fevers, chest pain, sob, n/v/d, abdominal pain.  Has not been vaccinated for [place].  No known sick contacts.   He took oxy 15 and tylenol  today with no improvement in sxs.      Past Medical History:  Diagnosis Date  . Diabetes mellitus without complication ([place])   . Sickle cell anemia [facility])     Patient Active Problem List   Diagnosis Date Noted  . Uncontrolled type 2 diabetes mellitus with hyperglycemia ([place]) 08/15/2019  . Sickle cell pain crisis ([place]) 01/11/2018  . Sickle cell anemia with pain ([place]) 07/22/2017  . Chronic musculoskeletal pain 03/30/2017  . Avascular necrosis ([place]) 03/30/2017  . Asthma, mild intermittent 03/30/2017  . Diabetes ([place]) 02/07/2017  . 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 CHOLECYSTECTOMY         Family History  Problem Relation Age of Onset  . Diabetes Mother     Social History   Tobacco Use  . Smoking status: Never Smoker  . Smokeless tobacco: Never Used  Vaping Use  . Vaping Use: Never used  Substance Use Topics  . Alcohol use: No  . Drug use: No    Home Medications Prior to Admission medications   Medication Sig Start Date End Date Taking? Authorizing Provider  cetirizine (ZYRTEC) 10 MG tablet Take 1 tablet (10 mg total) by mouth  daily. 07/08/18  Yes Lanae Boast, FNP  fluticasone (FLONASE) 50 MCG/ACT nasal spray Place 2 sprays into both nostrils daily. 07/08/18  Yes Lanae Boast, FNP  folic acid (FOLVITE) 1 MG tablet Take 1 tablet (1 mg total) by mouth daily. 10/11/19  Yes Dorena Dew, FNP  glipiZIDE (GLUCOTROL) 10 MG tablet Take 1 tablet (10 mg total) by mouth daily before breakfast. 10/11/19  Yes Dorena Dew, FNP  ibuprofen (ADVIL,MOTRIN) 600 MG tablet Take 1 tablet (600 mg total) by mouth every 8 (eight) hours as needed. 04/07/18  Yes Lanae Boast, FNP  meloxicam (MOBIC) 15 MG tablet Take 1 tablet (15 mg total) by mouth daily. 04/13/19  Yes Magnant, Charles L, PA-C  metFORMIN (GLUCOPHAGE) 1000 MG tablet Take 1 tablet (1,000 mg total) by mouth 2 (two) times daily with a meal. 10/11/19 01/09/20 Yes Dorena Dew, FNP  ondansetron (ZOFRAN ODT) 8 MG disintegrating tablet Take 1 tablet (8 mg total) by mouth every 8 (eight) hours as needed for nausea or vomiting. 04/07/18  Yes Lanae Boast, FNP  oxyCODONE (ROXICODONE) 15 MG immediate release tablet Take 1 tablet (15 mg total) by mouth every 4 (four) hours as needed for up to 15 days for pain. 10/11/19 11/29/19 Yes Dorena Dew, FNP  Vitamin D, Ergocalciferol, (DRISDOL) 1.25 MG (50000 UNIT) CAPS capsule Take 1 capsule (50,000 Units total) by mouth every 7 (  seven) days. 10/11/19  Yes Dorena Dew, FNP    Allergies    Aspirin and Sulfa antibiotics  Review of Systems   Review of Systems  All other systems reviewed and are negative.   Physical Exam Updated Vital Signs BP (!) 153/98   Pulse 95   Temp 99.2 F (37.3 C) (Oral)   Resp 18   Ht 5\' 6"  (1.676 m)   Wt 79.4 kg   SpO2 95%   BMI 28.25 kg/m   Physical Exam Vitals and nursing note reviewed.  Constitutional:      Appearance: He is well-developed.     Comments: Uncomfortable appearing  HENT:     Head: Normocephalic and atraumatic.  Cardiovascular:     Rate and Rhythm: Normal rate and  regular rhythm.     Heart sounds: No murmur heard.   Pulmonary:     Effort: Pulmonary effort is normal. No respiratory distress.     Breath sounds: Normal breath sounds.  Abdominal:     Palpations: Abdomen is soft.     Tenderness: There is no abdominal tenderness. There is no guarding or rebound.  Musculoskeletal:     Comments: TTP over arms, legs, compartments soft.   Skin:    General: Skin is warm and dry.  Neurological:     Mental Status: He is alert and oriented to person, place, and time.  Psychiatric:        Behavior: Behavior normal.     ED Results / Procedures / Treatments   Labs (all labs ordered are listed, but only abnormal results are displayed) Labs Reviewed  COMPREHENSIVE METABOLIC PANEL - Abnormal; Notable for the following components:      Result Value   CO2 21 (*)    Glucose, Bld 169 (*)    Total Protein 8.6 (*)    AST 46 (*)    Total Bilirubin 3.4 (*)    All other components within normal limits  CBC WITH DIFFERENTIAL/PLATELET - Abnormal; Notable for the following components:   WBC 24.3 (*)    HCT 36.9 (*)    RDW 16.0 (*)    nRBC 2.5 (*)    Neutro Abs 16.8 (*)    Monocytes Absolute 1.6 (*)    Eosinophils Absolute 1.2 (*)    Basophils Absolute 0.2 (*)    Abs Immature Granulocytes 0.93 (*)    All other components within normal limits  RETICULOCYTES - Abnormal; Notable for the following components:   Retic Ct Pct 7.6 (*)    Immature Retic Fract 39.5 (*)    All other components within normal limits    EKG None  Radiology No results found.  Procedures Procedures (including critical care time)  Medications Ordered in ED Medications  0.45 % sodium chloride infusion ( Intravenous New Bag/Given 11/29/19 2230)  ondansetron (ZOFRAN) injection 4 mg (has no administration in time range)  HYDROmorphone (DILAUDID) injection 1 mg (1 mg Intravenous Given 11/29/19 2219)  HYDROmorphone (DILAUDID) injection 1 mg (1 mg Intravenous Given 11/29/19 2306)    ketorolac (TORADOL) 15 MG/ML injection 15 mg (15 mg Intravenous Given 11/29/19 2340)    ED Course  I have reviewed the triage vital signs and the nursing notes.  Pertinent labs & imaging results that were available during my care of the patient were reviewed by me and considered in my medical decision making (see chart for details).    MDM Rules/Calculators/A&P  patient with history of sickle cell anemia here for pain consistent with prior pain crises after starting a new job. CBC with leukocytosis, similar when compared to priors, normal hemoglobin. Pain improved after treatment in the emergency department. Plan to discharge home with outpatient follow-up as well as return precautions.  Final Clinical Impression(s) / ED Diagnoses Final diagnoses:  Sickle cell pain crisis [facility])    Rx / DC Orders ED Discharge Orders    None       Quintella Reichert, MD 11/30/19 0012

## 2019-11-30 ENCOUNTER — Emergency Department (HOSPITAL_COMMUNITY): Payer: Self-pay

## 2019-11-30 ENCOUNTER — Encounter (HOSPITAL_COMMUNITY): Payer: Self-pay

## 2019-11-30 ENCOUNTER — Other Ambulatory Visit: Payer: Self-pay

## 2019-11-30 ENCOUNTER — Emergency Department (HOSPITAL_COMMUNITY)
Admission: EM | Admit: 2019-11-30 | Discharge: 2019-11-30 | Disposition: A | Discharge status: Home / Self Care | Payer: Self-pay | Attending: Emergency Medicine | Admitting: Emergency Medicine

## 2019-11-30 DIAGNOSIS — E1165 Type 2 diabetes mellitus with hyperglycemia: Secondary | ICD-10-CM | POA: Insufficient documentation

## 2019-11-30 DIAGNOSIS — D57219 Sickle-cell/Hb-C disease with crisis, unspecified: Secondary | ICD-10-CM | POA: Insufficient documentation

## 2019-11-30 DIAGNOSIS — Z7951 Long term (current) use of inhaled steroids: Secondary | ICD-10-CM | POA: Insufficient documentation

## 2019-11-30 DIAGNOSIS — Z7984 Long term (current) use of oral hypoglycemic drugs: Secondary | ICD-10-CM | POA: Insufficient documentation

## 2019-11-30 DIAGNOSIS — D5700 Hb-SS disease with crisis, unspecified: Secondary | ICD-10-CM

## 2019-11-30 DIAGNOSIS — R000 Tachycardia, unspecified: Secondary | ICD-10-CM | POA: Insufficient documentation

## 2019-11-30 DIAGNOSIS — J4520 Mild intermittent asthma, uncomplicated: Secondary | ICD-10-CM | POA: Insufficient documentation

## 2019-11-30 DIAGNOSIS — Z20822 Contact with and (suspected) exposure to covid-19: Secondary | ICD-10-CM | POA: Insufficient documentation

## 2019-11-30 LAB — CBC WITH DIFFERENTIAL/PLATELET
Abs Immature Granulocytes: 0.54 10*3/uL — ABNORMAL HIGH (ref 0.00–0.07)
Basophils Absolute: 0.1 10*3/uL (ref 0.0–0.1)
Basophils Relative: 1 %
Eosinophils Absolute: 0 10*3/uL (ref 0.0–0.5)
Eosinophils Relative: 0 %
HCT: 35.9 % — ABNORMAL LOW (ref 39.0–52.0)
Hemoglobin: 13.1 g/dL (ref 13.0–17.0)
Immature Granulocytes: 3 %
Lymphocytes Relative: 12 %
Lymphs Abs: 2.1 10*3/uL (ref 0.7–4.0)
MCH: 29.6 pg (ref 26.0–34.0)
MCHC: 36.5 g/dL — ABNORMAL HIGH (ref 30.0–36.0)
MCV: 81 fL (ref 80.0–100.0)
Monocytes Absolute: 1.3 10*3/uL — ABNORMAL HIGH (ref 0.1–1.0)
Monocytes Relative: 7 %
Neutro Abs: 13.9 10*3/uL — ABNORMAL HIGH (ref 1.7–7.7)
Neutrophils Relative %: 77 %
Platelets: 362 10*3/uL (ref 150–400)
RBC: 4.43 MIL/uL (ref 4.22–5.81)
RDW: 15.5 % (ref 11.5–15.5)
WBC: 17.9 10*3/uL — ABNORMAL HIGH (ref 4.0–10.5)
nRBC: 4.4 % — ABNORMAL HIGH (ref 0.0–0.2)

## 2019-11-30 LAB — COMPREHENSIVE METABOLIC PANEL
ALT: 37 U/L (ref 0–44)
AST: 38 U/L (ref 15–41)
Albumin: 4.6 g/dL (ref 3.5–5.0)
Alkaline Phosphatase: 92 U/L (ref 38–126)
Anion gap: 10 (ref 5–15)
BUN: 8 mg/dL (ref 6–20)
CO2: 21 mmol/L — ABNORMAL LOW (ref 22–32)
Calcium: 9.5 mg/dL (ref 8.9–10.3)
Chloride: 103 mmol/L (ref 98–111)
Creatinine, Ser: 0.71 mg/dL (ref 0.61–1.24)
GFR, Estimated: >60 mL/min (ref >60)
Glucose, Bld: 177 mg/dL — ABNORMAL HIGH (ref 70–99)
Potassium: 3.6 mmol/L (ref 3.5–5.1)
Sodium: 134 mmol/L — ABNORMAL LOW (ref 135–145)
Total Bilirubin: 4.2 mg/dL — ABNORMAL HIGH (ref 0.3–1.2)
Total Protein: 8.6 g/dL — ABNORMAL HIGH (ref 6.5–8.1)

## 2019-11-30 LAB — RETICULOCYTES
Immature Retic Fract: 41.4 % — ABNORMAL HIGH (ref 2.3–15.9)
RBC.: 4.43 MIL/uL (ref 4.22–5.81)
Retic Count, Absolute: 276 10*3/uL — ABNORMAL HIGH (ref 19.0–186.0)
Retic Ct Pct: 6.6 % — ABNORMAL HIGH (ref 0.4–3.1)

## 2019-11-30 LAB — RESPIRATORY PANEL BY RT PCR (FLU A&B, COVID)
Influenza A by PCR: NEGATIVE
Influenza B by PCR: NEGATIVE
SARS Coronavirus 2 by RT PCR: NEGATIVE

## 2019-11-30 MED ORDER — SODIUM CHLORIDE 0.45 % IV SOLN: INTRAVENOUS | Status: DC

## 2019-11-30 MED ORDER — DIPHENHYDRAMINE HCL 25 MG PO CAPS
25.0000 mg | ORAL_CAPSULE | ORAL | Status: DC | PRN
  Administered 2019-11-30: 25 mg via ORAL
  Filled 2019-11-30: qty 1

## 2019-11-30 MED ORDER — HYDROMORPHONE HCL 1 MG/ML IJ SOLN
1.0000 mg | INTRAMUSCULAR | Status: AC
  Administered 2019-11-30: 1 mg via INTRAVENOUS
  Filled 2019-11-30: qty 1

## 2019-11-30 MED ORDER — HYDROMORPHONE HCL 1 MG/ML IJ SOLN
0.5000 mg | INTRAMUSCULAR | Status: AC
  Administered 2019-11-30: 0.5 mg via INTRAVENOUS
  Filled 2019-11-30: qty 1

## 2019-11-30 MED ORDER — ONDANSETRON HCL 4 MG/2ML IJ SOLN
4.0000 mg | INTRAMUSCULAR | Status: DC | PRN
  Administered 2019-11-30: 4 mg via INTRAVENOUS
  Filled 2019-11-30: qty 2

## 2019-11-30 MED ORDER — KETOROLAC TROMETHAMINE 15 MG/ML IJ SOLN
15.0000 mg | INTRAMUSCULAR | Status: AC
  Administered 2019-11-30: 15 mg via INTRAVENOUS
  Filled 2019-11-30: qty 1

## 2019-11-30 NOTE — Discharge Instructions (Signed)
Please follow-up with the sickle cell clinic tomorrow for further managements of your condition.

## 2019-11-30 NOTE — ED Triage Notes (Signed)
Patient c/o sickle cell pain in both arms. Patient states he took Oxycodone at 0600 today.

## 2019-11-30 NOTE — ED Provider Notes (Signed)
[facility] DEPT Provider Note   CSN: [id number] Arrival date & time: 11/30/19  1151     History Chief Complaint  Patient presents with  . Sickle Cell Pain Crisis    Gregory Bryant is a 43 y.o. male.  The history is provided by the patient and medical records. No language interpreter was used.  Sickle Cell Pain Crisis     44 year old male significant history of sickle cell disease, diabetes, presenting complaining of sickle cell related pain.  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  After the job he developed pain to both of his arms and legs.  Pain is sharp achy throbbing felt similar to prior sickle cell crisis.  No associated  runny nose sneezing coughing sore throat chest pain shortness of breath abdominal pain back pain or dysuria.  He was seen in the ED yesterday for his complaint and received treatment for sickle cell pain.  He did report some improvement of his symptoms but once he got home, his pain returns and now he is spiking a fever.  He has not been vaccinated for COVID-19.  He denies any recent sick contact.  He felt his pain is likely due to his recent new job.   Past Medical History:  Diagnosis Date  . Diabetes mellitus without complication ([place])   . Sickle cell anemia [facility]     Patient Active Problem List   Diagnosis Date Noted  . Uncontrolled type 2 diabetes mellitus with hyperglycemia ([place]) 08/15/2019  . Sickle cell pain crisis ([place]) 01/11/2018  . Sickle cell anemia with pain ([place]) 07/22/2017  . Chronic musculoskeletal pain 03/30/2017  . Avascular necrosis ([place]) 03/30/2017  . Asthma, mild intermittent 03/30/2017  . Diabetes ([place]) 02/07/2017  . 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 CHOLECYSTECTOMY         Family History  Problem Relation Age of Onset  . Diabetes Mother     Social History   Tobacco Use  . Smoking status:  Never Smoker  . Smokeless tobacco: Never Used  Vaping Use  . Vaping Use: Never used  Substance Use Topics  . Alcohol use: No  . Drug use: No    Home Medications Prior to Admission medications   Medication Sig Start Date End Date Taking? Authorizing Provider  cetirizine (ZYRTEC) 10 MG tablet Take 1 tablet (10 mg total) by mouth daily. 07/08/18   Lanae Boast, FNP  fluticasone (FLONASE) 50 MCG/ACT nasal spray Place 2 sprays into both nostrils daily. 07/08/18   Lanae Boast, FNP  folic acid (FOLVITE) 1 MG tablet Take 1 tablet (1 mg total) by mouth daily. 10/11/19   Dorena Dew, FNP  glipiZIDE (GLUCOTROL) 10 MG tablet Take 1 tablet (10 mg total) by mouth daily before breakfast. 10/11/19   Dorena Dew, FNP  ibuprofen (ADVIL,MOTRIN) 600 MG tablet Take 1 tablet (600 mg total) by mouth every 8 (eight) hours as needed. 04/07/18   Lanae Boast, FNP  meloxicam (MOBIC) 15 MG tablet Take 1 tablet (15 mg total) by mouth daily. 04/13/19   Magnant, Charles L, PA-C  metFORMIN (GLUCOPHAGE) 1000 MG tablet Take 1 tablet (1,000 mg total) by mouth 2 (two) times daily with a meal. 10/11/19 01/09/20  Dorena Dew, FNP  ondansetron (ZOFRAN ODT) 8 MG disintegrating tablet Take 1 tablet (8 mg total) by mouth every 8 (eight) hours as needed for nausea or vomiting. 04/07/18  Lanae Boast, FNP  oxyCODONE (ROXICODONE) 15 MG immediate release tablet Take 1 tablet (15 mg total) by mouth every 4 (four) hours as needed for up to 15 days for pain. 10/11/19 11/29/19  Dorena Dew, FNP  Vitamin D, Ergocalciferol, (DRISDOL) 1.25 MG (50000 UNIT) CAPS capsule Take 1 capsule (50,000 Units total) by mouth every 7 (seven) days. 10/11/19   Dorena Dew, FNP    Allergies    Aspirin and Sulfa antibiotics  Review of Systems   Review of Systems  All other systems reviewed and are negative.   Physical Exam Updated Vital Signs BP (!) 150/96 (BP Location: Left Arm)   Pulse (!) 105   Temp 100.2 F (37.9 C)  (Oral)   Resp 18   Ht 5\' 6"  (1.676 m)   Wt 79.4 kg   SpO2 96%   BMI 28.25 kg/m   Physical Exam Vitals and nursing note reviewed.  Constitutional:      General: He is not in acute distress.    Appearance: He is well-developed.  HENT:     Head: Atraumatic.  Eyes:     Conjunctiva/sclera: Conjunctivae normal.  Cardiovascular:     Rate and Rhythm: Tachycardia present.     Pulses: Normal pulses.     Heart sounds: Normal heart sounds.  Pulmonary:     Effort: Pulmonary effort is normal.     Breath sounds: Normal breath sounds. No wheezing, rhonchi or rales.  Abdominal:     Palpations: Abdomen is soft.     Tenderness: There is no abdominal tenderness.  Musculoskeletal:     Cervical back: Neck supple. No rigidity or tenderness.     Comments: Able to move all 4 extremities.  Tenderness to bilateral upper extremity on palpation without focal point tenderness or overlying skin changes.  Radial pulse intact bilaterally.  Normal grip strength.  Skin:    Findings: No rash.  Neurological:     Mental Status: He is alert and oriented to person, place, and time.  Psychiatric:        Mood and Affect: Mood normal.     ED Results / Procedures / Treatments   Labs (all labs ordered are listed, but only abnormal results are displayed) Labs Reviewed  COMPREHENSIVE METABOLIC PANEL - Abnormal; Notable for the following components:      Result Value   Sodium 134 (*)    CO2 21 (*)    Glucose, Bld 177 (*)    Total Protein 8.6 (*)    Total Bilirubin 4.2 (*)    All other components within normal limits  CBC WITH DIFFERENTIAL/PLATELET - Abnormal; Notable for the following components:   WBC 17.9 (*)    HCT 35.9 (*)    MCHC 36.5 (*)    nRBC 4.4 (*)    Neutro Abs 13.9 (*)    Monocytes Absolute 1.3 (*)    Abs Immature Granulocytes 0.54 (*)    All other components within normal limits  RETICULOCYTES - Abnormal; Notable for the following components:   Retic Ct Pct 6.6 (*)    Retic Count,  Absolute 276.0 (*)    Immature Retic Fract 41.4 (*)    All other components within normal limits  RESPIRATORY PANEL BY RT PCR (FLU A&B, COVID)  URINALYSIS, ROUTINE W REFLEX MICROSCOPIC    EKG None  Radiology DG Chest Portable 1 View  Result Date: 11/30/2019 CLINICAL DATA:  Fever.  Sickle cell disease. EXAM: PORTABLE CHEST 1 VIEW COMPARISON:  10/20/2017. FINDINGS: The  heart size and mediastinal contours are within normal limits and similar to prior. No consolidation. No visible pleural effusions or pneumothorax. No acute osseous abnormality. Redemonstrated sclerosis of bilateral humeral heads, compatible with chronic osseous changes related to sickle cell disease. IMPRESSION: No acute cardiopulmonary disease. Electronically Signed   By: Margaretha Sheffield MD   On: 11/30/2019 13:02    Procedures Procedures (including critical care time)  Medications Ordered in ED Medications  0.45 % sodium chloride infusion (has no administration in time range)  HYDROmorphone (DILAUDID) injection 1 mg (has no administration in time range)  diphenhydrAMINE (BENADRYL) capsule 25-50 mg (25 mg Oral Given 11/30/19 1306)  ondansetron (ZOFRAN) injection 4 mg (4 mg Intravenous Given 11/30/19 1307)  ketorolac (TORADOL) 15 MG/ML injection 15 mg (15 mg Intravenous Given 11/30/19 1306)  HYDROmorphone (DILAUDID) injection 0.5 mg (0.5 mg Intravenous Given 11/30/19 1307)    ED Course  I have reviewed the triage vital signs and the nursing notes.  Pertinent labs & imaging results that were available during my care of the patient were reviewed by me and considered in my medical decision making (see chart for details).    MDM Rules/Calculators/A&P                          BP (!) 150/96 (BP Location: Left Arm)   Pulse (!) 105   Temp 100.2 F (37.9 C) (Oral)   Resp 18   Ht 5\' 6"  (1.676 m)   Wt 79.4 kg   SpO2 96%   BMI 28.25 kg/m   Final Clinical Impression(s) / ED Diagnoses Final diagnoses:  Sickle cell pain  crisis ([place])    Rx / DC Orders ED Discharge Orders    None     12:48 PM Patient here with bilateral arm and leg pain that felt similar to prior sickle cell crisis.  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  He was seen yesterday and received treatment but returned again because his symptoms has not resolved.  He tries taking his home oxycodone and Motrin without adequate relief.  Patient was found to have an elevated oral temperature of 100.2.  He does not complain of any infectious symptoms but he has not been vaccinated for COVID-19.  Will screen for potential infection including chest x-ray, UA, and Covid test.  Will provide pain management.  Work-up initiated.  2:04 PM  Fortunately COVID-19 test is negative, labs otherwise at baseline.  White count is 17.9, improved from prior.  Chest x-ray unremarkable.  Sickle cell specialist, [place], NP, seen evaluate patient in the ED.  She recommend patient to follow-up in her office tomorrow morning for further care.  She does not think patient needs to be admitted today.   Domenic Moras, PA-C 11/30/19 1447    Sherwood Gambler, MD 11/30/19 1534

## 2020-01-10 ENCOUNTER — Encounter: Payer: Self-pay | Admitting: Family Medicine

## 2020-01-10 ENCOUNTER — Other Ambulatory Visit: Payer: Self-pay

## 2020-01-10 ENCOUNTER — Ambulatory Visit (INDEPENDENT_AMBULATORY_CARE_PROVIDER_SITE_OTHER): Payer: Self-pay | Admitting: Family Medicine

## 2020-01-10 VITALS — BP 153/86 | HR 79 | Temp 97.9°F | Resp 18 | Ht 66.0 in | Wt 170.2 lb

## 2020-01-10 DIAGNOSIS — E119 Type 2 diabetes mellitus without complications: Secondary | ICD-10-CM

## 2020-01-10 DIAGNOSIS — D5700 Hb-SS disease with crisis, unspecified: Secondary | ICD-10-CM

## 2020-01-10 DIAGNOSIS — M7918 Myalgia, other site: Secondary | ICD-10-CM

## 2020-01-10 DIAGNOSIS — E559 Vitamin D deficiency, unspecified: Secondary | ICD-10-CM

## 2020-01-10 DIAGNOSIS — G8929 Other chronic pain: Secondary | ICD-10-CM

## 2020-01-10 LAB — POCT URINALYSIS DIP (CLINITEK)
Bilirubin, UA: NEGATIVE
Blood, UA: NEGATIVE
Glucose, UA: 250 mg/dL — AB
Ketones, POC UA: NEGATIVE mg/dL
Leukocytes, UA: NEGATIVE
Nitrite, UA: NEGATIVE
POC PROTEIN,UA: NEGATIVE
Spec Grav, UA: 1.015 (ref 1.010–1.025)
Urobilinogen, UA: 1 E.U./dL
pH, UA: 6 (ref 5.0–8.0)

## 2020-01-10 LAB — POCT GLYCOSYLATED HEMOGLOBIN (HGB A1C): Hemoglobin A1C: 5.6 % (ref 4.0–5.6)

## 2020-01-10 LAB — GLUCOSE, POCT (MANUAL RESULT ENTRY): POC Glucose: 224 mg/dl — AB (ref 70–99)

## 2020-01-10 MED ORDER — IBUPROFEN 600 MG PO TABS: 600.0000 mg | ORAL_TABLET | Freq: Three times a day (TID) | ORAL | 2 refills | Status: DC | PRN

## 2020-01-10 MED ORDER — GLIPIZIDE 10 MG PO TABS: 10.0000 mg | ORAL_TABLET | Freq: Every day | ORAL | 5 refills | Status: DC

## 2020-01-10 MED ORDER — VITAMIN D (ERGOCALCIFEROL) 1.25 MG (50000 UNIT) PO CAPS: 50000.0000 [IU] | ORAL_CAPSULE | ORAL | 1 refills | Status: DC

## 2020-01-10 MED ORDER — ONDANSETRON 8 MG PO TBDP: 8.0000 mg | ORAL_TABLET | Freq: Three times a day (TID) | ORAL | 0 refills | Status: AC | PRN

## 2020-01-10 MED ORDER — FOLIC ACID 1 MG PO TABS: 1.0000 mg | ORAL_TABLET | Freq: Every day | ORAL | 11 refills | Status: DC

## 2020-01-10 MED ORDER — METFORMIN HCL 1000 MG PO TABS: 1000.0000 mg | ORAL_TABLET | Freq: Two times a day (BID) | ORAL | 2 refills | Status: DC

## 2020-01-10 MED ORDER — OXYCODONE HCL 15 MG PO TABS: 15.0000 mg | ORAL_TABLET | ORAL | 0 refills | Status: DC | PRN

## 2020-01-10 NOTE — Progress Notes (Signed)
Patient [facility]   Established Patient Office Visit  Subjective:  Patient ID: Gregory Bryant, male    DOB: 10-15-1975  Age: 44 y.o. MRN: [id number]  CC:  Chief Complaint  Patient presents with  . Follow-up    HPI Gregory Bryant is a 44 year old male with a medical history significant for sickle cell disease,, type 2 diabetes mellitus, chronic pain syndrome, and history of anemia of chronic disease presents for 69-month follow-up of chronic conditions. Patient is doing well and is without complaints on today. He states that he has been taking all medications consistently. He has also been following a low-fat, low carbohydrate diet and taking all antidiabetic medications consistently. Patient continues to follow-up with hematologist around every 6 months. He is on hydroxyurea. He has well-controlled sickle cell disease with infrequent pain crisis. Patient has a history of chronic pain with pain primarily to lower back and lower extremities. He typically takes oxycodone and pain is maximally relief. He denies any dizziness, headache, blurred vision, polyuria, polydipsia, or polyphagia.   Past Medical History:  Diagnosis Date  . Diabetes mellitus without complication ([place])   . Sickle cell anemia (HCC)     Past Surgical History:  Procedure Laterality Date  . CHOLECYSTECTOMY      Family History  Problem Relation Age of Onset  . Diabetes Mother     Social History   Socioeconomic History  . Marital status: Divorced    Spouse name: Not on file  . Number of children: Not on file  . Years of education: Not on file  . Highest education level: Not on file  Occupational History  . Not on file  Tobacco Use  . Smoking status: Never Smoker  . Smokeless tobacco: Never Used  Vaping Use  . Vaping Use: Never used  Substance and Sexual Activity  . Alcohol use: No  . Drug use: No  . Sexual activity: Not on file  Other Topics Concern  .  Not on file  Social History Narrative  . Not on file   Social Determinants of Health   Financial Resource Strain: Not on file  Food Insecurity: Not on file  Transportation Needs: Not on file  Physical Activity: Not on file  Stress: Not on file  Social Connections: Not on file  Intimate Partner Violence: Not on file    Outpatient Medications Prior to Visit  Medication Sig Dispense Refill  . folic acid (FOLVITE) 1 MG tablet Take 1 tablet (1 mg total) by mouth daily. 30 tablet 11  . glipiZIDE (GLUCOTROL) 10 MG tablet Take 1 tablet (10 mg total) by mouth daily before breakfast. 30 tablet 5  . ibuprofen (ADVIL,MOTRIN) 600 MG tablet Take 1 tablet (600 mg total) by mouth every 8 (eight) hours as needed. 30 tablet 2  . metFORMIN (GLUCOPHAGE) 1000 MG tablet Take 1 tablet (1,000 mg total) by mouth 2 (two) times daily with a meal. 180 tablet 2  . ondansetron (ZOFRAN ODT) 8 MG disintegrating tablet Take 1 tablet (8 mg total) by mouth every 8 (eight) hours as needed for nausea or vomiting. 20 tablet 0  . oxyCODONE (ROXICODONE) 15 MG immediate release tablet Take 1 tablet (15 mg total) by mouth every 4 (four) hours as needed for up to 15 days for pain. 60 tablet 0  . Vitamin D, Ergocalciferol, (DRISDOL) 1.25 MG (50000 UNIT) CAPS capsule Take 1 capsule (50,000 Units total) by mouth every 7 (seven) days. 30 capsule 1  No facility-administered medications prior to visit.    Allergies  Allergen Reactions  . Aspirin Other (See Comments)    unknown  . Sulfa Antibiotics Other (See Comments)    unknown    ROS Review of Systems  Constitutional: Negative for fever.  HENT: Negative.   Eyes: Negative.   Respiratory: Negative.   Cardiovascular: Negative.   Gastrointestinal: Negative.   Endocrine: Negative.   Genitourinary: Negative.   Musculoskeletal: Positive for arthralgias, back pain and joint swelling.  Skin: Negative.   Neurological: Negative.   Hematological: Negative.    Psychiatric/Behavioral: Negative.       Objective:    Physical Exam Constitutional:      Appearance: Normal appearance.  Eyes:     Pupils: Pupils are equal, round, and reactive to light.  Cardiovascular:     Rate and Rhythm: Normal rate and regular rhythm.     Pulses: Normal pulses.  Pulmonary:     Effort: Pulmonary effort is normal.  Abdominal:     General: Abdomen is flat. Bowel sounds are normal.  Musculoskeletal:        General: Normal range of motion.  Skin:    General: Skin is warm.  Neurological:     General: No focal deficit present.     Mental Status: He is alert. Mental status is at baseline.  Psychiatric:        Mood and Affect: Mood normal.        Behavior: Behavior normal.        Thought Content: Thought content normal.        Judgment: Judgment normal.     BP (!) 153/86 (BP Location: Left Arm, Patient Position: Sitting, Cuff Size: Normal)   Pulse 79   Temp 97.9 F (36.6 C) (Temporal)   Resp 18   Ht 5\' 6"  (1.676 m)   Wt 170 lb 3.2 oz (77.2 kg)   SpO2 98%   BMI 27.47 kg/m  Wt Readings from Last 3 Encounters:  01/10/20 170 lb 3.2 oz (77.2 kg)  11/30/19 175 lb 0.7 oz (79.4 kg)  11/29/19 175 lb 0.7 oz (79.4 kg)     There are no preventive care reminders to display for this patient.  There are no preventive care reminders to display for this patient.  No results found for: TSH Lab Results  Component Value Date   WBC 17.9 (H) 11/30/2019   HGB 13.1 11/30/2019   HCT 35.9 (L) 11/30/2019   MCV 81.0 11/30/2019   PLT 362 11/30/2019   Lab Results  Component Value Date   NA 134 (L) 11/30/2019   K 3.6 11/30/2019   CO2 21 (L) 11/30/2019   GLUCOSE 177 (H) 11/30/2019   BUN 8 11/30/2019   CREATININE 0.71 11/30/2019   BILITOT 4.2 (H) 11/30/2019   ALKPHOS 92 11/30/2019   AST 38 11/30/2019   ALT 37 11/30/2019   PROT 8.6 (H) 11/30/2019   ALBUMIN 4.6 11/30/2019   CALCIUM 9.5 11/30/2019   ANIONGAP 10 11/30/2019   No results found for: CHOL No  results found for: HDL No results found for: LDLCALC No results found for: TRIG No results found for: CHOLHDL Lab Results  Component Value Date   HGBA1C 5.6 01/10/2020      Assessment & Plan:   Problem List Items Addressed This Visit      Endocrine   Diabetes ([place]) - Primary   Relevant Medications   glipiZIDE (GLUCOTROL) 10 MG tablet   metFORMIN (GLUCOPHAGE) 1000 MG tablet  Other Relevant Orders   Glucose (CBG) (Completed)   HgB A1c (Completed)   POCT URINALYSIS DIP (CLINITEK) (Completed)   Fructosamine     Other   Sickle cell anemia with pain (HCC)   Relevant Medications   oxyCODONE (ROXICODONE) 15 MG immediate release tablet   folic acid (FOLVITE) 1 MG tablet   ibuprofen (ADVIL) 600 MG tablet   Vitamin D, Ergocalciferol, (DRISDOL) 1.25 MG (50000 UNIT) CAPS capsule   Other Relevant Orders   Sickle Cell Panel   Chronic musculoskeletal pain   Relevant Medications   oxyCODONE (ROXICODONE) 15 MG immediate release tablet   ibuprofen (ADVIL) 600 MG tablet   Other Relevant Orders   [id number] 11+Oxyco+Alc+Crt-Bund    Other Visit Diagnoses    Vitamin D deficiency          Meds ordered this encounter  Medications  . oxyCODONE (ROXICODONE) 15 MG immediate release tablet    Sig: Take 1 tablet (15 mg total) by mouth every 4 (four) hours as needed for up to 15 days for pain.    Dispense:  60 tablet    Refill:  0    Order Specific Question:   Supervising Provider    Answer:   Tresa Garter [id number]  . glipiZIDE (GLUCOTROL) 10 MG tablet    Sig: Take 1 tablet (10 mg total) by mouth daily before breakfast.    Dispense:  30 tablet    Refill:  5    Order Specific Question:   Supervising Provider    Answer:   Tresa Garter [id number]  . metFORMIN (GLUCOPHAGE) 1000 MG tablet    Sig: Take 1 tablet (1,000 mg total) by mouth 2 (two) times daily with a meal.    Dispense:  180 tablet    Refill:  2    Order Specific Question:   Supervising Provider    Answer:    Tresa Garter [id number]  . folic acid (FOLVITE) 1 MG tablet    Sig: Take 1 tablet (1 mg total) by mouth daily.    Dispense:  30 tablet    Refill:  11    Order Specific Question:   Supervising Provider    Answer:   Tresa Garter [id number]  . ibuprofen (ADVIL) 600 MG tablet    Sig: Take 1 tablet (600 mg total) by mouth every 8 (eight) hours as needed.    Dispense:  30 tablet    Refill:  2    Order Specific Question:   Supervising Provider    Answer:   Tresa Garter [id number]  . Vitamin D, Ergocalciferol, (DRISDOL) 1.25 MG (50000 UNIT) CAPS capsule    Sig: Take 1 capsule (50,000 Units total) by mouth every 7 (seven) days.    Dispense:  30 capsule    Refill:  1    Order Specific Question:   Supervising Provider    Answer:   Tresa Garter [id number]  . ondansetron (ZOFRAN ODT) 8 MG disintegrating tablet    Sig: Take 1 tablet (8 mg total) by mouth every 8 (eight) hours as needed for nausea or vomiting.    Dispense:  20 tablet    Refill:  0    Order Specific Question:   Supervising Provider    Answer:   Tresa Garter [[id number]]    Follow-up: Return in about 3 months (around 04/09/2020).    Donia Pounds  APRN, MSN, FNP-C Patient [facility] [place],  [place] [address] [phone]

## 2020-01-10 NOTE — Progress Notes (Signed)
Needs RFs on folic acid, vitamin D, glipizide

## 2020-01-11 LAB — CMP14+CBC/D/PLT+FER+RETIC+V...
ALT: 33 IU/L (ref 0–44)
AST: 33 IU/L (ref 0–40)
Albumin/Globulin Ratio: 1.2 (ref 1.2–2.2)
Albumin: 4.6 g/dL (ref 4.0–5.0)
Alkaline Phosphatase: 127 IU/L — ABNORMAL HIGH (ref 44–121)
BUN/Creatinine Ratio: 6 — ABNORMAL LOW (ref 9–20)
BUN: 5 mg/dL — ABNORMAL LOW (ref 6–24)
Basophils Absolute: 0.2 10*3/uL (ref 0.0–0.2)
Basos: 1 %
Bilirubin Total: 3.5 mg/dL — ABNORMAL HIGH (ref 0.0–1.2)
CO2: 19 mmol/L — ABNORMAL LOW (ref 20–29)
Calcium: 10.4 mg/dL — ABNORMAL HIGH (ref 8.7–10.2)
Chloride: 99 mmol/L (ref 96–106)
Creatinine, Ser: 0.87 mg/dL (ref 0.76–1.27)
EOS (ABSOLUTE): 0.7 10*3/uL — ABNORMAL HIGH (ref 0.0–0.4)
Eos: 4 %
Ferritin: 1115 ng/mL — ABNORMAL HIGH (ref 30–400)
GFR calc Af Amer: 121 mL/min/{1.73_m2} (ref >59)
GFR calc non Af Amer: 105 mL/min/{1.73_m2} (ref >59)
Globulin, Total: 3.8 g/dL (ref 1.5–4.5)
Glucose: 204 mg/dL — ABNORMAL HIGH (ref 65–99)
Hematocrit: 41.6 % (ref 37.5–51.0)
Hemoglobin: 14.3 g/dL (ref 13.0–17.7)
Immature Grans (Abs): 0.4 10*3/uL — ABNORMAL HIGH (ref 0.0–0.1)
Immature Granulocytes: 2 %
Lymphocytes Absolute: 3.7 10*3/uL — ABNORMAL HIGH (ref 0.7–3.1)
Lymphs: 21 %
MCH: 29.4 pg (ref 26.6–33.0)
MCHC: 34.4 g/dL (ref 31.5–35.7)
MCV: 86 fL (ref 79–97)
Monocytes Absolute: 1.2 10*3/uL — ABNORMAL HIGH (ref 0.1–0.9)
Monocytes: 7 %
NRBC: 2 % — ABNORMAL HIGH (ref 0–0)
Neutrophils Absolute: 11.2 10*3/uL — ABNORMAL HIGH (ref 1.4–7.0)
Neutrophils: 65 %
Platelets: 434 10*3/uL (ref 150–450)
Potassium: 4.5 mmol/L (ref 3.5–5.2)
RBC: 4.86 x10E6/uL (ref 4.14–5.80)
RDW: 16 % — ABNORMAL HIGH (ref 11.6–15.4)
Retic Ct Pct: 7.4 % — ABNORMAL HIGH (ref 0.6–2.6)
Sodium: 137 mmol/L (ref 134–144)
Total Protein: 8.4 g/dL (ref 6.0–8.5)
Vit D, 25-Hydroxy: 36.2 ng/mL (ref 30.0–100.0)
WBC: 17.4 10*3/uL — ABNORMAL HIGH (ref 3.4–10.8)

## 2020-01-11 LAB — FRUCTOSAMINE: Fructosamine: 491 umol/L — ABNORMAL HIGH (ref 0–285)

## 2020-01-12 ENCOUNTER — Telehealth: Payer: Self-pay

## 2020-01-12 LAB — DRUG SCREEN 764883 11+OXYCO+ALC+CRT-BUND
Amphetamines, Urine: NEGATIVE ng/mL
BENZODIAZ UR QL: NEGATIVE ng/mL
Barbiturate: NEGATIVE ng/mL
Cannabinoid Quant, Ur: NEGATIVE ng/mL
Cocaine (Metabolite): NEGATIVE ng/mL
Creatinine: 95.8 mg/dL (ref 20.0–300.0)
Ethanol: NEGATIVE %
Meperidine: NEGATIVE ng/mL
Methadone Screen, Urine: NEGATIVE ng/mL
OPIATE SCREEN URINE: NEGATIVE ng/mL
Oxycodone/Oxymorphone, Urine: NEGATIVE ng/mL
Phencyclidine: NEGATIVE ng/mL
Propoxyphene: NEGATIVE ng/mL
Tramadol: NEGATIVE ng/mL
pH, Urine: 5.8 (ref 4.5–8.9)

## 2020-01-12 NOTE — Telephone Encounter (Signed)
Called lvm for pt return call regarding his lab results.

## 2020-01-12 NOTE — Telephone Encounter (Signed)
-----   Message from Dorena Dew, [place] sent at 01/12/2020  3:45 PM EST ----- Regarding: lab results Please inform patient that fructosamine level continues to be elevated, which is consistent with type 2 diabetes mellitus. No medication changes are warranted at this time. All of the laboratory values are within a normal range. Recommend that patient hydrates with 32-64 ounces per day. Also, remind patient of the importance of taking medication consistently in order to achieve positive outcomes. Sickle cell day clinic is available for acute sickle cell pain crisis on Monday through Friday from 8 AM to 4 PM. Intake is 8 AM to 12.  Otherwise, follow-up in 3 months as scheduled.  Donia Pounds  APRN, MSN, FNP-C Patient [facility] [address] [place], [place] [address] [phone]

## 2020-01-13 ENCOUNTER — Telehealth: Payer: Self-pay

## 2020-01-13 NOTE — Telephone Encounter (Signed)
Called  Spoke w/ regarding his lab results, reminded patient that his continue with medication and stay hydrated follow up in March for his follow up appt. Pt understood his results w/o any concerns.

## 2020-01-13 NOTE — Telephone Encounter (Signed)
-----   Message from Dorena Dew, [place] sent at 01/12/2020  3:45 PM EST ----- Regarding: lab results Please inform patient that fructosamine level continues to be elevated, which is consistent with type 2 diabetes mellitus. No medication changes are warranted at this time. All of the laboratory values are within a normal range. Recommend that patient hydrates with 32-64 ounces per day. Also, remind patient of the importance of taking medication consistently in order to achieve positive outcomes. Sickle cell day clinic is available for acute sickle cell pain crisis on Monday through Friday from 8 AM to 4 PM. Intake is 8 AM to 12.  Otherwise, follow-up in 3 months as scheduled.  Donia Pounds  APRN, MSN, FNP-C Patient [facility] [address] [place], [place] [address] [phone]

## 2020-03-23 ENCOUNTER — Other Ambulatory Visit: Payer: Self-pay | Admitting: Family Medicine

## 2020-03-23 ENCOUNTER — Telehealth: Payer: Self-pay

## 2020-03-23 ENCOUNTER — Other Ambulatory Visit: Payer: Self-pay

## 2020-03-23 ENCOUNTER — Encounter (HOSPITAL_COMMUNITY): Payer: Self-pay | Admitting: Emergency Medicine

## 2020-03-23 ENCOUNTER — Telehealth (HOSPITAL_COMMUNITY): Payer: Self-pay | Admitting: *Deleted

## 2020-03-23 ENCOUNTER — Emergency Department (HOSPITAL_COMMUNITY)
Admission: EM | Admit: 2020-03-23 | Discharge: 2020-03-23 | Disposition: A | Discharge status: Home / Self Care | Payer: Medicaid Other | Attending: Emergency Medicine | Admitting: Emergency Medicine

## 2020-03-23 ENCOUNTER — Emergency Department (HOSPITAL_COMMUNITY): Payer: Medicaid Other

## 2020-03-23 DIAGNOSIS — E119 Type 2 diabetes mellitus without complications: Secondary | ICD-10-CM | POA: Insufficient documentation

## 2020-03-23 DIAGNOSIS — D5700 Hb-SS disease with crisis, unspecified: Secondary | ICD-10-CM

## 2020-03-23 DIAGNOSIS — Z7984 Long term (current) use of oral hypoglycemic drugs: Secondary | ICD-10-CM | POA: Insufficient documentation

## 2020-03-23 DIAGNOSIS — R000 Tachycardia, unspecified: Secondary | ICD-10-CM | POA: Insufficient documentation

## 2020-03-23 DIAGNOSIS — Z20822 Contact with and (suspected) exposure to covid-19: Secondary | ICD-10-CM | POA: Insufficient documentation

## 2020-03-23 DIAGNOSIS — J4520 Mild intermittent asthma, uncomplicated: Secondary | ICD-10-CM | POA: Insufficient documentation

## 2020-03-23 DIAGNOSIS — K529 Noninfective gastroenteritis and colitis, unspecified: Secondary | ICD-10-CM | POA: Insufficient documentation

## 2020-03-23 DIAGNOSIS — D57219 Sickle-cell/Hb-C disease with crisis, unspecified: Secondary | ICD-10-CM | POA: Insufficient documentation

## 2020-03-23 LAB — COMPREHENSIVE METABOLIC PANEL
ALT: 52 U/L — ABNORMAL HIGH (ref 0–44)
AST: 47 U/L — ABNORMAL HIGH (ref 15–41)
Albumin: 4.5 g/dL (ref 3.5–5.0)
Alkaline Phosphatase: 98 U/L (ref 38–126)
Anion gap: 14 (ref 5–15)
BUN: 14 mg/dL (ref 6–20)
CO2: 20 mmol/L — ABNORMAL LOW (ref 22–32)
Calcium: 9.7 mg/dL (ref 8.9–10.3)
Chloride: 102 mmol/L (ref 98–111)
Creatinine, Ser: 0.97 mg/dL (ref 0.61–1.24)
GFR, Estimated: >60 mL/min (ref >60)
Glucose, Bld: 260 mg/dL — ABNORMAL HIGH (ref 70–99)
Potassium: 4.4 mmol/L (ref 3.5–5.1)
Sodium: 136 mmol/L (ref 135–145)
Total Bilirubin: 5.7 mg/dL — ABNORMAL HIGH (ref 0.3–1.2)
Total Protein: 8.5 g/dL — ABNORMAL HIGH (ref 6.5–8.1)

## 2020-03-23 LAB — URINALYSIS, ROUTINE W REFLEX MICROSCOPIC
Bilirubin Urine: NEGATIVE
Glucose, UA: 150 mg/dL — AB
Hgb urine dipstick: NEGATIVE
Ketones, ur: 5 mg/dL — AB
Leukocytes,Ua: NEGATIVE
Nitrite: NEGATIVE
Protein, ur: NEGATIVE mg/dL
Specific Gravity, Urine: 1.012 (ref 1.005–1.030)
pH: 6 (ref 5.0–8.0)

## 2020-03-23 LAB — CBC WITH DIFFERENTIAL/PLATELET
Abs Immature Granulocytes: 0.7 10*3/uL — ABNORMAL HIGH (ref 0.00–0.07)
Basophils Absolute: 0.1 10*3/uL (ref 0.0–0.1)
Basophils Relative: 0 %
Eosinophils Absolute: 0 10*3/uL (ref 0.0–0.5)
Eosinophils Relative: 0 %
HCT: 38.3 % — ABNORMAL LOW (ref 39.0–52.0)
Hemoglobin: 13.9 g/dL (ref 13.0–17.0)
Immature Granulocytes: 3 %
Lymphocytes Relative: 4 %
Lymphs Abs: 1 10*3/uL (ref 0.7–4.0)
MCH: 29.2 pg (ref 26.0–34.0)
MCHC: 36.3 g/dL — ABNORMAL HIGH (ref 30.0–36.0)
MCV: 80.5 fL (ref 80.0–100.0)
Monocytes Absolute: 1.1 10*3/uL — ABNORMAL HIGH (ref 0.1–1.0)
Monocytes Relative: 5 %
Neutro Abs: 22.4 10*3/uL — ABNORMAL HIGH (ref 1.7–7.7)
Neutrophils Relative %: 88 %
Platelets: 446 10*3/uL — ABNORMAL HIGH (ref 150–400)
RBC: 4.76 MIL/uL (ref 4.22–5.81)
RDW: 17.2 % — ABNORMAL HIGH (ref 11.5–15.5)
WBC: 25.4 10*3/uL — ABNORMAL HIGH (ref 4.0–10.5)
nRBC: 1.5 % — ABNORMAL HIGH (ref 0.0–0.2)

## 2020-03-23 LAB — RETICULOCYTES
Immature Retic Fract: 39.7 % — ABNORMAL HIGH (ref 2.3–15.9)
RBC.: 4.73 MIL/uL (ref 4.22–5.81)
Retic Count, Absolute: 386.5 10*3/uL — ABNORMAL HIGH (ref 19.0–186.0)
Retic Ct Pct: 8.1 % — ABNORMAL HIGH (ref 0.4–3.1)

## 2020-03-23 LAB — RESP PANEL BY RT-PCR (FLU A&B, COVID) ARPGX2
Influenza A by PCR: NEGATIVE
Influenza B by PCR: NEGATIVE
SARS Coronavirus 2 by RT PCR: NEGATIVE

## 2020-03-23 MED ORDER — ONDANSETRON HCL 4 MG/2ML IJ SOLN
4.0000 mg | Freq: Once | INTRAMUSCULAR | Status: AC
  Administered 2020-03-23: 4 mg via INTRAVENOUS
  Filled 2020-03-23: qty 2

## 2020-03-23 MED ORDER — HYDROMORPHONE HCL 2 MG/ML IJ SOLN
2.0000 mg | INTRAMUSCULAR | Status: AC
  Administered 2020-03-23: 2 mg via INTRAVENOUS
  Filled 2020-03-23: qty 1

## 2020-03-23 MED ORDER — OXYCODONE HCL 15 MG PO TABS
15.0000 mg | ORAL_TABLET | ORAL | 0 refills | Status: DC | PRN
Start: 2020-03-23 — End: 2020-07-17

## 2020-03-23 MED ORDER — DEXTROSE-NACL 5-0.45 % IV SOLN: INTRAVENOUS | Status: DC

## 2020-03-23 MED ORDER — HYDROMORPHONE HCL 1 MG/ML IJ SOLN
1.0000 mg | INTRAMUSCULAR | Status: AC
  Administered 2020-03-23: 1 mg via INTRAVENOUS
  Filled 2020-03-23: qty 1

## 2020-03-23 MED ORDER — SODIUM CHLORIDE 0.9 % IV BOLUS
1000.0000 mL | Freq: Once | INTRAVENOUS | Status: AC
  Administered 2020-03-23: 1000 mL via INTRAVENOUS

## 2020-03-23 MED ORDER — IOHEXOL 350 MG/ML SOLN
100.0000 mL | Freq: Once | INTRAVENOUS | Status: AC | PRN
  Administered 2020-03-23: 100 mL via INTRAVENOUS

## 2020-03-23 NOTE — Telephone Encounter (Signed)
Patient's wife called the day hospital requesting that patient be seen for sickle cell pain crisis. Patient could be heard in the background moaning in pain. Wife reports that patient is having bilateral leg, stomach and back pain rated 10/10. Reports that patient took Oxycodone at 6:40 am. COVID-19 screening done and wife reports that patient has a fever. Wife was unable to complete triage questions because she was tending to patient's needs.  Wife expressed that she feels she needs to call EMS for patient. This nurse advised that due to patient's fever and abdominal pain, patient would need to go to the ER for work-up. Patient's wife expressed an understanding.  [place], [place] also notified and advised that patient go to the ER.

## 2020-03-23 NOTE — Progress Notes (Signed)
Reviewed PDMP substance reporting system prior to prescribing opiate medications. No inconsistencies noted.   Past Medical History:  Diagnosis Date  . Diabetes mellitus without complication ([place])   . Sickle cell anemia ([place])     Donia Pounds  APRN, MSN, FNP-C Patient [facility] [address] [place], [place] [address] [phone]

## 2020-03-23 NOTE — Discharge Instructions (Addendum)
You are seen today for sickle cell crisis and gastroenteritis.  I did attach some instructions on food choices to help relieve diarrhea, in which you follow-up with your PCP in the next couple of days.  If you have a fever or if you have any new worsening concerning symptoms please come back to the emergency department. Please use attachments.   Get help right away if you: Have chest pain. Feel extremely weak or you faint. See blood in your vomit. Have vomit that looks like coffee grounds. Have bloody or black stools or stools that look like tar. Have a severe headache, a stiff neck, or both. Have a rash. Have severe pain, cramping, or bloating in your abdomen. Have trouble breathing or you are breathing very quickly. Have a fast heartbeat. Have skin that feels cold and clammy. Feel confused. Have pain when you urinate. Have signs of dehydration, such as: Dark urine, very little urine, or no urine. Cracked lips. Dry mouth. Sunken eyes. Sleepiness. Weakness.

## 2020-03-23 NOTE — ED Provider Notes (Signed)
[facility] DEPT Provider Note   CSN: [id number] Arrival date & time: 03/23/20  1036     History Chief Complaint  Patient presents with  . Sickle Cell Pain Crisis    Gregory Bryant is a 45 y.o. male with past medical history of diabetes, sickle cell anemia with previous sickle cell pain crisis admissions presents the emerge department today for sickle cell pain crisis and nausea vomiting diarrhea.  Patient states that around 2 AM last night he started having his typical sickle cell pain crisis, in his lower back that radiated down into his lower extremities.  States that this is very typical for him, took his oxycodone without any relief.  States that he normally has sickle cell pain weekly, takes his oxycodone once a week.  Has not had a crisis for couple months.  Patient states that he also started having nausea vomiting and diarrhea last night.  Has had multiple episodes of diarrhea and vomiting throughout the night up until now.  Currently feels nauseous.  States that this is not typical for his sickle cell pain crisis.  Child at home has outbreak at school for viral gastroenteritis outbreak at their school.  Has not been vaccinated against COVID.  Denies any other sick contacts.  Denies any cough, myalgias, headache, chest pain, shortness of breath, congestion, fevers, vision changes, neck pain.  States that he has not been able to tolerate water, continuously vomits this.  States that he has a little bit of abdominal pain from the vomiting, otherwise no abdominal pain.  Denies any numbness or tingling, other complaints at this time.  HPI     Past Medical History:  Diagnosis Date  . Diabetes mellitus without complication ([place])   . Sickle cell anemia [facility])     Patient Active Problem List   Diagnosis Date Noted  . Uncontrolled type 2 diabetes mellitus with hyperglycemia ([place]) 08/15/2019  . Sickle cell pain crisis ([place]) 01/11/2018  . Sickle cell  anemia with pain ([place]) 07/22/2017  . Chronic musculoskeletal pain 03/30/2017  . Avascular necrosis ([place]) 03/30/2017  . Asthma, mild intermittent 03/30/2017  . Diabetes ([place]) 02/07/2017  . 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 CHOLECYSTECTOMY         Family History  Problem Relation Age of Onset  . Diabetes Mother     Social History   Tobacco Use  . Smoking status: Never Smoker  . Smokeless tobacco: Never Used  Vaping Use  . Vaping Use: Never used  Substance Use Topics  . Alcohol use: No  . Drug use: No    Home Medications Prior to Admission medications   Medication Sig Start Date End Date Taking? Authorizing Provider  folic acid (FOLVITE) 1 MG tablet Take 1 tablet (1 mg total) by mouth daily. 01/10/20   Dorena Dew, FNP  glipiZIDE (GLUCOTROL) 10 MG tablet Take 1 tablet (10 mg total) by mouth daily before breakfast. 01/10/20   Dorena Dew, FNP  ibuprofen (ADVIL) 600 MG tablet Take 1 tablet (600 mg total) by mouth every 8 (eight) hours as needed. 01/10/20   Dorena Dew, FNP  metFORMIN (GLUCOPHAGE) 1000 MG tablet Take 1 tablet (1,000 mg total) by mouth 2 (two) times daily with a meal. 01/10/20 04/09/20  Dorena Dew, FNP  ondansetron (ZOFRAN ODT) 8 MG disintegrating tablet Take 1 tablet (8 mg total) by mouth every 8 (eight) hours as needed for nausea or vomiting.  01/10/20   Dorena Dew, FNP  oxyCODONE (ROXICODONE) 15 MG immediate release tablet Take 1 tablet (15 mg total) by mouth every 4 (four) hours as needed for up to 15 days for pain. 03/23/20 04/07/20  Dorena Dew, FNP  Vitamin D, Ergocalciferol, (DRISDOL) 1.25 MG (50000 UNIT) CAPS capsule Take 1 capsule (50,000 Units total) by mouth every 7 (seven) days. 01/10/20   Dorena Dew, FNP  cetirizine (ZYRTEC) 10 MG tablet Take 1 tablet (10 mg total) by mouth daily. Patient not taking: Reported on 11/30/2019 07/08/18 11/30/19  Lanae Boast, FNP   fluticasone [facility]) 50 MCG/ACT nasal spray Place 2 sprays into both nostrils daily. Patient not taking: Reported on 11/30/2019 07/08/18 11/30/19  Lanae Boast, FNP    Allergies    Aspirin and Sulfa antibiotics  Review of Systems   Review of Systems  Constitutional: Negative for chills, diaphoresis, fatigue and fever.  HENT: Negative for congestion, sore throat and trouble swallowing.   Eyes: Negative for pain and visual disturbance.  Respiratory: Negative for cough, shortness of breath and wheezing.   Cardiovascular: Negative for chest pain, palpitations and leg swelling.  Gastrointestinal: Positive for diarrhea, nausea and vomiting. Negative for abdominal distention and abdominal pain.  Genitourinary: Negative for difficulty urinating.  Musculoskeletal: Positive for arthralgias and back pain. Negative for neck pain and neck stiffness.  Skin: Negative for pallor.  Neurological: Negative for dizziness, speech difficulty, weakness and headaches.  Psychiatric/Behavioral: Negative for confusion.    Physical Exam Updated Vital Signs BP 135/90 (BP Location: Left Arm)   Pulse 90 Comment: Simultaneous filing. User may not have seen previous data.  Temp 98.3 F (36.8 C) (Oral)   Resp 16 Comment: Simultaneous filing. User may not have seen previous data.  Ht 5\' 6"  (1.676 m)   Wt 75 kg   SpO2 97% Comment: Simultaneous filing. User may not have seen previous data.  BMI 26.69 kg/m   Physical Exam Constitutional:      General: He is not in acute distress.    Appearance: Normal appearance. He is not ill-appearing, toxic-appearing or diaphoretic.  HENT:     Mouth/Throat:     Mouth: Mucous membranes are moist.     Pharynx: Oropharynx is clear.  Eyes:     General: No scleral icterus.    Extraocular Movements: Extraocular movements intact.     Pupils: Pupils are equal, round, and reactive to light.  Cardiovascular:     Rate and Rhythm: Regular rhythm. Tachycardia present.     Pulses:  Normal pulses.     Heart sounds: Normal heart sounds.  Pulmonary:     Effort: Pulmonary effort is normal. No respiratory distress.     Breath sounds: Normal breath sounds. No stridor. No wheezing, rhonchi or rales.  Chest:     Chest wall: No tenderness.  Abdominal:     General: Abdomen is flat. There is no distension.     Palpations: Abdomen is soft.     Tenderness: There is no abdominal tenderness. There is no guarding or rebound.     Comments: No abdominal tenderness  Musculoskeletal:        General: No swelling or tenderness. Normal range of motion.     Cervical back: Normal range of motion and neck supple. No rigidity.       Back:     Right lower leg: No edema.     Left lower leg: No edema.     Comments: Patient with pain in lower back  spans across lumbar sections depicted in picture.  Patient does also have pain in lower extremities.  Normal range of motion with normal strength and sensation, PT pulses 2+.  Compartments are soft.  Normal leg raise.  Skin:    General: Skin is warm and dry.     Capillary Refill: Capillary refill takes less than 2 seconds.     Coloration: Skin is not pale.  Neurological:     General: No focal deficit present.     Mental Status: He is alert and oriented to person, place, and time.     Comments: Normal gait  Psychiatric:        Mood and Affect: Mood normal.        Behavior: Behavior normal.     ED Results / Procedures / Treatments   Labs (all labs ordered are listed, but only abnormal results are displayed) Labs Reviewed  URINALYSIS, ROUTINE W REFLEX MICROSCOPIC - Abnormal; Notable for the following components:      Result Value   Glucose, UA 150 (*)    Ketones, ur 5 (*)    All other components within normal limits  COMPREHENSIVE METABOLIC PANEL - Abnormal; Notable for the following components:   CO2 20 (*)    Glucose, Bld 260 (*)    Total Protein 8.5 (*)    AST 47 (*)    ALT 52 (*)    Total Bilirubin 5.7 (*)    All other components  within normal limits  CBC WITH DIFFERENTIAL/PLATELET - Abnormal; Notable for the following components:   WBC 25.4 (*)    HCT 38.3 (*)    MCHC 36.3 (*)    RDW 17.2 (*)    Platelets 446 (*)    nRBC 1.5 (*)    Neutro Abs 22.4 (*)    Monocytes Absolute 1.1 (*)    Abs Immature Granulocytes 0.70 (*)    All other components within normal limits  RETICULOCYTES - Abnormal; Notable for the following components:   Retic Ct Pct 8.1 (*)    Retic Count, Absolute 386.5 (*)    Immature Retic Fract 39.7 (*)    All other components within normal limits  RESP PANEL BY RT-PCR (FLU A&B, COVID) ARPGX2    EKG None  Radiology CT Angio Chest PE W/Cm &/Or Wo Cm  Result Date: 03/23/2020 CLINICAL DATA:  Back pain. EXAM: CT ANGIOGRAPHY CHEST WITH CONTRAST TECHNIQUE: Multidetector CT imaging of the chest was performed using the standard protocol during bolus administration of intravenous contrast. Multiplanar CT image reconstructions and MIPs were obtained to evaluate the vascular anatomy. CONTRAST:  161mL OMNIPAQUE IOHEXOL 350 MG/ML SOLN COMPARISON:  None. FINDINGS: Cardiovascular: Satisfactory opacification of the pulmonary arteries to the segmental level. No evidence of pulmonary embolism. Normal heart size. No pericardial effusion. Mediastinum/Nodes: No enlarged mediastinal, hilar, or axillary lymph nodes. Thyroid gland, trachea, and esophagus demonstrate no significant findings. Lungs/Pleura: Lungs are clear. No pleural effusion or pneumothorax. Upper Abdomen: Severe gastric distention is noted. Musculoskeletal: Sclerotic densities are noted throughout the visualized skeleton consistent with history of sickle cell disease. No fracture is noted. Review of the MIP images confirms the above findings. IMPRESSION: 1. No definite evidence of pulmonary embolus. 2. Severe gastric distention is noted. 3. Sclerotic densities are noted throughout the visualized skeleton consistent with history of sickle cell disease.  Electronically Signed   By: Marijo Conception M.D.   On: 03/23/2020 15:50    Procedures Procedures   Medications Ordered in ED Medications  dextrose 5 %-0.45 % sodium chloride infusion ( Intravenous New Bag/Given 03/23/20 1156)  HYDROmorphone (DILAUDID) injection 2 mg (2 mg Intravenous Given 03/23/20 1154)  HYDROmorphone (DILAUDID) injection 1 mg (1 mg Intravenous Given 03/23/20 1354)  sodium chloride 0.9 % bolus 1,000 mL (0 mLs Intravenous Stopped 03/23/20 1348)  ondansetron (ZOFRAN) injection 4 mg (4 mg Intravenous Given 03/23/20 1152)  iohexol (OMNIPAQUE) 350 MG/ML injection 100 mL (100 mLs Intravenous Contrast Given 03/23/20 1529)    ED Course  I have reviewed the triage vital signs and the nursing notes.  Pertinent labs & imaging results that were available during my care of the patient were reviewed by me and considered in my medical decision making (see chart for details).  Clinical Course as of 03/23/20 1628  Fri Mar 23, 2020  1339 I was directly involved in this patients medical care.  [JH]    Clinical Course User Index [JH] Luna Fuse, MD   MDM Rules/Calculators/A&P                          Gregory Bryant is a 45 y.o. male with past medical history of diabetes, sickle cell anemia with previous sickle cell pain crisis admissions presents the emerge department today for sickle cell pain crisis and nausea vomiting diarrhea.  Patient does not appear in acute distress, is distally neurovascularly intact.  Will obtain basic blood work, and treat for sickle cell crisis.  I assume that patient most likely has a viral gastroenteritis causing his sickle cell crisis.  No abdominal tenderness on exam.  We will also obtain COVID test at this time.   Work-up today shows CBC with 25.4, most likely due to sickle cell since patient does have white count of 17-24 when he has sickle cell crisis most likely exacerbated by vomiting.  Covid negative.  CMP with slightly elevated AST and ALT  47,52 respectively.  Patient also does have elevated bili of 5.7, slightly above patient's baseline.  Patient has been slightly tachycardic while being here, 120s, no hypoxia.  Will obtain CT angio at this time as well since patient is high risk.  CT angio was negative, does show gastric distention most likely due to gastroenteritis.  Patient is been here for 6 hours, has not vomited.  He states that pain is a 0 out of 10 and feels much better than before.  He wants to go home.  No concerns for aplastic anemia, CVA or acute chest.  Most likely has gastroenteritis from child at home, symptomatic treatment discussed with patient.  Patient will follow up with PCP.  Patient be discharged.  Passed p.o. challenge.  Doubt need for further emergent work up at this time. I explained the diagnosis and have given explicit precautions to return to the ER including for any other new or worsening symptoms. The patient understands and accepts the medical plan as it's been dictated and I have answered their questions. Discharge instructions concerning home care and prescriptions have been given. The patient is STABLE and is discharged to home in good condition.  I discussed this case with my attending physician who cosigned this note including patient's presenting symptoms, physical exam, and planned diagnostics and interventions. Attending physician stated agreement with plan or made changes to plan which were implemented.   Final Clinical Impression(s) / ED Diagnoses Final diagnoses:  Sickle cell pain crisis ([place])  Gastroenteritis    Rx / DC Orders ED Discharge Orders  None       Alfredia Client, PA-C 03/23/20 1630    Luna Fuse, MD 03/28/20 [phone]

## 2020-03-23 NOTE — Telephone Encounter (Signed)
Med refill Oxycodone 15 mg  [employer]

## 2020-03-23 NOTE — ED Triage Notes (Signed)
Patient complains of sickle cell pain crisis since 2 am this morning in his legs and back (typical pain presentation for his crisis), nausea, emesis and diarrhea started shortly after pain onset. Took home tylenol and oxy w/o relief. Took Zofran with some nausea relief.

## 2020-03-26 ENCOUNTER — Telehealth: Payer: Self-pay | Admitting: *Deleted

## 2020-03-26 NOTE — Telephone Encounter (Signed)
Transition Care Management Follow-up Telephone Call  Date of discharge and from where: 03/23/2020 - [facility]  How have you been since you were released from the hospital? "Slightly better"  Any questions or concerns? No  Items Reviewed:  Did the pt receive and understand the discharge instructions provided? Yes   Medications obtained and verified? Yes   Other? No   Any new allergies since your discharge? No   Dietary orders reviewed? Yes  Do you have support at home? Yes   Home Care and Equipment/Supplies: Were home health services ordered? not applicable If so, what is the name of the agency? N/A  Has the agency set up a time to come to the patient's home? not applicable Were any new equipment or medical supplies ordered?  No What is the name of the medical supply agency? N/A Were you able to get the supplies/equipment? not applicable Do you have any questions related to the use of the equipment or supplies? No  Functional Questionnaire: (I = Independent and D = Dependent) ADLs: I  Bathing/Dressing- I  Meal Prep- I  Eating- I  Maintaining continence- I  Transferring/Ambulation- I  Managing Meds- I  Follow up appointments reviewed:   PCP Hospital f/u appt confirmed? Yes  Scheduled to see Gregory Sickle, NP on 04/10/2020 @ 0920.  [facility] f/u appt confirmed? No    Are transportation arrangements needed? No   If their condition worsens, is the pt aware to call PCP or go to the Emergency Dept.? Yes  Was the patient provided with contact information for the PCP's office or ED? Yes  Was to pt encouraged to call back with questions or concerns? Yes

## 2020-04-10 ENCOUNTER — Ambulatory Visit (INDEPENDENT_AMBULATORY_CARE_PROVIDER_SITE_OTHER): Payer: Self-pay | Admitting: Family Medicine

## 2020-04-10 ENCOUNTER — Other Ambulatory Visit: Payer: Self-pay

## 2020-04-10 ENCOUNTER — Encounter: Payer: Self-pay | Admitting: Family Medicine

## 2020-04-10 VITALS — BP 140/80 | HR 72 | Ht 66.0 in | Wt 172.0 lb

## 2020-04-10 DIAGNOSIS — I10 Essential (primary) hypertension: Secondary | ICD-10-CM

## 2020-04-10 DIAGNOSIS — D5700 Hb-SS disease with crisis, unspecified: Secondary | ICD-10-CM

## 2020-04-10 DIAGNOSIS — E119 Type 2 diabetes mellitus without complications: Secondary | ICD-10-CM

## 2020-04-10 DIAGNOSIS — E559 Vitamin D deficiency, unspecified: Secondary | ICD-10-CM

## 2020-04-10 LAB — POCT URINALYSIS DIPSTICK
Bilirubin, UA: NEGATIVE
Blood, UA: NEGATIVE
Glucose, UA: NEGATIVE
Ketones, UA: NEGATIVE
Leukocytes, UA: NEGATIVE
Nitrite, UA: NEGATIVE
Protein, UA: NEGATIVE
Spec Grav, UA: 1.01 (ref 1.010–1.025)
Urobilinogen, UA: 0.2 E.U./dL
pH, UA: 5.5 (ref 5.0–8.0)

## 2020-04-10 MED ORDER — LISINOPRIL 2.5 MG PO TABS: 2.5000 mg | ORAL_TABLET | Freq: Every day | ORAL | 5 refills | Status: DC

## 2020-04-10 NOTE — Patient Instructions (Addendum)
Please have your Ophthalmologist send [place] your Diabetic Eye Exam records.  Fax [phone].  We will start lisinopril 2.5 mg daily for hypertension.  Discussed side effects at length.  All other medications will remain the same.  For your sickle cell, continue to hydrate with 32-64 ounces of water per day.  Also, it is important that you take hydroxyurea exactly as prescribed.  Follow-up with hematologist as scheduled.    Lisinopril Tablets What is this medicine? LISINOPRIL (lyse IN oh pril) is an ACE inhibitor. It treats high blood pressure and heart failure. It can treat heart damage after a heart attack. This medicine may be used for other purposes; ask your health care provider or pharmacist if you have questions. COMMON BRAND NAME(S): Prinivil, Zestril What should I tell my health care provider before I take this medicine? They need to know if you have any of these conditions:  diabetes  heart or blood vessel disease  kidney disease  low blood pressure  previous swelling of the tongue, face, or lips with difficulty breathing, difficulty swallowing, hoarseness, or tightening of the throat  an unusual or allergic reaction to lisinopril, other ACE inhibitors, insect venom, foods, dyes, or preservatives  pregnant or trying to get pregnant  breast-feeding How should I use this medicine? Take this medicine by mouth. Take it as directed on the prescription label at the same time every day. You can take it with or without food. If it upsets your stomach, take it with food. Keep taking it unless your health care provider tells you to stop. Talk to your health care provider about the use of this medicine in children. While it may be prescribed for children as young as 6 for selected conditions, precautions do apply. Overdosage: If you think you have taken too much of this medicine contact a poison control center or emergency room at once. NOTE: This medicine is only for you. Do not share  this medicine with others. What if I miss a dose? If you miss a dose, take it as soon as you can. If it is almost time for your next dose, take only that dose. Do not take double or extra doses. What may interact with this medicine? Do not take this medicine with any of the following medications:  hymenoptera venom  sacubitril; valsartan This medicines may also interact with the following medications:  aliskiren  angiotensin receptor blockers, like losartan or valsartan  certain medicines for diabetes  diuretics  everolimus  gold compounds  lithium  NSAIDs, medicines for pain and inflammation, like ibuprofen or naproxen  potassium salts or supplements  salt substitutes  sirolimus  temsirolimus This list may not describe all possible interactions. Give your health care provider a list of all the medicines, herbs, non-prescription drugs, or dietary supplements you use. Also tell them if you smoke, drink alcohol, or use illegal drugs. Some items may interact with your medicine. What should I watch for while using this medicine? Visit your health care provider for regular check ups. Check your blood pressure as directed. Ask your health care provider what your blood pressure should be. Also, find out when you should contact him or her. Do not treat yourself for coughs, colds, or pain while you are using this medicine without asking your health care provider for advice. Some medicines may increase your blood pressure. Inform your health care provider if you wish to become pregnant or think you might be pregnant. There is a potential for serious side effects  to an unborn child. Talk to your health care provider for more information. You may get drowsy or dizzy. Do not drive, use machinery, or do anything that needs mental alertness until you know how this medicine affects you. Do not stand or sit up quickly, especially if you are an older patient. This reduces the risk of dizzy or  fainting spells. Alcohol can make you more drowsy and dizzy. Avoid alcoholic drinks. Avoid salt substitutes unless you are told otherwise by your health care provider. What side effects may I notice from receiving this medicine? Side effects that you should report to your doctor or health care professional as soon as possible:  allergic reactions (skin rash, itching or hives, swelling of the hands, feet, face, lips, throat, or tongue)  breathing problems  high potassium levels (chest pain; or fast, irregular heartbeat; muscle weakness)  kidney injury (trouble passing urine or change in the amount of urine)  liver injury (dark yellow or brown urine; general ill feeling or flu-like symptoms; light-colored stools; loss of appetite; right upper belly pain; unusually weak or tired; yellowing of the eyes or skin)  low blood pressure (dizziness; feeling faint or lightheaded, falls; unusually weak or tired) Side effects that usually do not require medical attention (report to your doctor or health care professional if they continue or are bothersome):  changes in taste  cough  dizziness  headache This list may not describe all possible side effects. Call your doctor for medical advice about side effects. You may report side effects to FDA at 1-800-FDA-1088. Where should I keep my medicine? Keep out of the reach of children and pets. Store at room temperature between 20 and 25 degrees C (68 and 77 degrees F). Protect from moisture. Keep the container tightly closed. Do not freeze. Avoid exposure to extreme heat. Get rid of any unused medicine after the expiration date. To get rid of medicines that are no longer needed or have expired:  Take the medicine to a medicine take-back program. Check with your pharmacy or law enforcement to find a location.  If you cannot return the medicine, check the label or package insert to see if the medicine should be thrown out in the garbage or flushed down  the toilet. If you are not sure, ask your health care provider. If it is safe to put in the trash, empty the medicine out of the container. Mix the medicine with cat litter, dirt, coffee grounds, or other unwanted substance. Seal the mixture in a bag or container. Put it in the trash. NOTE: This sheet is a summary. It may not cover all possible information. If you have questions about this medicine, talk to your doctor, pharmacist, or health care provider.  2021 [employer]vier/Gold Standard (2019-12-10 14:57:12)

## 2020-04-10 NOTE — Progress Notes (Signed)
Patient [facility]   Established Patient Office Visit  Subjective:  Patient ID: Gregory Bryant, male    DOB: 07-09-1975  Age: 45 y.o. MRN: [id number]  CC:  Chief Complaint  Patient presents with  . Diabetes  . Sickle Cell Anemia    HPI Gregory Bryant is a 45 year old male with a medical history significant for sickle cell disease, chronic pain syndrome, opiate dependence and tolerance, type 2 diabetes mellitus, and history of anemia of chronic disease resents for follow-up of chronic conditions.  Patient says that he has been doing well and has minimal complaints today.  He has some pain primarily to lower extremities, which is consistent with his chronic pain related to sickle cell disease. Patient is up-to-date with his eye exam.  He is also up-to-date with all vaccinations.  He denies any headache, blurred vision, chest pain, urinary symptoms, nausea, vomiting, or diarrhea. Patient also has a history of type 2 diabetes mellitus.  He has been taking all medications consistently and without interruption.  He typically checks his feet daily.  He denies any breakdown.  He does not check blood glucose at home.  He denies any polyuria, polydipsia, or polyphagia.  Past Medical History:  Diagnosis Date  . Diabetes mellitus without complication ([place])   . Sickle cell anemia (HCC)     Past Surgical History:  Procedure Laterality Date  . CHOLECYSTECTOMY      Family History  Problem Relation Age of Onset  . Diabetes Mother     Social History   Socioeconomic History  . Marital status: Married    Spouse name: Not on file  . Number of children: Not on file  . Years of education: Not on file  . Highest education level: Not on file  Occupational History  . Not on file  Tobacco Use  . Smoking status: Never Smoker  . Smokeless tobacco: Never Used  Vaping Use  . Vaping Use: Never used  Substance and Sexual Activity  .  Alcohol use: No  . Drug use: No  . Sexual activity: Not on file  Other Topics Concern  . Not on file  Social History Narrative  . Not on file   Social Determinants of Health   Financial Resource Strain: Not on file  Food Insecurity: Not on file  Transportation Needs: Not on file  Physical Activity: Not on file  Stress: Not on file  Social Connections: Not on file  Intimate Partner Violence: Not on file    Outpatient Medications Prior to Visit  Medication Sig Dispense Refill  . folic acid (FOLVITE) 1 MG tablet Take 1 tablet (1 mg total) by mouth daily. 30 tablet 11  . glipiZIDE (GLUCOTROL) 10 MG tablet Take 1 tablet (10 mg total) by mouth daily before breakfast. 30 tablet 5  . ibuprofen (ADVIL) 600 MG tablet Take 1 tablet (600 mg total) by mouth every 8 (eight) hours as needed. 30 tablet 2  . metFORMIN (GLUCOPHAGE) 1000 MG tablet Take 1 tablet (1,000 mg total) by mouth 2 (two) times daily with a meal. 180 tablet 2  . ondansetron (ZOFRAN ODT) 8 MG disintegrating tablet Take 1 tablet (8 mg total) by mouth every 8 (eight) hours as needed for nausea or vomiting. 20 tablet 0  . oxyCODONE (ROXICODONE) 15 MG immediate release tablet Take 1 tablet (15 mg total) by mouth every 4 (four) hours as needed for up to 15 days for pain.  60 tablet 0  . Vitamin D, Ergocalciferol, (DRISDOL) 1.25 MG (50000 UNIT) CAPS capsule Take 1 capsule (50,000 Units total) by mouth every 7 (seven) days. 30 capsule 1   No facility-administered medications prior to visit.    Allergies  Allergen Reactions  . Aspirin Other (See Comments)    unknown  . Sulfa Antibiotics Other (See Comments)    unknown    ROS Review of Systems  Constitutional: Negative.   HENT: Negative.   Respiratory: Negative.   Cardiovascular: Negative.   Gastrointestinal: Negative.   Endocrine: Negative for polydipsia, polyphagia and polyuria.  Genitourinary: Negative.   Musculoskeletal: Positive for arthralgias.  Skin: Negative.    Psychiatric/Behavioral: Negative.       Objective:    Physical Exam Constitutional:      Appearance: Normal appearance.  Eyes:     Pupils: Pupils are equal, round, and reactive to light.  Cardiovascular:     Rate and Rhythm: Normal rate and regular rhythm.     Pulses: Normal pulses.  Pulmonary:     Effort: Pulmonary effort is normal.  Abdominal:     General: Bowel sounds are normal.  Neurological:     General: No focal deficit present.     Mental Status: He is alert. Mental status is at baseline.  Psychiatric:        Mood and Affect: Mood normal.        Behavior: Behavior normal.        Thought Content: Thought content normal.        Judgment: Judgment normal.     BP 140/80   Pulse 72   Ht 5\' 6"  (1.676 m)   Wt 172 lb (78 kg)   SpO2 97%   BMI 27.76 kg/m  Wt Readings from Last 3 Encounters:  04/10/20 172 lb (78 kg)  03/23/20 165 lb 5.5 oz (75 kg)  01/10/20 170 lb 3.2 oz (77.2 kg)     Health Maintenance Due  Topic Date Due  . OPHTHALMOLOGY EXAM  09/30/2019    There are no preventive care reminders to display for this patient.  No results found for: TSH Lab Results  Component Value Date   WBC 25.4 (H) 03/23/2020   HGB 13.9 03/23/2020   HCT 38.3 (L) 03/23/2020   MCV 80.5 03/23/2020   PLT 446 (H) 03/23/2020   Lab Results  Component Value Date   NA 136 03/23/2020   K 4.4 03/23/2020   CO2 20 (L) 03/23/2020   GLUCOSE 260 (H) 03/23/2020   BUN 14 03/23/2020   CREATININE 0.97 03/23/2020   BILITOT 5.7 (H) 03/23/2020   ALKPHOS 98 03/23/2020   AST 47 (H) 03/23/2020   ALT 52 (H) 03/23/2020   PROT 8.5 (H) 03/23/2020   ALBUMIN 4.5 03/23/2020   CALCIUM 9.7 03/23/2020   ANIONGAP 14 03/23/2020   No results found for: CHOL No results found for: HDL No results found for: LDLCALC No results found for: TRIG No results found for: CHOLHDL Lab Results  Component Value Date   HGBA1C 5.6 01/10/2020      Assessment & Plan:   Problem List Items Addressed  This Visit      Endocrine   Diabetes ([place]) - Primary   Relevant Orders   POCT glycosylated hemoglobin (Hb A1C)   POCT urinalysis dipstick   Microalbumin / creatinine urine ratio   Fructosamine     Other   Sickle cell anemia with pain (HCC)   Relevant Orders   POCT glycosylated hemoglobin (  Hb A1C)   Sickle Cell Panel    Other Visit Diagnoses    Vitamin D deficiency       Relevant Orders   Sickle Cell Panel     1. Type 2 diabetes mellitus without complication, without long-term current use of insulin ([place]) Will review fructosamine as results become available. No medication changes are warranted today, we will follow-up after reviewing labs. - POCT glycosylated hemoglobin (Hb A1C) - POCT urinalysis dipstick - Microalbumin / creatinine urine ratio - Fructosamine  2. Sickle cell anemia with pain ([place]) Patient advised to continue hydrating with 64 ounces of fluid per day.  Also, he is scheduled to follow-up with hematology on 04/12/2020. - POCT glycosylated hemoglobin (Hb A1C) - Sickle Cell Panel  3. Vitamin D deficiency  - Sickle Cell Panel  4. Essential hypertension BP 140/80   Pulse 72   Ht 5\' 6"  (1.676 m)   Wt 172 lb (78 kg)   SpO2 97%   BMI 27.76 kg/m  - Continue medication, monitor blood pressure at home. Continue DASH diet. Reminder to go to the ER if any CP, SOB, nausea, dizziness, severe HA, changes vision/speech, left arm numbness and tingling and jaw pain.    - lisinopril (ZESTRIL) 2.5 MG tablet; Take 1 tablet (2.5 mg total) by mouth daily.  Dispense: 30 tablet; Refill: 5  No orders of the defined types were placed in this encounter.   Follow-up: Return in about 3 months (around 07/11/2020) for sickle cell anemia.   Donia Pounds  APRN, MSN, FNP-C Patient [facility] [address] [place],  [address] [phone]

## 2020-04-11 LAB — CMP14+CBC/D/PLT+FER+RETIC+V...
ALT: 40 IU/L (ref 0–44)
AST: 32 IU/L (ref 0–40)
Albumin/Globulin Ratio: 1.4 (ref 1.2–2.2)
Albumin: 4.4 g/dL (ref 4.0–5.0)
Alkaline Phosphatase: 118 IU/L (ref 44–121)
BUN/Creatinine Ratio: 14 (ref 9–20)
BUN: 12 mg/dL (ref 6–24)
Basophils Absolute: 0.2 10*3/uL (ref 0.0–0.2)
Basos: 1 %
Bilirubin Total: 2.6 mg/dL — ABNORMAL HIGH (ref 0.0–1.2)
CO2: 20 mmol/L (ref 20–29)
Calcium: 10.2 mg/dL (ref 8.7–10.2)
Chloride: 99 mmol/L (ref 96–106)
Creatinine, Ser: 0.86 mg/dL (ref 0.76–1.27)
EOS (ABSOLUTE): 0.3 10*3/uL (ref 0.0–0.4)
Eos: 2 %
Ferritin: 788 ng/mL — ABNORMAL HIGH (ref 30–400)
Globulin, Total: 3.2 g/dL (ref 1.5–4.5)
Glucose: 151 mg/dL — ABNORMAL HIGH (ref 65–99)
Hematocrit: 40.4 % (ref 37.5–51.0)
Hemoglobin: 13.7 g/dL (ref 13.0–17.7)
Immature Grans (Abs): 0.1 10*3/uL (ref 0.0–0.1)
Immature Granulocytes: 1 %
Lymphocytes Absolute: 4.9 10*3/uL — ABNORMAL HIGH (ref 0.7–3.1)
Lymphs: 36 %
MCH: 28.8 pg (ref 26.6–33.0)
MCHC: 33.9 g/dL (ref 31.5–35.7)
MCV: 85 fL (ref 79–97)
Monocytes Absolute: 1 10*3/uL — ABNORMAL HIGH (ref 0.1–0.9)
Monocytes: 7 %
NRBC: 2 % — ABNORMAL HIGH (ref 0–0)
Neutrophils Absolute: 7.1 10*3/uL — ABNORMAL HIGH (ref 1.4–7.0)
Neutrophils: 53 %
Platelets: 452 10*3/uL — ABNORMAL HIGH (ref 150–450)
Potassium: 4.7 mmol/L (ref 3.5–5.2)
RBC: 4.76 x10E6/uL (ref 4.14–5.80)
RDW: 16.8 % — ABNORMAL HIGH (ref 11.6–15.4)
Retic Ct Pct: 8 % — ABNORMAL HIGH (ref 0.6–2.6)
Sodium: 134 mmol/L (ref 134–144)
Total Protein: 7.6 g/dL (ref 6.0–8.5)
Vit D, 25-Hydroxy: 40.1 ng/mL (ref 30.0–100.0)
WBC: 13.5 10*3/uL — ABNORMAL HIGH (ref 3.4–10.8)
eGFR: 109 mL/min/{1.73_m2} (ref >59)

## 2020-04-11 LAB — MICROALBUMIN / CREATININE URINE RATIO
Creatinine, Urine: 56.9 mg/dL
Microalb/Creat Ratio: 10 mg/g creat (ref 0–29)
Microalbumin, Urine: 5.5 ug/mL

## 2020-04-11 LAB — FRUCTOSAMINE: Fructosamine: 434 umol/L — ABNORMAL HIGH (ref 0–285)

## 2020-07-17 ENCOUNTER — Ambulatory Visit (INDEPENDENT_AMBULATORY_CARE_PROVIDER_SITE_OTHER): Payer: Medicaid Other | Admitting: Family Medicine

## 2020-07-17 ENCOUNTER — Other Ambulatory Visit: Payer: Self-pay

## 2020-07-17 ENCOUNTER — Encounter: Payer: Self-pay | Admitting: Family Medicine

## 2020-07-17 VITALS — BP 157/85 | HR 63 | Temp 97.3°F | Ht 66.0 in | Wt 167.0 lb

## 2020-07-17 DIAGNOSIS — E559 Vitamin D deficiency, unspecified: Secondary | ICD-10-CM

## 2020-07-17 DIAGNOSIS — G8929 Other chronic pain: Secondary | ICD-10-CM

## 2020-07-17 DIAGNOSIS — I10 Essential (primary) hypertension: Secondary | ICD-10-CM

## 2020-07-17 DIAGNOSIS — E119 Type 2 diabetes mellitus without complications: Secondary | ICD-10-CM

## 2020-07-17 DIAGNOSIS — M7918 Myalgia, other site: Secondary | ICD-10-CM

## 2020-07-17 DIAGNOSIS — D5700 Hb-SS disease with crisis, unspecified: Secondary | ICD-10-CM

## 2020-07-17 LAB — POCT URINALYSIS DIPSTICK
Bilirubin, UA: NEGATIVE
Blood, UA: NEGATIVE
Glucose, UA: NEGATIVE
Ketones, UA: NEGATIVE
Leukocytes, UA: NEGATIVE
Nitrite, UA: NEGATIVE
Protein, UA: NEGATIVE
Spec Grav, UA: 1.01 (ref 1.010–1.025)
Urobilinogen, UA: 0.2 E.U./dL
pH, UA: 6 (ref 5.0–8.0)

## 2020-07-17 LAB — POCT GLYCOSYLATED HEMOGLOBIN (HGB A1C)
HbA1c POC (<> result, manual entry): 5.3 % (ref 4.0–5.6)
HbA1c, POC (controlled diabetic range): 5.3 % (ref 0.0–7.0)
HbA1c, POC (prediabetic range): 5.3 % — AB (ref 5.7–6.4)
Hemoglobin A1C: 5.3 % (ref 4.0–5.6)

## 2020-07-17 LAB — GLUCOSE, POCT (MANUAL RESULT ENTRY): POC Glucose: 130 mg/dl — AB (ref 70–99)

## 2020-07-17 MED ORDER — OXYCODONE HCL 15 MG PO TABS: 15.0000 mg | ORAL_TABLET | ORAL | 0 refills | Status: DC | PRN

## 2020-07-17 MED ORDER — IBUPROFEN 600 MG PO TABS: 600.0000 mg | ORAL_TABLET | Freq: Three times a day (TID) | ORAL | 5 refills | Status: DC | PRN

## 2020-07-18 LAB — DRUG SCREEN 764883 11+OXYCO+ALC+CRT-BUND
Amphetamines, Urine: NEGATIVE ng/mL
BENZODIAZ UR QL: NEGATIVE ng/mL
Barbiturate: NEGATIVE ng/mL
Cannabinoid Quant, Ur: NEGATIVE ng/mL
Cocaine (Metabolite): NEGATIVE ng/mL
Creatinine: 42.8 mg/dL (ref 20.0–300.0)
Ethanol: NEGATIVE %
Meperidine: NEGATIVE ng/mL
Methadone Screen, Urine: NEGATIVE ng/mL
OPIATE SCREEN URINE: NEGATIVE ng/mL
Oxycodone/Oxymorphone, Urine: NEGATIVE ng/mL
Phencyclidine: NEGATIVE ng/mL
Propoxyphene: NEGATIVE ng/mL
Tramadol: NEGATIVE ng/mL
pH, Urine: 5.7 (ref 4.5–8.9)

## 2020-07-18 LAB — FRUCTOSAMINE: Fructosamine: 428 umol/L — ABNORMAL HIGH (ref 0–285)

## 2020-07-30 NOTE — Progress Notes (Signed)
Patient [name]   Established Patient Office Visit  Subjective:  Patient ID: Gregory Bryant, male    DOB: 1975/02/22  Age: 45 y.o. MRN: [id number]  CC:  Chief Complaint  Patient presents with   Follow-up    3 month follow up; refill oxycodone     HPI Gregory Bryant is a very pleasant 45 year old male with a medical history significant for sickle cell disease, chronic pain syndrome, opiate dependence and tolerance, type 2 diabetes mellitus, and history of anemia of chronic disease presents for 66-monthfollow-up of chronic conditions.  Patient says that he has been doing very well and is without complaint on today.  He says that he has not been exercising or following a carbohydrate modified diet.  His meals are typically prepared by his wife.  Patient has been taking all of his medications consistently.  He states that he is not having pain on today.  However, he is requesting his oxycodone.  Patient has very will control sickle cell disease with infrequent pain crises.  He is followed closely by hematology every 6 months.  He is up-to-date with his eye exam.  He currently denies any headache, chest pain, blurry vision, urinary symptoms, fever, chills, nausea, vomiting, or diarrhea.  No polyuria, polydipsia, or polyphagia. Past Medical History:  Diagnosis Date   Diabetes mellitus without complication (HCC)    Sickle cell anemia (HCC)     Past Surgical History:  Procedure Laterality Date   CHOLECYSTECTOMY      Family History  Problem Relation Age of Onset   Diabetes Mother     Social History   Socioeconomic History   Marital status: Married    Spouse name: Not on file   Number of children: Not on file   Years of education: Not on file   Highest education level: Not on file  Occupational History   Not on file  Tobacco Use   Smoking status: Never   Smokeless tobacco: Never  Vaping Use   Vaping Use: Never used  Substance  and Sexual Activity   Alcohol use: No   Drug use: No   Sexual activity: Not on file  Other Topics Concern   Not on file  Social History Narrative   Not on file   Social Determinants of Health   Financial Resource Strain: Not on file  Food Insecurity: Not on file  Transportation Needs: Not on file  Physical Activity: Not on file  Stress: Not on file  Social Connections: Not on file  Intimate Partner Violence: Not on file    Outpatient Medications Prior to Visit  Medication Sig Dispense Refill   folic acid (FOLVITE) 1 MG tablet Take 1 tablet (1 mg total) by mouth daily. 30 tablet 11   glipiZIDE (GLUCOTROL) 10 MG tablet Take 1 tablet (10 mg total) by mouth daily before breakfast. 30 tablet 5   lisinopril (ZESTRIL) 2.5 MG tablet Take 1 tablet (2.5 mg total) by mouth daily. 30 tablet 5   ondansetron (ZOFRAN ODT) 8 MG disintegrating tablet Take 1 tablet (8 mg total) by mouth every 8 (eight) hours as needed for nausea or vomiting. 20 tablet 0   Vitamin D, Ergocalciferol, (DRISDOL) 1.25 MG (50000 UNIT) CAPS capsule Take 1 capsule (50,000 Units total) by mouth every 7 (seven) days. 30 capsule 1   ibuprofen (ADVIL) 600 MG tablet Take 1 tablet (600 mg total) by mouth every 8 (eight) hours as needed. 30 tablet 2  metFORMIN (GLUCOPHAGE) 1000 MG tablet Take 1 tablet (1,000 mg total) by mouth 2 (two) times daily with a meal. 180 tablet 2   oxyCODONE (ROXICODONE) 15 MG immediate release tablet Take 1 tablet (15 mg total) by mouth every 4 (four) hours as needed for up to 15 days for pain. 60 tablet 0   No facility-administered medications prior to visit.    Allergies  Allergen Reactions   Aspirin Other (See Comments)    unknown   Sulfa Antibiotics Other (See Comments)    unknown    ROS Review of Systems  Constitutional: Negative.   HENT: Negative.    Eyes: Negative.   Respiratory: Negative.    Cardiovascular: Negative.   Gastrointestinal: Negative.   Endocrine: Negative.  Negative  for polydipsia, polyphagia and polyuria.  Genitourinary: Negative.   Musculoskeletal:  Positive for back pain.  Skin: Negative.   Neurological: Negative.   Hematological: Negative.   Psychiatric/Behavioral: Negative.       Objective:    Physical Exam Constitutional:      Appearance: Normal appearance.  HENT:     Head: Normocephalic.     Mouth/Throat:     Mouth: Mucous membranes are moist.  Eyes:     Pupils: Pupils are equal, round, and reactive to light.  Cardiovascular:     Rate and Rhythm: Normal rate and regular rhythm.  Pulmonary:     Effort: Pulmonary effort is normal.  Abdominal:     General: Bowel sounds are normal.  Musculoskeletal:        General: Normal range of motion.  Skin:    General: Skin is warm.  Neurological:     General: No focal deficit present.     Mental Status: He is alert. Mental status is at baseline.  Psychiatric:        Mood and Affect: Mood normal.        Thought Content: Thought content normal.        Judgment: Judgment normal.    BP (!) 157/85   Pulse 63   Temp (!) 97.3 F (36.3 C)   Ht 5' 6"  (1.676 m)   Wt 167 lb (75.8 kg)   SpO2 97%   BMI 26.95 kg/m  Wt Readings from Last 3 Encounters:  07/17/20 167 lb (75.8 kg)  04/10/20 172 lb (78 kg)  03/23/20 165 lb 5.5 oz (75 kg)     Health Maintenance Due  Topic Date Due   COVID-19 Vaccine (1) Never done   Pneumococcal Vaccine 71-7 Years old (1 - PCV) Never done   Meningococcal B Vaccine (1 of 4 - Increased Risk Bexsero 2-dose series) 05/28/1985    There are no preventive care reminders to display for this patient.  No results found for: TSH Lab Results  Component Value Date   WBC 13.5 (H) 04/10/2020   HGB 13.7 04/10/2020   HCT 40.4 04/10/2020   MCV 85 04/10/2020   PLT 452 (H) 04/10/2020   Lab Results  Component Value Date   NA 134 04/10/2020   K 4.7 04/10/2020   CO2 20 04/10/2020   GLUCOSE 151 (H) 04/10/2020   BUN 12 04/10/2020   CREATININE 0.86 04/10/2020    BILITOT 2.6 (H) 04/10/2020   ALKPHOS 118 04/10/2020   AST 32 04/10/2020   ALT 40 04/10/2020   PROT 7.6 04/10/2020   ALBUMIN 4.4 04/10/2020   CALCIUM 10.2 04/10/2020   ANIONGAP 14 03/23/2020   EGFR 109 04/10/2020   No results found for: CHOL No  results found for: HDL No results found for: LDLCALC No results found for: TRIG No results found for: CHOLHDL Lab Results  Component Value Date   HGBA1C 5.3 07/17/2020   HGBA1C 5.3 07/17/2020   HGBA1C 5.3 (A) 07/17/2020   HGBA1C 5.3 07/17/2020      Assessment & Plan:   Problem List Items Addressed This Visit       Endocrine   Diabetes ([place]) - Primary   Relevant Orders   Urinalysis Dipstick (Completed)   HgB A1c (Completed)   Glucose (CBG) (Completed)   Fructosamine (Completed)     Other   Sickle cell anemia with pain (HCC)   Relevant Medications   oxyCODONE (ROXICODONE) 15 MG immediate release tablet   ibuprofen (ADVIL) 600 MG tablet   Chronic musculoskeletal pain   Relevant Medications   oxyCODONE (ROXICODONE) 15 MG immediate release tablet   ibuprofen (ADVIL) 600 MG tablet   Other Relevant Orders   [id number] 11+Oxyco+Alc+Crt-Bund (Completed)   Other Visit Diagnoses     Essential hypertension       Relevant Orders   Urinalysis Dipstick (Completed)   Vitamin D deficiency           Meds ordered this encounter  Medications   oxyCODONE (ROXICODONE) 15 MG immediate release tablet    Sig: Take 1 tablet (15 mg total) by mouth every 4 (four) hours as needed for up to 15 days for pain.    Dispense:  60 tablet    Refill:  0    Order Specific Question:   Supervising Provider    Answer:   Tresa Garter [[id number]]   ibuprofen (ADVIL) 600 MG tablet    Sig: Take 1 tablet (600 mg total) by mouth every 8 (eight) hours as needed.    Dispense:  30 tablet    Refill:  5    Order Specific Question:   Supervising Provider    Answer:   Angelica Chessman E [[id number]]   0. Type 2 diabetes mellitus without complication,  without long-term current use of insulin (HCC) - Urinalysis Dipstick - HgB A1c - Glucose (CBG) - Fructosamine  2. Essential hypertension - Continue medication, monitor blood pressure at home. Continue DASH diet.  Reminder to go to the ER if any CP, SOB, nausea, dizziness, severe HA, changes vision/speech, left arm numbness and tingling and jaw pain.   - Urinalysis Dipstick  3. Sickle cell anemia with pain (HCC) Reviewed PDMP substance reporting system prior to prescribing opiate medications. No inconsistencies noted.   - oxyCODONE (ROXICODONE) 15 MG immediate release tablet; Take 1 tablet (15 mg total) by mouth every 4 (four) hours as needed for up to 15 days for pain.  Dispense: 60 tablet; Refill: 0 - ibuprofen (ADVIL) 600 MG tablet; Take 1 tablet (600 mg total) by mouth every 8 (eight) hours as needed.  Dispense: 30 tablet; Refill: 5  4. Chronic musculoskeletal pain - [id number] 11+Oxyco+Alc+Crt-Bund  5. Vitamin D deficiency No medication changes warranted at this time  Follow-up: Return in about 3 months (around 10/17/2020) for sickle cell anemia.     Donia Pounds  APRN, MSN, FNP-C Patient [facility] [address] [place], [place] [address] [phone]

## 2020-08-24 ENCOUNTER — Inpatient Hospital Stay (HOSPITAL_COMMUNITY)
Admission: EM | Admit: 2020-08-24 | Discharge: 2020-08-26 | DRG: 177 | Disposition: A | Discharge status: Home / Self Care | Payer: Medicaid Other | Attending: Internal Medicine | Admitting: Internal Medicine

## 2020-08-24 ENCOUNTER — Other Ambulatory Visit: Payer: Self-pay

## 2020-08-24 DIAGNOSIS — J4520 Mild intermittent asthma, uncomplicated: Secondary | ICD-10-CM | POA: Diagnosis present

## 2020-08-24 DIAGNOSIS — Z28310 Unvaccinated for covid-19: Secondary | ICD-10-CM

## 2020-08-24 DIAGNOSIS — Q8901 Asplenia (congenital): Secondary | ICD-10-CM

## 2020-08-24 DIAGNOSIS — Z833 Family history of diabetes mellitus: Secondary | ICD-10-CM

## 2020-08-24 DIAGNOSIS — I10 Essential (primary) hypertension: Secondary | ICD-10-CM | POA: Diagnosis present

## 2020-08-24 DIAGNOSIS — Z882 Allergy status to sulfonamides status: Secondary | ICD-10-CM

## 2020-08-24 DIAGNOSIS — D730 Hyposplenism: Secondary | ICD-10-CM

## 2020-08-24 DIAGNOSIS — U071 COVID-19: Principal | ICD-10-CM | POA: Diagnosis present

## 2020-08-24 DIAGNOSIS — Z79899 Other long term (current) drug therapy: Secondary | ICD-10-CM

## 2020-08-24 DIAGNOSIS — Z7984 Long term (current) use of oral hypoglycemic drugs: Secondary | ICD-10-CM

## 2020-08-24 DIAGNOSIS — Z886 Allergy status to analgesic agent status: Secondary | ICD-10-CM

## 2020-08-24 DIAGNOSIS — G894 Chronic pain syndrome: Secondary | ICD-10-CM | POA: Diagnosis present

## 2020-08-24 DIAGNOSIS — D5700 Hb-SS disease with crisis, unspecified: Secondary | ICD-10-CM

## 2020-08-24 DIAGNOSIS — Z862 Personal history of diseases of the blood and blood-forming organs and certain disorders involving the immune mechanism: Secondary | ICD-10-CM

## 2020-08-24 DIAGNOSIS — E119 Type 2 diabetes mellitus without complications: Secondary | ICD-10-CM

## 2020-08-24 DIAGNOSIS — F1120 Opioid dependence, uncomplicated: Secondary | ICD-10-CM | POA: Diagnosis present

## 2020-08-24 DIAGNOSIS — D57219 Sickle-cell/Hb-C disease with crisis, unspecified: Secondary | ICD-10-CM | POA: Diagnosis present

## 2020-08-24 NOTE — ED Triage Notes (Signed)
Patient arrives complaining of cough with minimal green sputum, fever, and SCC. Patient complaining of back and leg pain. Patient denies chest pain, shortness of breath, N/V/D. COVID exposure recently.

## 2020-08-25 ENCOUNTER — Encounter (HOSPITAL_COMMUNITY): Payer: Self-pay | Admitting: Emergency Medicine

## 2020-08-25 ENCOUNTER — Emergency Department (HOSPITAL_COMMUNITY): Payer: Medicaid Other

## 2020-08-25 DIAGNOSIS — D57219 Sickle-cell/Hb-C disease with crisis, unspecified: Secondary | ICD-10-CM | POA: Diagnosis present

## 2020-08-25 DIAGNOSIS — F1120 Opioid dependence, uncomplicated: Secondary | ICD-10-CM | POA: Diagnosis present

## 2020-08-25 DIAGNOSIS — Z28310 Unvaccinated for covid-19: Secondary | ICD-10-CM | POA: Diagnosis not present

## 2020-08-25 DIAGNOSIS — Z79899 Other long term (current) drug therapy: Secondary | ICD-10-CM | POA: Diagnosis not present

## 2020-08-25 DIAGNOSIS — J4520 Mild intermittent asthma, uncomplicated: Secondary | ICD-10-CM | POA: Diagnosis present

## 2020-08-25 DIAGNOSIS — Q8901 Asplenia (congenital): Secondary | ICD-10-CM | POA: Diagnosis not present

## 2020-08-25 DIAGNOSIS — Z886 Allergy status to analgesic agent status: Secondary | ICD-10-CM | POA: Diagnosis not present

## 2020-08-25 DIAGNOSIS — Z7984 Long term (current) use of oral hypoglycemic drugs: Secondary | ICD-10-CM | POA: Diagnosis not present

## 2020-08-25 DIAGNOSIS — Z882 Allergy status to sulfonamides status: Secondary | ICD-10-CM | POA: Diagnosis not present

## 2020-08-25 DIAGNOSIS — E119 Type 2 diabetes mellitus without complications: Secondary | ICD-10-CM | POA: Diagnosis present

## 2020-08-25 DIAGNOSIS — I10 Essential (primary) hypertension: Secondary | ICD-10-CM | POA: Diagnosis present

## 2020-08-25 DIAGNOSIS — G894 Chronic pain syndrome: Secondary | ICD-10-CM | POA: Diagnosis present

## 2020-08-25 DIAGNOSIS — Z833 Family history of diabetes mellitus: Secondary | ICD-10-CM | POA: Diagnosis not present

## 2020-08-25 DIAGNOSIS — U071 COVID-19: Secondary | ICD-10-CM | POA: Diagnosis present

## 2020-08-25 DIAGNOSIS — D5700 Hb-SS disease with crisis, unspecified: Secondary | ICD-10-CM

## 2020-08-25 LAB — CBC WITH DIFFERENTIAL/PLATELET
Abs Immature Granulocytes: 0.38 10*3/uL — ABNORMAL HIGH (ref 0.00–0.07)
Basophils Absolute: 0.1 10*3/uL (ref 0.0–0.1)
Basophils Relative: 1 %
Eosinophils Absolute: 0 10*3/uL (ref 0.0–0.5)
Eosinophils Relative: 0 %
HCT: 34.4 % — ABNORMAL LOW (ref 39.0–52.0)
Hemoglobin: 12.4 g/dL — ABNORMAL LOW (ref 13.0–17.0)
Immature Granulocytes: 3 %
Lymphocytes Relative: 10 %
Lymphs Abs: 1.4 10*3/uL (ref 0.7–4.0)
MCH: 29.4 pg (ref 26.0–34.0)
MCHC: 36 g/dL (ref 30.0–36.0)
MCV: 81.5 fL (ref 80.0–100.0)
Monocytes Absolute: 2.4 10*3/uL — ABNORMAL HIGH (ref 0.1–1.0)
Monocytes Relative: 18 %
Neutro Abs: 8.9 10*3/uL — ABNORMAL HIGH (ref 1.7–7.7)
Neutrophils Relative %: 68 %
Platelets: 331 10*3/uL (ref 150–400)
RBC: 4.22 MIL/uL (ref 4.22–5.81)
RDW: 15.5 % (ref 11.5–15.5)
WBC: 13.2 10*3/uL — ABNORMAL HIGH (ref 4.0–10.5)
nRBC: 4.2 % — ABNORMAL HIGH (ref 0.0–0.2)

## 2020-08-25 LAB — COMPREHENSIVE METABOLIC PANEL
ALT: 46 U/L — ABNORMAL HIGH (ref 0–44)
AST: 52 U/L — ABNORMAL HIGH (ref 15–41)
Albumin: 4.5 g/dL (ref 3.5–5.0)
Alkaline Phosphatase: 100 U/L (ref 38–126)
Anion gap: 11 (ref 5–15)
BUN: 10 mg/dL (ref 6–20)
CO2: 22 mmol/L (ref 22–32)
Calcium: 9.3 mg/dL (ref 8.9–10.3)
Chloride: 102 mmol/L (ref 98–111)
Creatinine, Ser: 0.88 mg/dL (ref 0.61–1.24)
GFR, Estimated: >60 mL/min (ref >60)
Glucose, Bld: 211 mg/dL — ABNORMAL HIGH (ref 70–99)
Potassium: 4.1 mmol/L (ref 3.5–5.1)
Sodium: 135 mmol/L (ref 135–145)
Total Bilirubin: 3.8 mg/dL — ABNORMAL HIGH (ref 0.3–1.2)
Total Protein: 8.6 g/dL — ABNORMAL HIGH (ref 6.5–8.1)

## 2020-08-25 LAB — CREATININE, SERUM
Creatinine, Ser: 0.83 mg/dL (ref 0.61–1.24)
GFR, Estimated: >60 mL/min (ref >60)

## 2020-08-25 LAB — CBC
HCT: 31.5 % — ABNORMAL LOW (ref 39.0–52.0)
Hemoglobin: 11.5 g/dL — ABNORMAL LOW (ref 13.0–17.0)
MCH: 29.3 pg (ref 26.0–34.0)
MCHC: 36.5 g/dL — ABNORMAL HIGH (ref 30.0–36.0)
MCV: 80.2 fL (ref 80.0–100.0)
Platelets: 339 10*3/uL (ref 150–400)
RBC: 3.93 MIL/uL — ABNORMAL LOW (ref 4.22–5.81)
RDW: 15.2 % (ref 11.5–15.5)
WBC: 13.1 10*3/uL — ABNORMAL HIGH (ref 4.0–10.5)
nRBC: 3.5 % — ABNORMAL HIGH (ref 0.0–0.2)

## 2020-08-25 LAB — RETICULOCYTES
Immature Retic Fract: 20.7 % — ABNORMAL HIGH (ref 2.3–15.9)
RBC.: 4.26 MIL/uL (ref 4.22–5.81)
Retic Count, Absolute: 289.3 10*3/uL — ABNORMAL HIGH (ref 19.0–186.0)
Retic Ct Pct: 6.8 % — ABNORMAL HIGH (ref 0.4–3.1)

## 2020-08-25 LAB — RESP PANEL BY RT-PCR (FLU A&B, COVID) ARPGX2
Influenza A by PCR: NEGATIVE
Influenza B by PCR: NEGATIVE
SARS Coronavirus 2 by RT PCR: POSITIVE — AB

## 2020-08-25 LAB — HIV ANTIBODY (ROUTINE TESTING W REFLEX): HIV Screen 4th Generation wRfx: NONREACTIVE

## 2020-08-25 LAB — GLUCOSE, CAPILLARY: Glucose-Capillary: 172 mg/dL — ABNORMAL HIGH (ref 70–99)

## 2020-08-25 LAB — CBG MONITORING, ED
Glucose-Capillary: 249 mg/dL — ABNORMAL HIGH (ref 70–99)
Glucose-Capillary: 232 mg/dL — ABNORMAL HIGH (ref 70–99)

## 2020-08-25 MED ORDER — FOLIC ACID 1 MG PO TABS
1.0000 mg | ORAL_TABLET | Freq: Every day | ORAL | Status: DC
  Administered 2020-08-25 – 2020-08-26 (×2): 1 mg via ORAL
  Filled 2020-08-25 (×2): qty 1

## 2020-08-25 MED ORDER — GLIPIZIDE 10 MG PO TABS
10.0000 mg | ORAL_TABLET | Freq: Every day | ORAL | Status: DC
  Administered 2020-08-26: 10 mg via ORAL
  Filled 2020-08-25 (×2): qty 1

## 2020-08-25 MED ORDER — HYDROCOD POLST-CPM POLST ER 10-8 MG/5ML PO SUER
5.0000 mL | Freq: Two times a day (BID) | ORAL | Status: DC | PRN
Start: 2020-08-25 — End: 2020-08-26

## 2020-08-25 MED ORDER — POLYETHYLENE GLYCOL 3350 17 G PO PACK: 17.0000 g | PACK | Freq: Every day | ORAL | Status: DC | PRN

## 2020-08-25 MED ORDER — METOPROLOL TARTRATE 5 MG/5ML IV SOLN: 5.0000 mg | Freq: Four times a day (QID) | INTRAVENOUS | Status: DC | PRN

## 2020-08-25 MED ORDER — ENOXAPARIN SODIUM 40 MG/0.4ML IJ SOSY
40.0000 mg | PREFILLED_SYRINGE | Freq: Every day | INTRAMUSCULAR | Status: DC
  Administered 2020-08-25 – 2020-08-26 (×2): 40 mg via SUBCUTANEOUS
  Filled 2020-08-25 (×2): qty 0.4

## 2020-08-25 MED ORDER — HYDROMORPHONE HCL 2 MG/ML IJ SOLN
2.0000 mg | INTRAMUSCULAR | Status: AC
  Administered 2020-08-25: 2 mg via SUBCUTANEOUS
  Filled 2020-08-25: qty 1

## 2020-08-25 MED ORDER — TRAZODONE HCL 50 MG PO TABS: 25.0000 mg | ORAL_TABLET | Freq: Every evening | ORAL | Status: DC | PRN

## 2020-08-25 MED ORDER — ONDANSETRON HCL 4 MG PO TABS: 4.0000 mg | ORAL_TABLET | Freq: Four times a day (QID) | ORAL | Status: DC | PRN

## 2020-08-25 MED ORDER — LACTATED RINGERS IV SOLN: INTRAVENOUS | Status: DC

## 2020-08-25 MED ORDER — INSULIN ASPART 100 UNIT/ML IJ SOLN
0.0000 [IU] | Freq: Three times a day (TID) | INTRAMUSCULAR | Status: DC
  Administered 2020-08-25 (×2): 5 [IU] via SUBCUTANEOUS
  Administered 2020-08-26: 8 [IU] via SUBCUTANEOUS
  Administered 2020-08-26: 3 [IU] via SUBCUTANEOUS
  Filled 2020-08-25: qty 0.15

## 2020-08-25 MED ORDER — HYDROMORPHONE HCL 2 MG/ML IJ SOLN
2.0000 mg | INTRAMUSCULAR | Status: AC
  Administered 2020-08-25: 2 mg via SUBCUTANEOUS
  Filled 2020-08-25 (×2): qty 1

## 2020-08-25 MED ORDER — ONDANSETRON HCL 4 MG/2ML IJ SOLN: 4.0000 mg | Freq: Four times a day (QID) | INTRAMUSCULAR | Status: DC | PRN

## 2020-08-25 MED ORDER — IBUPROFEN 200 MG PO TABS
600.0000 mg | ORAL_TABLET | Freq: Three times a day (TID) | ORAL | Status: DC | PRN
  Administered 2020-08-25: 600 mg via ORAL
  Filled 2020-08-25: qty 3

## 2020-08-25 MED ORDER — METFORMIN HCL 500 MG PO TABS
1000.0000 mg | ORAL_TABLET | Freq: Two times a day (BID) | ORAL | Status: DC
  Administered 2020-08-25 – 2020-08-26 (×2): 1000 mg via ORAL
  Filled 2020-08-25 (×2): qty 2

## 2020-08-25 MED ORDER — GUAIFENESIN-DM 100-10 MG/5ML PO SYRP: 10.0000 mL | ORAL_SOLUTION | ORAL | Status: DC | PRN

## 2020-08-25 MED ORDER — HYDROMORPHONE HCL 2 MG/ML IJ SOLN: 2.0000 mg | INTRAMUSCULAR | Status: DC | PRN

## 2020-08-25 MED ORDER — LISINOPRIL 5 MG PO TABS
2.5000 mg | ORAL_TABLET | Freq: Every day | ORAL | Status: DC
  Administered 2020-08-25 – 2020-08-26 (×2): 2.5 mg via ORAL
  Filled 2020-08-25 (×2): qty 1

## 2020-08-25 MED ORDER — ONDANSETRON 8 MG PO TBDP: 8.0000 mg | ORAL_TABLET | Freq: Three times a day (TID) | ORAL | Status: DC | PRN

## 2020-08-25 MED ORDER — OXYCODONE HCL 5 MG PO TABS
15.0000 mg | ORAL_TABLET | ORAL | Status: DC | PRN
  Filled 2020-08-25: qty 3

## 2020-08-25 MED ORDER — NIRMATRELVIR/RITONAVIR (PAXLOVID)TABLET
3.0000 | ORAL_TABLET | Freq: Two times a day (BID) | ORAL | Status: DC
  Administered 2020-08-25 – 2020-08-26 (×3): 3 via ORAL
  Filled 2020-08-25: qty 30

## 2020-08-25 NOTE — Progress Notes (Signed)
Gregory Bryant is a 45 year old male with a medical history significant for sickle cell disease type [place], chronic pain syndrome, type 2 diabetes mellitus, history of mild intermittent asthma, and history of hypertension was admitted overnight with COVID-19 infection in the setting of sickle cell crisis. On admission, patient was noted to be COVID-positive with a temperature.  He does not have an oxygen requirement at this time.  Oxygen saturation did drop to 80s, recovered spontaneously. Patient denies any pain at this time.  He is awaiting bed placement.  Donia Pounds  APRN, MSN, FNP-C Patient [facility] [address] [place], [place] [address] [phone]

## 2020-08-25 NOTE — H&P (Addendum)
History and Physical    Gregory Bryant [id number] DOB: 03/03/1975 DOA: 08/24/2020  PCP: Dorena Dew, FNP  Patient coming from: Home  I have personally briefly reviewed patient's old medical records in [place]  Chief Complaint: Fever  HPI: Gregory Bryant is a 45 y.o. male with medical history significant of sickle-C disease, type 2 diabetes, hypertension who reports of 2-day history of congestion.  He developed fever overnight and came to the hospital.  He reports cough.  He is unvaccinated for COVID.  He also reports myalgias and nasal congestion.  For his sickle cell disease he is usually on Oxy IR 15 mg every 4 hours as needed.  He denies significant increase in sickle cell pain. ED Course: In the ED the patient was noted to be COVID-positive. with temp.  He had a negative chest x-ray and no oxygen requirement, though he did drop his sats to the high 80s once but recovered spontaneously.  He was given his usual meds for sickle cell pain crisis which improved that.  Given his comorbidities, it was felt he should be admitted and begun on Paxlovid and monitor for worsening status.  Review of Systems: As per HPI otherwise 10 point review of systems negative.   Past Medical History:  Diagnosis Date   Diabetes mellitus without complication ([place])    Sickle cell anemia ([place])     Past Surgical History:  Procedure Laterality Date   CHOLECYSTECTOMY       reports that he has never smoked. He has never used smokeless tobacco. He reports that he does not drink alcohol and does not use drugs.  Allergies  Allergen Reactions   Aspirin Other (See Comments)    unknown   Sulfa Antibiotics Other (See Comments)    unknown    Family History  Problem Relation Age of Onset   Diabetes Mother     Prior to Admission medications   Medication Sig Start Date End Date Taking? Authorizing Provider  folic acid (FOLVITE) 1 MG tablet Take 1 tablet (1 mg total) by mouth daily.  01/10/20  Yes Dorena Dew, FNP  glipiZIDE (GLUCOTROL) 10 MG tablet Take 1 tablet (10 mg total) by mouth daily before breakfast. 01/10/20  Yes Dorena Dew, FNP  ibuprofen (ADVIL) 600 MG tablet Take 1 tablet (600 mg total) by mouth every 8 (eight) hours as needed. 07/17/20  Yes Dorena Dew, FNP  lisinopril (ZESTRIL) 2.5 MG tablet Take 1 tablet (2.5 mg total) by mouth daily. 04/10/20  Yes Dorena Dew, FNP  metFORMIN (GLUCOPHAGE) 1000 MG tablet Take 1 tablet (1,000 mg total) by mouth 2 (two) times daily with a meal. 01/10/20 08/25/20 Yes Dorena Dew, FNP  ondansetron (ZOFRAN ODT) 8 MG disintegrating tablet Take 1 tablet (8 mg total) by mouth every 8 (eight) hours as needed for nausea or vomiting. 01/10/20  Yes Dorena Dew, FNP  oxyCODONE (ROXICODONE) 15 MG immediate release tablet Take 1 tablet (15 mg total) by mouth every 4 (four) hours as needed for up to 15 days for pain. 07/17/20 08/25/20 Yes Dorena Dew, FNP  Vitamin D, Ergocalciferol, (DRISDOL) 1.25 MG (50000 UNIT) CAPS capsule Take 1 capsule (50,000 Units total) by mouth every 7 (seven) days. Patient taking differently: Take 50,000 Units by mouth every 7 (seven) days. Mondays 01/10/20  Yes Dorena Dew, FNP  cetirizine (ZYRTEC) 10 MG tablet Take 1 tablet (10 mg total) by mouth daily. Patient not taking: Reported on 11/30/2019 07/08/18 11/30/19  Lanae Boast, FNP  fluticasone [facility]) 50 MCG/ACT nasal spray Place 2 sprays into both nostrils daily. Patient not taking: Reported on 11/30/2019 07/08/18 11/30/19  Lanae Boast, FNP    Physical Exam: Vitals:   08/25/20 0630 08/25/20 0645 08/25/20 0701 08/25/20 0702  BP: 137/74 (!) 145/97 (!) 153/89   Pulse: (!) 102 99 (!) 106   Resp: 17 10 (!) 26   Temp:    100.2 F (37.9 C)  TempSrc:    Oral  SpO2: (!) 89% 90% 98%   Weight:      Height:        Constitutional: NAD, calm, comfortable Vitals:   08/25/20 0630 08/25/20 0645 08/25/20 0701 08/25/20 0702   BP: 137/74 (!) 145/97 (!) 153/89   Pulse: (!) 102 99 (!) 106   Resp: 17 10 (!) 26   Temp:    100.2 F (37.9 C)  TempSrc:    Oral  SpO2: (!) 89% 90% 98%   Weight:      Height:       Eyes: PERRL, lids and conjunctivae normal ENMT: Mucous membranes are moist. Posterior pharynx clear of any exudate or lesions.Normal dentition.  Neck: normal, supple, no masses, no thyromegaly Respiratory: clear to auscultation bilaterally, no wheezing, no crackles. Normal respiratory effort. No accessory muscle use.  Cardiovascular: Regular rate and rhythm, no murmurs / rubs / gallops. No extremity edema. 2+ pedal pulses.  Abdomen: no tenderness, no masses palpated. No hepatosplenomegaly. Bowel sounds positive.  Musculoskeletal: no clubbing / cyanosis. No joint deformity upper and lower extremities. Good ROM, no contractures. Normal muscle tone.  Skin: no rashes, lesions, ulcers. No induration Neurologic: Grossly normal Psychiatric: Normal judgment and insight. Alert and oriented x 3. Normal mood.   Labs on Admission: I have personally reviewed following labs and imaging studies  CBC: Recent Labs  Lab 08/25/20 0109  WBC 13.2*  NEUTROABS 8.9*  HGB 12.4*  HCT 34.4*  MCV 81.5  PLT [id number]   Basic Metabolic Panel: Recent Labs  Lab 08/25/20 0109  NA 135  K 4.1  CL 102  CO2 22  GLUCOSE 211*  BUN 10  CREATININE 0.88  CALCIUM 9.3   GFR: Estimated Creatinine Clearance: 103.6 mL/min (by C-G formula based on SCr of 0.88 mg/dL). Liver Function Tests: Recent Labs  Lab 08/25/20 0109  AST 52*  ALT 46*  ALKPHOS 100  BILITOT 3.8*  PROT 8.6*  ALBUMIN 4.5    Anemia Panel: Recent Labs    08/25/20 0109  RETICCTPCT 6.8*   Urine analysis:    Component Value Date/Time   COLORURINE YELLOW 03/23/2020 [place] 03/23/2020 1355   LABSPEC 1.012 03/23/2020 1355   PHURINE 6.0 03/23/2020 1355   GLUCOSEU 150 (A) 03/23/2020 1355   HGBUR NEGATIVE 03/23/2020 1355   BILIRUBINUR neg  07/17/2020 1043   KETONESUR 5 (A) 03/23/2020 1355   PROTEINUR Negative 07/17/2020 1043   PROTEINUR NEGATIVE 03/23/2020 1355   UROBILINOGEN 0.2 07/17/2020 1043   UROBILINOGEN 1.0 09/20/2014 1908   NITRITE neg 07/17/2020 1043   NITRITE NEGATIVE 03/23/2020 1355   LEUKOCYTESUR Negative 07/17/2020 1043   LEUKOCYTESUR NEGATIVE 03/23/2020 1355    Radiological Exams on Admission: DG Chest 2 View  Result Date: 08/25/2020 CLINICAL DATA:  Cough, fever EXAM: CHEST - 2 VIEW COMPARISON:  11/30/2019 FINDINGS: Lungs are clear. No pneumothorax or pleural effusion. Cardiac size within normal limits. Pulmonary vascularity is normal. Irregular sclerotic pattern within the humeral heads bilaterally is in keeping with multiple bone  infarcts in the setting of underlying sickle cell disease. IMPRESSION: No active cardiopulmonary disease. Electronically Signed   By: Fidela Salisbury MD   On: 08/25/2020 01:09     Assessment/Plan Active Problems:   Diabetes ([place])   Functional asplenia   Asthma, mild intermittent   Sickle cell pain crisis ([place])   COVID  COVID positive Given comorbidities, unvaccinated status, asplenia will admit. Given no chest x-ray findings or oxygen requirements at present we will begin on Paxlovid Daily labs Consider addition of steroids should oxygen needs or x-rays become more impressive.  [place] disease with functional asplenia and mild pain crisis We will continue his home Oxy IR 15 mg every 4 hours as needed.   IV meds as needed IV hydration  Asthma, mild intermittent Monitor and add meds as necessary  Diabetes Continue home Glucotrol and metformin Sign scale insulin  Hypertension Continue lisinopril  DVT prophylaxis: Lovenox SQ Code Status: Full code  Family Communication: Patient at bedside Disposition Plan: Home Consults called: None Admission status: Inpatient patient is inpatient due to need for IV pain medications and close monitoring.     Donnamae Jude MD [facility]  Hospitalist  If 7PM-7AM, please contact night-coverage 08/25/2020, 8:59 AM

## 2020-08-25 NOTE — ED Notes (Signed)
Pt given lunch tray.

## 2020-08-25 NOTE — Plan of Care (Signed)
  Problem: Education: Goal: Knowledge of General Education information will improve Description: Including pain rating scale, medication(s)/side effects and non-pharmacologic comfort measures Outcome: Progressing   Problem: Health Behavior/Discharge Planning: Goal: Ability to manage health-related needs will improve Outcome: Progressing   Problem: Clinical Measurements: Goal: Ability to maintain clinical measurements within normal limits will improve Outcome: Progressing Goal: Will remain free from infection Outcome: Progressing Goal: Diagnostic test results will improve Outcome: Progressing   Problem: Education: Goal: Knowledge of risk factors and measures for prevention of condition will improve Outcome: Progressing   Problem: Coping: Goal: Psychosocial and spiritual needs will be supported Outcome: Progressing   Problem: Respiratory: Goal: Will maintain a patent airway Outcome: Progressing Goal: Complications related to the disease process, condition or treatment will be avoided or minimized Outcome: Progressing

## 2020-08-25 NOTE — ED Provider Notes (Signed)
[facility] DEPT Provider Note   CSN: [id number] Arrival date & time: 08/24/20  2310     History Chief Complaint  Patient presents with   Fever   Sickle Cell Pain Crisis    Gregory Bryant is a 45 y.o. male.  Patient with history of sickle cell ([place]), T2DM, AVN, to ED with pain in his back and legs, c/w sickle cell crisis. He also reports fever, cough, mild nasal congestion x 2-3 days. Reports his son has similar URI symptoms in the past week and tested negative for COVID when evaluated. No nausea, vomiting, diarrhea. No urinary symptoms.   The history is provided by the patient. No language interpreter was used.  Fever Associated symptoms: congestion, cough and myalgias   Associated symptoms: no chest pain, no chills, no dysuria, no headaches, no nausea, no rash, no sore throat and no vomiting   Sickle Cell Pain Crisis Associated symptoms: congestion, cough and fever   Associated symptoms: no chest pain, no headaches, no nausea, no shortness of breath, no sore throat and no vomiting       Past Medical History:  Diagnosis Date   Diabetes mellitus without complication ([name])    Sickle cell anemia ([place])     Patient Active Problem List   Diagnosis Date Noted   Uncontrolled type 2 diabetes mellitus with hyperglycemia ([name]) 08/15/2019   Constipation due to pain medication 07/14/2019   Sickle cell pain crisis ([name]) 01/11/2018   Sickle cell anemia with pain ([name]) 07/22/2017   Chronic musculoskeletal pain 03/30/2017   Avascular necrosis ([place]) 03/30/2017   Asthma, mild intermittent 03/30/2017   Diabetes ([name]) 02/07/2017   Functional asplenia 02/07/2017   Sickle cell disease, type [name] ([name]) 02/07/2017    Past Surgical History:  Procedure Laterality Date   CHOLECYSTECTOMY         Family History  Problem Relation Age of Onset   Diabetes Mother     Social History   Tobacco Use   Smoking status: Never   Smokeless tobacco: Never  Vaping  Use   Vaping Use: Never used  Substance Use Topics   Alcohol use: No   Drug use: No    Home Medications Prior to Admission medications   Medication Sig Start Date End Date Taking? Authorizing Provider  folic acid (FOLVITE) 1 MG tablet Take 1 tablet (1 mg total) by mouth daily. 01/10/20   Dorena Dew, FNP  glipiZIDE (GLUCOTROL) 10 MG tablet Take 1 tablet (10 mg total) by mouth daily before breakfast. 01/10/20   Dorena Dew, FNP  ibuprofen (ADVIL) 600 MG tablet Take 1 tablet (600 mg total) by mouth every 8 (eight) hours as needed. 07/17/20   Dorena Dew, FNP  lisinopril (ZESTRIL) 2.5 MG tablet Take 1 tablet (2.5 mg total) by mouth daily. 04/10/20   Dorena Dew, FNP  metFORMIN (GLUCOPHAGE) 1000 MG tablet Take 1 tablet (1,000 mg total) by mouth 2 (two) times daily with a meal. 01/10/20 04/09/20  Dorena Dew, FNP  ondansetron (ZOFRAN ODT) 8 MG disintegrating tablet Take 1 tablet (8 mg total) by mouth every 8 (eight) hours as needed for nausea or vomiting. 01/10/20   Dorena Dew, FNP  oxyCODONE (ROXICODONE) 15 MG immediate release tablet Take 1 tablet (15 mg total) by mouth every 4 (four) hours as needed for up to 15 days for pain. 07/17/20 08/01/20  Dorena Dew, FNP  Vitamin D, Ergocalciferol, (DRISDOL) 1.25 MG (50000 UNIT) CAPS capsule Take 1  capsule (50,000 Units total) by mouth every 7 (seven) days. 01/10/20   Dorena Dew, FNP  cetirizine (ZYRTEC) 10 MG tablet Take 1 tablet (10 mg total) by mouth daily. Patient not taking: Reported on 11/30/2019 07/08/18 11/30/19  Lanae Boast, FNP  fluticasone [facility]) 50 MCG/ACT nasal spray Place 2 sprays into both nostrils daily. Patient not taking: Reported on 11/30/2019 07/08/18 11/30/19  Lanae Boast, FNP    Allergies    Aspirin and Sulfa antibiotics  Review of Systems   Review of Systems  Constitutional:  Positive for fever. Negative for chills.  HENT:  Positive for congestion. Negative for sore throat.    Respiratory:  Positive for cough. Negative for shortness of breath.   Cardiovascular: Negative.  Negative for chest pain.  Gastrointestinal: Negative.  Negative for abdominal pain, nausea and vomiting.  Genitourinary:  Negative for decreased urine volume and dysuria.  Musculoskeletal:  Positive for back pain and myalgias.  Skin: Negative.  Negative for rash.  Neurological: Negative.  Negative for weakness and headaches.   Physical Exam Updated Vital Signs BP 123/78 (BP Location: Left Arm)   Pulse (!) 105   Temp 99.7 F (37.6 C) (Oral)   Resp 16   Ht '5\' 6"'$  (1.676 m)   Wt 77.1 kg   SpO2 92%   BMI 27.44 kg/m   Physical Exam Vitals and nursing note reviewed.  Constitutional:      Appearance: He is well-developed.  HENT:     Head: Normocephalic.  Cardiovascular:     Rate and Rhythm: Normal rate and regular rhythm.     Heart sounds: No murmur heard. Pulmonary:     Effort: Pulmonary effort is normal.     Breath sounds: Normal breath sounds. No wheezing, rhonchi or rales.  Abdominal:     General: Bowel sounds are normal.     Palpations: Abdomen is soft.     Tenderness: There is no abdominal tenderness. There is no guarding or rebound.  Musculoskeletal:        General: Normal range of motion.     Cervical back: Normal range of motion and neck supple.  Skin:    General: Skin is warm and dry.  Neurological:     General: No focal deficit present.     Mental Status: He is alert and oriented to person, place, and time.    ED Results / Procedures / Treatments   Labs (all labs ordered are listed, but only abnormal results are displayed) Labs Reviewed  COMPREHENSIVE METABOLIC PANEL  CBC WITH DIFFERENTIAL/PLATELET  RETICULOCYTES   Results for orders placed or performed during the hospital encounter of 08/24/20  Resp Panel by RT-PCR (Flu A&B, Covid) Nasopharyngeal Swab   Specimen: Nasopharyngeal Swab; Nasopharyngeal(NP) swabs in vial transport medium  Result Value Ref Range    SARS Coronavirus 2 by RT PCR POSITIVE (A) NEGATIVE   Influenza A by PCR NEGATIVE NEGATIVE   Influenza B by PCR NEGATIVE NEGATIVE  Comprehensive metabolic panel  Result Value Ref Range   Sodium 135 135 - 145 mmol/L   Potassium 4.1 3.5 - 5.1 mmol/L   Chloride 102 98 - 111 mmol/L   CO2 22 22 - 32 mmol/L   Glucose, Bld 211 (H) 70 - 99 mg/dL   BUN 10 6 - 20 mg/dL   Creatinine, Ser 0.88 0.61 - 1.24 mg/dL   Calcium 9.3 8.9 - 10.3 mg/dL   Total Protein 8.6 (H) 6.5 - 8.1 g/dL   Albumin 4.5 3.5 - 5.0 g/dL  AST 52 (H) 15 - 41 U/L   ALT 46 (H) 0 - 44 U/L   Alkaline Phosphatase 100 38 - 126 U/L   Total Bilirubin 3.8 (H) 0.3 - 1.2 mg/dL   GFR, Estimated >60 >60 mL/min   Anion gap 11 5 - 15  CBC with Differential  Result Value Ref Range   WBC 13.2 (H) 4.0 - 10.5 K/uL   RBC 4.22 4.22 - 5.81 MIL/uL   Hemoglobin 12.4 (L) 13.0 - 17.0 g/dL   HCT 34.4 (L) 39.0 - 52.0 %   MCV 81.5 80.0 - 100.0 fL   MCH 29.4 26.0 - 34.0 pg   MCHC 36.0 30.0 - 36.0 g/dL   RDW 15.5 11.5 - 15.5 %   Platelets 331 150 - 400 K/uL   nRBC 4.2 (H) 0.0 - 0.2 %   Neutrophils Relative % 68 %   Neutro Abs 8.9 (H) 1.7 - 7.7 K/uL   Lymphocytes Relative 10 %   Lymphs Abs 1.4 0.7 - 4.0 K/uL   Monocytes Relative 18 %   Monocytes Absolute 2.4 (H) 0.1 - 1.0 K/uL   Eosinophils Relative 0 %   Eosinophils Absolute 0.0 0.0 - 0.5 K/uL   Basophils Relative 1 %   Basophils Absolute 0.1 0.0 - 0.1 K/uL   Immature Granulocytes 3 %   Abs Immature Granulocytes 0.38 (H) 0.00 - 0.07 K/uL   Reactive, Benign Lymphocytes PRESENT    Polychromasia PRESENT    Sickle Cells PRESENT    Target Cells PRESENT   Reticulocytes  Result Value Ref Range   Retic Ct Pct 6.8 (H) 0.4 - 3.1 %   RBC. 4.26 4.22 - 5.81 MIL/uL   Retic Count, Absolute 289.3 (H) 19.0 - 186.0 K/uL   Immature Retic Fract 20.7 (H) 2.3 - 15.9 %    EKG None  Radiology DG Chest 2 View  Result Date: 08/25/2020 CLINICAL DATA:  Cough, fever EXAM: CHEST - 2 VIEW COMPARISON:   11/30/2019 FINDINGS: Lungs are clear. No pneumothorax or pleural effusion. Cardiac size within normal limits. Pulmonary vascularity is normal. Irregular sclerotic pattern within the humeral heads bilaterally is in keeping with multiple bone infarcts in the setting of underlying sickle cell disease. IMPRESSION: No active cardiopulmonary disease. Electronically Signed   By: Fidela Salisbury MD   On: 08/25/2020 01:09    Procedures Procedures   Medications Ordered in ED Medications - No data to display  ED Course  I have reviewed the triage vital signs and the nursing notes.  Pertinent labs & imaging results that were available during my care of the patient were reviewed by me and considered in my medical decision making (see chart for details).    MDM Rules/Calculators/A&P                           Patient with h/o sickle cell disease having back and leg pain typical of sickle cell crises. Also with fever, cough, congestion.Marland Kitchen  He is overall well appearing. In NAD. Infrequent ED visits for sickle cell pain noted. VSS, mild tachycardia. No respiratory symptoms or compromise.   CXR negative. Labs c/w previous, including elevated total bilirubin/enzymes. He is COVID positive.   Pain is well controlled with 2 doses of pain medication. On 2 occasions he has dropped his O2 saturation to 88%, and 84%, which rebounded without intervention to the low 90's. He denies SOB. Oxygen placed at 2L will continue to monitor.   Plan to  admit sickle cell with fever, COVID positive, diabetic, considered at significantly increased risk. Hospitalist paged.   Final Clinical Impression(s) / ED Diagnoses Final diagnoses:  None   COVID infection H/o sickle cell disease Febrile illness  Rx / DC Orders ED Discharge Orders     None        Dennie Bible [id number]    Delora Fuel, MD [id number] [phone]

## 2020-08-26 DIAGNOSIS — U071 COVID-19: Principal | ICD-10-CM

## 2020-08-26 LAB — COMPREHENSIVE METABOLIC PANEL
ALT: 51 U/L — ABNORMAL HIGH (ref 0–44)
AST: 64 U/L — ABNORMAL HIGH (ref 15–41)
Albumin: 4 g/dL (ref 3.5–5.0)
Alkaline Phosphatase: 104 U/L (ref 38–126)
Anion gap: 7 (ref 5–15)
BUN: 11 mg/dL (ref 6–20)
CO2: 26 mmol/L (ref 22–32)
Calcium: 9.1 mg/dL (ref 8.9–10.3)
Chloride: 100 mmol/L (ref 98–111)
Creatinine, Ser: 0.85 mg/dL (ref 0.61–1.24)
GFR, Estimated: >60 mL/min (ref >60)
Glucose, Bld: 151 mg/dL — ABNORMAL HIGH (ref 70–99)
Potassium: 4.2 mmol/L (ref 3.5–5.1)
Sodium: 133 mmol/L — ABNORMAL LOW (ref 135–145)
Total Bilirubin: 3.6 mg/dL — ABNORMAL HIGH (ref 0.3–1.2)
Total Protein: 8.2 g/dL — ABNORMAL HIGH (ref 6.5–8.1)

## 2020-08-26 LAB — CBC WITH DIFFERENTIAL/PLATELET
Abs Immature Granulocytes: 0.09 10*3/uL — ABNORMAL HIGH (ref 0.00–0.07)
Basophils Absolute: 0.1 10*3/uL (ref 0.0–0.1)
Basophils Relative: 1 %
Eosinophils Absolute: 0 10*3/uL (ref 0.0–0.5)
Eosinophils Relative: 0 %
HCT: 35 % — ABNORMAL LOW (ref 39.0–52.0)
Hemoglobin: 12.6 g/dL — ABNORMAL LOW (ref 13.0–17.0)
Immature Granulocytes: 1 %
Lymphocytes Relative: 28 %
Lymphs Abs: 3.6 10*3/uL (ref 0.7–4.0)
MCH: 29 pg (ref 26.0–34.0)
MCHC: 36 g/dL (ref 30.0–36.0)
MCV: 80.6 fL (ref 80.0–100.0)
Monocytes Absolute: 1 10*3/uL (ref 0.1–1.0)
Monocytes Relative: 8 %
Neutro Abs: 7.9 10*3/uL — ABNORMAL HIGH (ref 1.7–7.7)
Neutrophils Relative %: 62 %
Platelets: 333 10*3/uL (ref 150–400)
RBC: 4.34 MIL/uL (ref 4.22–5.81)
RDW: 15.4 % (ref 11.5–15.5)
WBC: 12.7 10*3/uL — ABNORMAL HIGH (ref 4.0–10.5)
nRBC: 6.9 % — ABNORMAL HIGH (ref 0.0–0.2)

## 2020-08-26 LAB — PHOSPHORUS: Phosphorus: 3.9 mg/dL (ref 2.5–4.6)

## 2020-08-26 LAB — FERRITIN: Ferritin: 7476 ng/mL — ABNORMAL HIGH (ref 24–336)

## 2020-08-26 LAB — GLUCOSE, CAPILLARY
Glucose-Capillary: 173 mg/dL — ABNORMAL HIGH (ref 70–99)
Glucose-Capillary: 268 mg/dL — ABNORMAL HIGH (ref 70–99)

## 2020-08-26 LAB — D-DIMER, QUANTITATIVE: D-Dimer, Quant: 2.79 ug/mL-FEU — ABNORMAL HIGH (ref 0.00–0.50)

## 2020-08-26 LAB — C-REACTIVE PROTEIN: CRP: 0.8 mg/dL (ref <1.0)

## 2020-08-26 LAB — MAGNESIUM: Magnesium: 2 mg/dL (ref 1.7–2.4)

## 2020-08-26 MED ORDER — NIRMATRELVIR/RITONAVIR (PAXLOVID)TABLET: 3.0000 | ORAL_TABLET | Freq: Two times a day (BID) | ORAL | 0 refills | Status: AC

## 2020-08-26 MED ORDER — OXYCODONE HCL 15 MG PO TABS: 15.0000 mg | ORAL_TABLET | ORAL | 0 refills | Status: DC | PRN

## 2020-08-26 NOTE — Discharge Summary (Signed)
Physician Discharge Summary  Gregory Bryant [id number] DOB: 18-Jun-1975 DOA: 08/24/2020  PCP: Dorena Dew, FNP  Admit date: 08/24/2020  Discharge date: 08/26/2020  Discharge Diagnoses:  Active Problems:   Diabetes ([place])   Functional asplenia   Asthma, mild intermittent   Sickle cell pain crisis ([place])   COVID   Discharge Condition: Stable  Disposition:   Follow-up Information     Dorena Dew, FNP Follow up in 2 week(s).   Specialty: Family Medicine Contact information: [address] [place] [address] [phone]                Pt is discharged home in good condition and is to follow up with Dorena Dew, FNP this week to have labs evaluated. Gregory Bryant is instructed to increase activity slowly and balance with rest for the next few days, and use prescribed medication to complete treatment of pain  Diet: Regular Wt Readings from Last 3 Encounters:  08/25/20 73.7 kg  07/17/20 75.8 kg  04/10/20 78 kg    History of present illness:  Gregory Bryant is a 45 year old male with a medical history significant for sickle cell disease type [place], type 2 diabetes mellitus, chronic pain syndrome, opiate dependence and tolerance, and hypertension who reports a 2-day history of congestion.  He also developed fever overnight and came to the hospital.  He reports of persistent cough.  He is unvaccinated for COVID-19.  He also endorses myalgias and nasal congestion.  For his sickle cell disease he is usually on oxycodone immediate release 15 mg every 4 hours as needed.  He denies any significant increases in pain related to sickle cell disease.  ER course: In the ED, patient was noted to be COVID-19 positive with a temperature.  He had a negative chest x-ray and no oxygen requirement though he did drop his saturation into the 80s once but recovered spontaneously.  He was given his usual meds for sickle cell pain crisis, which improved his  pain.  Given his comorbidities, it was felt that he should be admitted and began Paxil of it and monitor for worsening status.  Hospital Course:  COVID-19 infection: Patient was admitted after a 2-day history of persistent cough, nasal congestion, and chest congestion.  Patient was also febrile on admission.  He tested positive for COVID-19.  PACs limited was initiated and patient has received 2 doses.  He does not have an oxygen requirement and his saturations 100% on RA.  Patient does not have any chest pain, fatigue, or shortness of breath.  He has been afebrile over the past 24 hours.  Patient will discharge home today on Paxil.  Paxil that x3 doses was sent to patient's pharmacy.  Discussed medication regimen at length.  Patient will also isolate at home until 08/29/2020.  Sickle cell disease with pain: Patient has chronic pain related to sickle cell disease.  Pain mostly occurs in his lower back.  Patient's pain was completely resolved after oxycodone 15 mg.  Patient will discharge home with his usual oxycodone.  Oxycodone 15 mg every 4 hours #60 was sent to patient's pharmacy.  Reviewed PDMP substance reporting system prior to prescribing opiate medications, no inconsistencies noted.  Type 2 diabetes mellitus: Patient has very well-controlled type 2 diabetes mellitus.  He will resume home medication regimen.  Also, patient advised to restart carbohydrate modified diet.  He expressed understanding.  He will follow-up in clinic in 2 weeks to repeat complete metabolic panel and  complete blood count.  Patient was admitted for sickle cell pain crisis and managed appropriately with IVF, IV Dilaudid via PCA and IV Toradol, as well as other adjunct therapies per sickle cell pain management protocols.  Patient is afebrile, oxygen saturations 100% on RA, he is alert, oriented, and ambulating without assistance.  Patient will discharge home in a hemodynamically stable condition.  He is aware of all upcoming  appointments.  Gregory Bryant will follow-up with PCP within 1 week of this discharge. Gregory Bryant was counseled extensively about nonpharmacologic means of pain management, patient verbalized understanding and was appreciative of  the care received during this admission.   We discussed the need for good hydration, monitoring of hydration status, avoidance of heat, cold, stress, and infection triggers. We discussed the need to be adherent with taking home medications. Patient was reminded of the need to seek medical attention immediately if any symptom of bleeding, anemia, or infection occurs.  Discharge Exam: Vitals:   08/25/20 2300 08/26/20 0708  BP: (!) 142/93 115/68  Pulse: 72 97  Resp: 18 15  Temp: 98.6 F (37 C) 99.9 F (37.7 C)  SpO2: 99% 100%   Vitals:   08/25/20 2100 08/25/20 2200 08/25/20 2300 08/26/20 0708  BP: (!) 137/98 130/83 (!) 142/93 115/68  Pulse: 75 78 72 97  Resp: '17 20 18 15  '$ Temp:   98.6 F (37 C) 99.9 F (37.7 C)  TempSrc:   Oral Oral  SpO2: 100% 100% 99% 100%  Weight:   73.7 kg   Height:   '5\' 7"'$  (1.702 m)     General appearance : Awake, alert, not in any distress. Speech Clear. Not toxic looking HEENT: Atraumatic and Normocephalic, pupils equally reactive to light and accomodation Neck: Supple, no JVD. No cervical lymphadenopathy.  Chest: Good air entry bilaterally, no added sounds  CVS: S1 S2 regular, no murmurs.  Abdomen: Bowel sounds present, Non tender and not distended with no guarding, rigidity or rebound. Extremities: B/L Lower Ext shows no edema, both legs are warm to touch Neurology: Awake alert, and oriented X 3, CN II-XII intact, Non focal Skin: No Rash  Discharge Instructions  Discharge Instructions     Discharge patient   Complete by: As directed    Discharge disposition: 01-Home or Self Care   Discharge patient date: 08/26/2020      Allergies as of 08/26/2020       Reactions   Aspirin Other (See Comments)   unknown   Sulfa Antibiotics  Other (See Comments)   unknown        Medication List     TAKE these medications    folic acid 1 MG tablet Commonly known as: FOLVITE Take 1 tablet (1 mg total) by mouth daily.   glipiZIDE 10 MG tablet Commonly known as: GLUCOTROL Take 1 tablet (10 mg total) by mouth daily before breakfast.   ibuprofen 600 MG tablet Commonly known as: ADVIL Take 1 tablet (600 mg total) by mouth every 8 (eight) hours as needed.   lisinopril 2.5 MG tablet Commonly known as: ZESTRIL Take 1 tablet (2.5 mg total) by mouth daily.   metFORMIN 1000 MG tablet Commonly known as: GLUCOPHAGE Take 1 tablet (1,000 mg total) by mouth 2 (two) times daily with a meal.   nirmatrelvir/ritonavir EUA Tabs Commonly known as: PAXLOVID Take 3 tablets by mouth 2 (two) times daily for 5 days. Patient GFR is greater thank 60 Take nirmatrelvir (150 mg) two tablets twice daily for 5 days and ritonavir (  100 mg) one tablet twice daily for 3 days. Patient received 2 days as an inpatient.   ondansetron 8 MG disintegrating tablet Commonly known as: Zofran ODT Take 1 tablet (8 mg total) by mouth every 8 (eight) hours as needed for nausea or vomiting.   oxyCODONE 15 MG immediate release tablet Commonly known as: ROXICODONE Take 1 tablet (15 mg total) by mouth every 4 (four) hours as needed for up to 15 days for pain.       ASK your doctor about these medications    Vitamin D (Ergocalciferol) 1.25 MG (50000 UNIT) Caps capsule Commonly known as: DRISDOL Take 1 capsule (50,000 Units total) by mouth every 7 (seven) days.        The results of significant diagnostics from this hospitalization (including imaging, microbiology, ancillary and laboratory) are listed below for reference.    Significant Diagnostic Studies: DG Chest 2 View  Result Date: 08/25/2020 CLINICAL DATA:  Cough, fever EXAM: CHEST - 2 VIEW COMPARISON:  11/30/2019 FINDINGS: Lungs are clear. No pneumothorax or pleural effusion. Cardiac size within  normal limits. Pulmonary vascularity is normal. Irregular sclerotic pattern within the humeral heads bilaterally is in keeping with multiple bone infarcts in the setting of underlying sickle cell disease. IMPRESSION: No active cardiopulmonary disease. Electronically Signed   By: Fidela Salisbury MD   On: 08/25/2020 01:09    Microbiology: Recent Results (from the past 240 hour(s))  Resp Panel by RT-PCR (Flu A&B, Covid) Nasopharyngeal Swab     Status: Abnormal   Collection Time: 08/25/20  5:24 AM   Specimen: Nasopharyngeal Swab; Nasopharyngeal(NP) swabs in vial transport medium  Result Value Ref Range Status   SARS Coronavirus 2 by RT PCR POSITIVE (A) NEGATIVE Final    Comment: RESULT CALLED TO, READ BACK BY AND VERIFIED WITH: Leonette Most, RN 08/25/20 [phone] KDS (NOTE) SARS-CoV-2 target nucleic acids are DETECTED.  The SARS-CoV-2 RNA is generally detectable in upper respiratory specimens during the acute phase of infection. Positive results are indicative of the presence of the identified virus, but do not rule out bacterial infection or co-infection with other pathogens not detected by the test. Clinical correlation with patient history and other diagnostic information is necessary to determine patient infection status. The expected result is Negative.  Fact Sheet for Patients: EntrepreneurPulse.com.au  Fact Sheet for Healthcare Providers: IncredibleEmployment.be  This test is not yet approved or cleared by the [place] FDA and  has been authorized for detection and/or diagnosis of SARS-CoV-2 by FDA under an Emergency Use Authorization (EUA).  This EUA will remain in effect (meaning this test can  be used) for the duration of  the COVID-19 declaration under Section 564(b)(1) of the Act, 21 U.S.C. section 360bbb-3(b)(1), unless the authorization is terminated or revoked sooner.     Influenza A by PCR NEGATIVE NEGATIVE Final   Influenza B by  PCR NEGATIVE NEGATIVE Final    Comment: (NOTE) The Xpert Xpress SARS-CoV-2/FLU/RSV plus assay is intended as an aid in the diagnosis of influenza from Nasopharyngeal swab specimens and should not be used as a sole basis for treatment. Nasal washings and aspirates are unacceptable for Xpert Xpress SARS-CoV-2/FLU/RSV testing.  Fact Sheet for Patients: EntrepreneurPulse.com.au  Fact Sheet for Healthcare Providers: IncredibleEmployment.be  This test is not yet approved or cleared by the [place] FDA and has been authorized for detection and/or diagnosis of SARS-CoV-2 by FDA under an Emergency Use Authorization (EUA). This EUA will remain in effect (meaning this test can be used)  for the duration of the COVID-19 declaration under Section 564(b)(1) of the Act, 21 U.S.C. section 360bbb-3(b)(1), unless the authorization is terminated or revoked.  Performed at [facility], [facility] [address], [place], [place] [address]      Labs: Basic Metabolic Panel: Recent Labs  Lab 08/25/20 0109 08/25/20 1139 08/26/20 0453  NA 135  --  133*  K 4.1  --  4.2  CL 102  --  100  CO2 22  --  26  GLUCOSE 211*  --  151*  BUN 10  --  11  CREATININE 0.88 0.83 0.85  CALCIUM 9.3  --  9.1  MG  --   --  2.0  PHOS  --   --  3.9   Liver Function Tests: Recent Labs  Lab 08/25/20 0109 08/26/20 0453  AST 52* 64*  ALT 46* 51*  ALKPHOS 100 104  BILITOT 3.8* 3.6*  PROT 8.6* 8.2*  ALBUMIN 4.5 4.0   No results for input(s): LIPASE, AMYLASE in the last 168 hours. No results for input(s): AMMONIA in the last 168 hours. CBC: Recent Labs  Lab 08/25/20 0109 08/25/20 1139 08/26/20 0453  WBC 13.2* 13.1* 12.7*  NEUTROABS 8.9*  --  7.9*  HGB 12.4* 11.5* 12.6*  HCT 34.4* 31.5* 35.0*  MCV 81.5 80.2 80.6  PLT 331 339 333   Cardiac Enzymes: No results for input(s): CKTOTAL, CKMB, CKMBINDEX, TROPONINI in the last 168 hours. BNP: Invalid input(s):  POCBNP CBG: Recent Labs  Lab 08/25/20 1043 08/25/20 1643 08/25/20 2338 08/26/20 0749 08/26/20 1110  GLUCAP 249* 232* 172* 173* 268*    Time coordinating discharge: 35 minutes  Signed:  Donia Pounds  APRN, MSN, FNP-C [facility] [address] [place], [place] [address] [phone]  [facility] 08/26/2020, 12:28 PM

## 2020-08-27 LAB — HEMOGLOBIN A1C
Hgb A1c MFr Bld: 5.6 % (ref 4.8–5.6)
Mean Plasma Glucose: 114 mg/dL

## 2020-10-16 ENCOUNTER — Ambulatory Visit: Payer: Medicaid Other | Admitting: Family Medicine

## 2020-10-18 ENCOUNTER — Encounter: Payer: Self-pay | Admitting: Family Medicine

## 2020-10-18 ENCOUNTER — Ambulatory Visit (INDEPENDENT_AMBULATORY_CARE_PROVIDER_SITE_OTHER): Payer: Self-pay | Admitting: Family Medicine

## 2020-10-18 ENCOUNTER — Other Ambulatory Visit: Payer: Self-pay

## 2020-10-18 VITALS — BP 154/92 | HR 98 | Temp 98.1°F | Ht 66.0 in | Wt 167.4 lb

## 2020-10-18 DIAGNOSIS — D5700 Hb-SS disease with crisis, unspecified: Secondary | ICD-10-CM

## 2020-10-18 DIAGNOSIS — I10 Essential (primary) hypertension: Secondary | ICD-10-CM

## 2020-10-18 DIAGNOSIS — E119 Type 2 diabetes mellitus without complications: Secondary | ICD-10-CM

## 2020-10-18 DIAGNOSIS — Z23 Encounter for immunization: Secondary | ICD-10-CM

## 2020-10-18 LAB — POCT URINALYSIS DIP (CLINITEK)
Bilirubin, UA: NEGATIVE
Blood, UA: NEGATIVE
Glucose, UA: NEGATIVE mg/dL
Ketones, POC UA: NEGATIVE mg/dL
Leukocytes, UA: NEGATIVE
Nitrite, UA: NEGATIVE
POC PROTEIN,UA: NEGATIVE
Spec Grav, UA: 1.02 (ref 1.010–1.025)
Urobilinogen, UA: 1 E.U./dL
pH, UA: 5.5 (ref 5.0–8.0)

## 2020-10-18 NOTE — Progress Notes (Signed)
[facility]   Established Patient Office Visit  Subjective:  Patient ID: Gregory Bryant, male    DOB: [birth year]  Age: 45 y.o. MRN: [id number]  CC:  Chief Complaint  Patient presents with   Follow-up    Pt is here today for his 3 month follow up. Pt states over all he is doing ok and has no concerns or issues to discuss.    HPI Gregory Bryant is a 45 year old male with a medical history significant for sickle cell disease, type 2 diabetes mellitus, opiate dependence and tolerance, and history of anemia of chronic disease presents for follow-up of chronic conditions.  Patient has very well-controlled sickle cell disease with infrequent pain crises.  He is followed by [facility] hematology and was previously seen on 10/11/2020.  Patient is not on any disease modifying agents at this time.  He is not having any pain on today.  He denies any headache, chest pain, shortness of breath, dizziness, urinary symptoms, nausea, vomiting, or diarrhea.  Patient has had no sick contacts, or recent travel.  He was previously diagnosed with COVID-19 infection, he is not vaccinated and does not plan to receive COVID-vaccine.  Patient has a history of type 2 diabetes mellitus.  Fructosamine has been consistently elevated.  Patient has been taking prescribed medications without interruption.  He does not check blood sugar at home.  He is not following a low carbohydrate diet divided over small meals.  He currently denies any polydipsia, polyuria, or polyphagia.  Past Medical History:  Diagnosis Date   Diabetes mellitus without complication (HCC)    Sickle cell anemia (HCC)     Past Surgical History:  Procedure Laterality Date   CHOLECYSTECTOMY      Family History  Problem Relation Age of Onset   Diabetes Mother     Social History   Socioeconomic History   Marital status: Married    Spouse name: Not on file   Number of children: Not on file    Years of education: Not on file   Highest education level: Not on file  Occupational History   Not on file  Tobacco Use   Smoking status: Never   Smokeless tobacco: Never  Vaping Use   Vaping Use: Never used  Substance and Sexual Activity   Alcohol use: No   Drug use: No   Sexual activity: Not Currently  Other Topics Concern   Not on file  Social History Narrative   Not on file   Social Determinants of Health   Financial Resource Strain: Not on file  Food Insecurity: Not on file  Transportation Needs: Not on file  Physical Activity: Not on file  Stress: Not on file  Social Connections: Not on file  Intimate Partner Violence: Not on file    Outpatient Medications Prior to Visit  Medication Sig Dispense Refill   folic acid (FOLVITE) 1 MG tablet Take 1 tablet (1 mg total) by mouth daily. 30 tablet 11   glipiZIDE (GLUCOTROL) 10 MG tablet Take 1 tablet (10 mg total) by mouth daily before breakfast. 30 tablet 5   ibuprofen (ADVIL) 600 MG tablet Take 1 tablet (600 mg total) by mouth every 8 (eight) hours as needed. 30 tablet 5   lisinopril (ZESTRIL) 2.5 MG tablet Take 1 tablet (2.5 mg total) by mouth daily. 30 tablet 5   ondansetron (ZOFRAN ODT) 8 MG disintegrating tablet Take 1 tablet (8 mg total) by mouth every  8 (eight) hours as needed for nausea or vomiting. 20 tablet 0   Vitamin D, Ergocalciferol, (DRISDOL) 1.25 MG (50000 UNIT) CAPS capsule Take 1 capsule (50,000 Units total) by mouth every 7 (seven) days. (Patient taking differently: Take 50,000 Units by mouth every 7 (seven) days. Mondays) 30 capsule 1   metFORMIN (GLUCOPHAGE) 1000 MG tablet Take 1 tablet (1,000 mg total) by mouth 2 (two) times daily with a meal. 180 tablet 2   oxyCODONE (ROXICODONE) 15 MG immediate release tablet Take 1 tablet (15 mg total) by mouth every 4 (four) hours as needed for up to 15 days for pain. 60 tablet 0   No facility-administered medications prior to visit.    Allergies  Allergen  Reactions   Aspirin Other (See Comments)    unknown   Sulfa Antibiotics Other (See Comments)    unknown    ROS Review of Systems  Constitutional: Negative.   HENT: Negative.    Eyes: Negative.   Respiratory: Negative.    Cardiovascular: Negative.   Gastrointestinal: Negative.   Endocrine: Negative for polydipsia, polyphagia and polyuria.  Genitourinary: Negative.   Musculoskeletal: Negative.   Skin: Negative.      Objective:    Physical Exam Constitutional:      Appearance: Normal appearance.  Eyes:     Pupils: Pupils are equal, round, and reactive to light.  Cardiovascular:     Rate and Rhythm: Normal rate and regular rhythm.     Pulses: Normal pulses.  Pulmonary:     Effort: Pulmonary effort is normal.  Abdominal:     General: Abdomen is flat. Bowel sounds are normal.  Skin:    General: Skin is warm.  Neurological:     General: No focal deficit present.     Mental Status: He is alert.  Psychiatric:        Mood and Affect: Mood normal.        Behavior: Behavior normal.        Thought Content: Thought content normal.        Judgment: Judgment normal.    BP (!) 154/92   Pulse 98   Temp 98.1 F (36.7 C)   Ht _0  (1.676 m)   Wt 167 lb 6.4 oz (75.9 kg)   SpO2 98%   BMI 27.02 kg/m  Wt Readings from Last 3 Encounters:  10/18/20 167 lb 6.4 oz (75.9 kg)  08/25/20 162 lb 7.7 oz (73.7 kg)  07/17/20 167 lb (75.8 kg)     Health Maintenance Due  Topic Date Due   COVID-19 Vaccine (1) Never done   Meningococcal B Vaccine (1 of 4 - Increased Risk Bexsero 2-dose series) 05/28/1985   INFLUENZA VACCINE  08/27/2020   FOOT EXAM  10/10/2020    There are no preventive care reminders to display for this patient.  No results found for: TSH Lab Results  Component Value Date   WBC 12.7 (H) 08/26/2020   HGB 12.6 (L) 08/26/2020   HCT 35.0 (L) 08/26/2020   MCV 80.6 08/26/2020   PLT 333 08/26/2020   Lab Results  Component Value Date   NA 133 (L) 08/26/2020   K  4.2 08/26/2020   CO2 26 08/26/2020   GLUCOSE 151 (H) 08/26/2020   BUN 11 08/26/2020   CREATININE 0.85 08/26/2020   BILITOT 3.6 (H) 08/26/2020   ALKPHOS 104 08/26/2020   AST 64 (H) 08/26/2020   ALT 51 (H) 08/26/2020   PROT 8.2 (H) 08/26/2020   ALBUMIN 4.0 08/26/2020  CALCIUM 9.1 08/26/2020   ANIONGAP 7 08/26/2020   EGFR 109 04/10/2020   No results found for: CHOL No results found for: HDL No results found for: LDLCALC No results found for: TRIG No results found for: CHOLHDL Lab Results  Component Value Date   HGBA1C 5.6 08/25/2020      Assessment & Plan:   Problem List Items Addressed This Visit       Other   Sickle cell pain crisis ([place]) - Primary   Relevant Orders   POCT URINALYSIS DIP (CLINITEK)   1. Sickle cell pain crisis ([place])  - POCT URINALYSIS DIP (CLINITEK)  2. Type 2 diabetes mellitus without complication, without long-term current use of insulin (HCC) Patient's most recent fructosamine was elevated at 428  3. Essential hypertension - Continue medication, monitor blood pressure at home. Continue DASH diet.  Reminder to go to the ER if any CP, SOB, nausea, dizziness, severe HA, changes vision/speech, left arm numbness and tingling and jaw pain.    4. Influenza vaccine needed  - Flu Vaccine QUAD 36+ mos IM (Fluarix, Fluzone & Afluria Quad PF  Follow-up: Return in about 3 months (around 01/17/2021) for diabetes, sickle cell anemia.   Donia Pounds  APRN, MSN, FNP-C [facility] [address] [place], [place] [address] [phone]

## 2020-10-18 NOTE — Patient Instructions (Signed)
Sickle Cell Anemia, Adult Sickle cell anemia is a condition where your red blood cells are shaped like sickles. Red blood cells carry oxygen through the body. Sickle-shaped cells do not live as long as normal red blood cells. They also clump together and block blood from flowing through the blood vessels. This prevents the body from getting enough oxygen. Sickle cell anemia causes organ damage and pain. It also increases the risk of infection. Follow these instructions at home: Medicines Take over-the-counter and prescription medicines only as told by your doctor. If you were prescribed an antibiotic medicine, take it as told by your doctor. Do not stop taking the antibiotic even if you start to feel better. If you develop a fever, do not take medicines to lower the fever right away. Tell your doctor about the fever. Managing pain, stiffness, and swelling Try these methods to help with pain: Use a heating pad. Take a warm bath. Distract yourself, such as by watching TV. Eating and drinking Drink enough fluid to keep your pee (urine) clear or pale yellow. Drink more in hot weather and during exercise. Limit or avoid alcohol. Eat a healthy diet. Eat plenty of fruits, vegetables, whole grains, and lean protein. Take vitamins and supplements as told by your doctor. Traveling When traveling, keep these with you: Your medical information. The names of your doctors. Your medicines. If you need to take an airplane, talk to your doctor first. Activity Rest often. Avoid exercises that make your heart beat much faster, such as jogging. General instructions Do not use products that have nicotine or tobacco, such as cigarettes and e-cigarettes. If you need help quitting, ask your doctor. Consider wearing a medical alert bracelet. Avoid being in high places (high altitudes), such as mountains. Avoid very hot or cold temperatures. Avoid places where the temperature changes a lot. Keep all follow-up  visits as told by your doctor. This is important. Contact a doctor if: A joint hurts. Your feet or hands hurt or swell. You feel tired (fatigued). Get help right away if: You have symptoms of infection. These include: Fever. Chills. Being very tired. Irritability. Poor eating. Throwing up (vomiting). You feel dizzy or faint. You have new stomach pain, especially on the left side. You have a an erection (priapism) that lasts more than 4 hours. You have numbness in your arms or legs. You have a hard time moving your arms or legs. You have trouble talking. You have pain that does not go away when you take medicine. You are short of breath. You are breathing fast. You have a long-term cough. You have pain in your chest. You have a bad headache. You have a stiff neck. Your stomach looks bloated even though you did not eat much. Your skin is pale. You suddenly cannot see well. Summary Sickle cell anemia is a condition where your red blood cells are shaped like sickles. Follow your doctor's advice on ways to manage pain, food to eat, activities to do, and steps to take for safe travel. Get medical help right away if you have any signs of infection, such as a fever. This information is not intended to replace advice given to you by your health care provider. Make sure you discuss any questions you have with your health care provider. Document Revised: 06/09/2019 Document Reviewed: 06/09/2019 Elsevier Patient Education  [place].

## 2020-11-22 ENCOUNTER — Other Ambulatory Visit: Payer: Self-pay | Admitting: Family Medicine

## 2020-11-22 ENCOUNTER — Telehealth: Payer: Self-pay

## 2020-11-22 DIAGNOSIS — E119 Type 2 diabetes mellitus without complications: Secondary | ICD-10-CM

## 2020-11-22 DIAGNOSIS — I10 Essential (primary) hypertension: Secondary | ICD-10-CM

## 2020-11-22 DIAGNOSIS — D5700 Hb-SS disease with crisis, unspecified: Secondary | ICD-10-CM

## 2020-11-22 NOTE — Telephone Encounter (Signed)
Oxycodone  Vitamin D

## 2020-11-23 ENCOUNTER — Other Ambulatory Visit: Payer: Self-pay | Admitting: Family Medicine

## 2020-11-23 DIAGNOSIS — D5700 Hb-SS disease with crisis, unspecified: Secondary | ICD-10-CM

## 2020-11-23 MED ORDER — OXYCODONE HCL 15 MG PO TABS: 15.0000 mg | ORAL_TABLET | ORAL | 0 refills | Status: DC | PRN

## 2020-11-23 NOTE — Progress Notes (Signed)
Reviewed PDMP substance reporting system prior to prescribing opiate medications. No inconsistencies noted.  Meds ordered this encounter  Medications   oxyCODONE (ROXICODONE) 15 MG immediate release tablet    Sig: Take 1 tablet (15 mg total) by mouth every 4 (four) hours as needed for up to 15 days for pain.    Dispense:  60 tablet    Refill:  0    Order Specific Question:   Supervising Provider    Answer:   JEGEDE, OLUGBEMIGA E [[id number]]   Starleen Trussell Moore Cartha Rotert  APRN, MSN, FNP-C [facility] [address]  [place],  [address] [phone]  

## 2021-01-22 ENCOUNTER — Ambulatory Visit (INDEPENDENT_AMBULATORY_CARE_PROVIDER_SITE_OTHER): Payer: Self-pay | Admitting: Family Medicine

## 2021-01-22 ENCOUNTER — Encounter: Payer: Self-pay | Admitting: Family Medicine

## 2021-01-22 ENCOUNTER — Other Ambulatory Visit: Payer: Self-pay

## 2021-01-22 VITALS — BP 132/87 | HR 88 | Temp 97.9°F | Ht 66.0 in | Wt 169.0 lb

## 2021-01-22 DIAGNOSIS — I10 Essential (primary) hypertension: Secondary | ICD-10-CM

## 2021-01-22 DIAGNOSIS — E119 Type 2 diabetes mellitus without complications: Secondary | ICD-10-CM

## 2021-01-22 DIAGNOSIS — E559 Vitamin D deficiency, unspecified: Secondary | ICD-10-CM

## 2021-01-22 DIAGNOSIS — D5700 Hb-SS disease with crisis, unspecified: Secondary | ICD-10-CM

## 2021-01-22 LAB — POCT URINALYSIS DIP (CLINITEK)
Bilirubin, UA: NEGATIVE
Blood, UA: NEGATIVE
Glucose, UA: NEGATIVE mg/dL
Ketones, POC UA: NEGATIVE mg/dL
Leukocytes, UA: NEGATIVE
Nitrite, UA: NEGATIVE
POC PROTEIN,UA: NEGATIVE
Spec Grav, UA: 1.02 (ref 1.010–1.025)
Urobilinogen, UA: 0.2 E.U./dL
pH, UA: 5.5 (ref 5.0–8.0)

## 2021-01-22 LAB — GLUCOSE, POCT (MANUAL RESULT ENTRY): POC Glucose: 175 mg/dl — AB (ref 70–99)

## 2021-01-22 NOTE — Patient Instructions (Signed)
Sickle Cell Anemia, Adult Sickle cell anemia is a condition where your red blood cells are shaped like sickles. Red blood cells carry oxygen through the body. Sickle-shaped cells do not live as long as normal red blood cells. They also clump together and block blood from flowing through the blood vessels. This prevents the body from getting enough oxygen. Sickle cell anemia causes organ damage and pain. It also increases the risk of infection. Follow these instructions at home: Medicines Take over-the-counter and prescription medicines only as told by your doctor. If you were prescribed an antibiotic medicine, take it as told by your doctor. Do not stop taking the antibiotic even if you start to feel better. If you develop a fever, do not take medicines to lower the fever right away. Tell your doctor about the fever. Managing pain, stiffness, and swelling Try these methods to help with pain: Use a heating pad. Take a warm bath. Distract yourself, such as by watching TV. Eating and drinking Drink enough fluid to keep your pee (urine) clear or pale yellow. Drink more in hot weather and during exercise. Limit or avoid alcohol. Eat a healthy diet. Eat plenty of fruits, vegetables, whole grains, and lean protein. Take vitamins and supplements as told by your doctor. Traveling When traveling, keep these with you: Your medical information. The names of your doctors. Your medicines. If you need to take an airplane, talk to your doctor first. Activity Rest often. Avoid exercises that make your heart beat much faster, such as jogging. General instructions Do not use products that have nicotine or tobacco, such as cigarettes and e-cigarettes. If you need help quitting, ask your doctor. Consider wearing a medical alert bracelet. Avoid being in high places (high altitudes), such as mountains. Avoid very hot or cold temperatures. Avoid places where the temperature changes a lot. Keep all follow-up  visits as told by your doctor. This is important. Contact a doctor if: A joint hurts. Your feet or hands hurt or swell. You feel tired (fatigued). Get help right away if: You have symptoms of infection. These include: Fever. Chills. Being very tired. Irritability. Poor eating. Throwing up (vomiting). You feel dizzy or faint. You have new stomach pain, especially on the left side. You have a an erection (priapism) that lasts more than 4 hours. You have numbness in your arms or legs. You have a hard time moving your arms or legs. You have trouble talking. You have pain that does not go away when you take medicine. You are short of breath. You are breathing fast. You have a long-term cough. You have pain in your chest. You have a bad headache. You have a stiff neck. Your stomach looks bloated even though you did not eat much. Your skin is pale. You suddenly cannot see well. Summary Sickle cell anemia is a condition where your red blood cells are shaped like sickles. Follow your doctor's advice on ways to manage pain, food to eat, activities to do, and steps to take for safe travel. Get medical help right away if you have any signs of infection, such as a fever. This information is not intended to replace advice given to you by your health care provider. Make sure you discuss any questions you have with your health care provider. Document Revised: 06/09/2019 Document Reviewed: 06/09/2019 Elsevier Patient Education  [employer] [place].

## 2021-01-22 NOTE — Progress Notes (Signed)
[facility]   Established Patient Office Visit  Subjective:  Patient ID: Gregory Bryant, male    DOB: [birth year]  Age: 45 y.o. MRN: [id number]  CC:  Chief Complaint  Patient presents with   Follow-up    Pt is here for 3 months follow up. Pt needs a refill on metformin and oxycodone  No issues or concerns    HPI Gregory Bryant is a 45 year old male with a medical history significant for sickle cell disease, chronic pain syndrome, opiate dependence and tolerance, type 2 diabetes mellitus, hypertension, and history of anemia of chronic disease presents for follow-up of chronic conditions.  Patient states that he has been doing fairly well.  He states that chronic pain increased with change in weather last week.  He states that he typically has increased body aches during cold weather.  He rates his pain as 5/10. Patient has a history of type 2 diabetes mellitus.  He does not check blood glucose consistently at home.  This morning, blood glucose is 175.  Patient states that he had orange juice prior to arriving.  Most recent fructosamine was consistent with patient's baseline.  He denies any polyuria, polydipsia, or polyphagia.  No dizziness or headache.  Patient is up-to-date with eye exams.  He is scheduled to have LASIK surgery on February 20, 2021.  Patient is up-to-date with all vaccinations.  Past Medical History:  Diagnosis Date   Diabetes mellitus without complication (HCC)    Sickle cell anemia (HCC)     Past Surgical History:  Procedure Laterality Date   CHOLECYSTECTOMY      Family History  Problem Relation Age of Onset   Diabetes Mother     Social History   Socioeconomic History   Marital status: Married    Spouse name: Not on file   Number of children: Not on file   Years of education: Not on file   Highest education level: Not on file  Occupational History   Not on file  Tobacco Use   Smoking status: Never    Smokeless tobacco: Never  Vaping Use   Vaping Use: Never used  Substance and Sexual Activity   Alcohol use: No   Drug use: No   Sexual activity: Not Currently  Other Topics Concern   Not on file  Social History Narrative   Not on file   Social Determinants of Health   Financial Resource Strain: Not on file  Food Insecurity: Not on file  Transportation Needs: Not on file  Physical Activity: Not on file  Stress: Not on file  Social Connections: Not on file  Intimate Partner Violence: Not on file    Outpatient Medications Prior to Visit  Medication Sig Dispense Refill   folic acid (FOLVITE) 1 MG tablet Take 1 tablet (1 mg total) by mouth daily. 30 tablet 5   glipiZIDE (GLUCOTROL) 10 MG tablet Take 1 tablet (10 mg total) by mouth daily before breakfast. 30 tablet 5   ibuprofen (ADVIL) 600 MG tablet Take 1 tablet (600 mg total) by mouth every 8 (eight) hours as needed. 30 tablet 5   lisinopril (ZESTRIL) 2.5 MG tablet Take 1 tablet (2.5 mg total) by mouth daily. 30 tablet 5   ondansetron (ZOFRAN ODT) 8 MG disintegrating tablet Take 1 tablet (8 mg total) by mouth every 8 (eight) hours as needed for nausea or vomiting. 20 tablet 0   Vitamin D, Ergocalciferol, (DRISDOL) 1.25 MG (50000 UNIT) CAPS capsule  Take 1 capsule (50,000 Units total) by mouth every 7 (seven) days. (Patient taking differently: Take 50,000 Units by mouth every 7 (seven) days. Mondays) 30 capsule 1   metFORMIN (GLUCOPHAGE) 1000 MG tablet Take 1 tablet (1,000 mg total) by mouth 2 (two) times daily with a meal. 180 tablet 2   oxyCODONE (ROXICODONE) 15 MG immediate release tablet Take 1 tablet (15 mg total) by mouth every 4 (four) hours as needed for up to 15 days for pain. 60 tablet 0   No facility-administered medications prior to visit.    Allergies  Allergen Reactions   Aspirin Other (See Comments)    unknown   Sulfa Antibiotics Other (See Comments)    unknown    ROS Review of Systems  Constitutional:  Negative.   HENT: Negative.    Eyes: Negative.   Respiratory: Negative.    Cardiovascular: Negative.   Gastrointestinal: Negative.   Endocrine: Negative for polydipsia, polyphagia and polyuria.  Genitourinary: Negative.   Musculoskeletal:  Positive for arthralgias.  Neurological: Negative.   Psychiatric/Behavioral: Negative.       Objective:    Physical Exam Constitutional:      Appearance: Normal appearance.  Eyes:     Pupils: Pupils are equal, round, and reactive to light.  Cardiovascular:     Rate and Rhythm: Normal rate and regular rhythm.     Pulses: Normal pulses.  Pulmonary:     Effort: Pulmonary effort is normal.  Abdominal:     General: Bowel sounds are normal.  Musculoskeletal:        General: Normal range of motion.  Skin:    General: Skin is warm.  Neurological:     General: No focal deficit present.     Mental Status: He is alert. Mental status is at baseline.  Psychiatric:        Mood and Affect: Mood normal.        Behavior: Behavior normal.        Thought Content: Thought content normal.        Judgment: Judgment normal.    BP 132/87 (BP Location: Left Arm, Patient Position: Sitting)    Pulse 88    Temp 97.9 F (36.6 C)    Ht _0  (1.676 m)    Wt 169 lb (76.7 kg)    SpO2 98%    BMI 27.28 kg/m  Wt Readings from Last 3 Encounters:  01/22/21 169 lb (76.7 kg)  10/18/20 167 lb 6.4 oz (75.9 kg)  08/25/20 162 lb 7.7 oz (73.7 kg)     Health Maintenance Due  Topic Date Due   COVID-19 Vaccine (1) Never done   Meningococcal B Vaccine (1 of 4 - Increased Risk Bexsero 2-dose series) 05/28/1985    There are no preventive care reminders to display for this patient.  No results found for: TSH Lab Results  Component Value Date   WBC 12.7 (H) 08/26/2020   HGB 12.6 (L) 08/26/2020   HCT 35.0 (L) 08/26/2020   MCV 80.6 08/26/2020   PLT 333 08/26/2020   Lab Results  Component Value Date   NA 133 (L) 08/26/2020   K 4.2 08/26/2020   CO2 26 08/26/2020    GLUCOSE 151 (H) 08/26/2020   BUN 11 08/26/2020   CREATININE 0.85 08/26/2020   BILITOT 3.6 (H) 08/26/2020   ALKPHOS 104 08/26/2020   AST 64 (H) 08/26/2020   ALT 51 (H) 08/26/2020   PROT 8.2 (H) 08/26/2020   ALBUMIN 4.0 08/26/2020   CALCIUM  9.1 08/26/2020   ANIONGAP 7 08/26/2020   EGFR 109 04/10/2020   No results found for: CHOL No results found for: HDL No results found for: LDLCALC No results found for: TRIG No results found for: CHOLHDL Lab Results  Component Value Date   HGBA1C 5.6 08/25/2020      Assessment & Plan:   Problem List Items Addressed This Visit       Endocrine   Diabetes ([place]) - Primary   Relevant Orders   Glucose (CBG) (Completed)   Fructosamine   POCT URINALYSIS DIP (CLINITEK) (Completed)     Other   Sickle cell anemia with pain (HCC)   Relevant Orders   Sickle Cell Panel   POCT URINALYSIS DIP (CLINITEK) (Completed)   Other Visit Diagnoses     Vitamin D deficiency       Relevant Orders   Sickle Cell Panel   Essential hypertension         1. Type 2 diabetes mellitus without complication, without long-term current use of insulin (HCC)  - Glucose (CBG) - Fructosamine - POCT URINALYSIS DIP (CLINITEK)  2. Vitamin D deficiency  - Sickle Cell Panel  3. Essential hypertension BP 132/87 (BP Location: Left Arm, Patient Position: Sitting)    Pulse 88    Temp 97.9 F (36.6 C)    Ht _0  (1.676 m)    Wt 169 lb (76.7 kg)    SpO2 98%    BMI 27.28 kg/m  - Continue medication, monitor blood pressure at home. Continue DASH diet.  Reminder to go to the ER if any CP, SOB, nausea, dizziness, severe HA, changes vision/speech, left arm numbness and tingling and jaw pain.    4. Sickle cell anemia with pain (HCC)  - Sickle Cell Panel - POCT URINALYSIS DIP (CLINITEK)   Follow-up: Return in about 3 months (around 04/22/2021).    Donia Pounds  APRN, MSN, FNP-C [facility] [address] [place],  [place] [address] [phone]

## 2021-01-23 LAB — CMP14+CBC/D/PLT+FER+RETIC+V...
ALT: 44 IU/L (ref 0–44)
AST: 37 IU/L (ref 0–40)
Albumin/Globulin Ratio: 1.4 (ref 1.2–2.2)
Albumin: 4.6 g/dL (ref 4.0–5.0)
Alkaline Phosphatase: 119 IU/L (ref 44–121)
BUN/Creatinine Ratio: 13 (ref 9–20)
BUN: 9 mg/dL (ref 6–24)
Basophils Absolute: 0.2 10*3/uL (ref 0.0–0.2)
Basos: 1 %
Bilirubin Total: 2.9 mg/dL — ABNORMAL HIGH (ref 0.0–1.2)
CO2: 20 mmol/L (ref 20–29)
Calcium: 9.6 mg/dL (ref 8.7–10.2)
Chloride: 99 mmol/L (ref 96–106)
Creatinine, Ser: 0.72 mg/dL — ABNORMAL LOW (ref 0.76–1.27)
EOS (ABSOLUTE): 1.6 10*3/uL — ABNORMAL HIGH (ref 0.0–0.4)
Eos: 10 %
Ferritin: 854 ng/mL — ABNORMAL HIGH (ref 30–400)
Globulin, Total: 3.3 g/dL (ref 1.5–4.5)
Glucose: 171 mg/dL — ABNORMAL HIGH (ref 70–99)
Hematocrit: 36.9 % — ABNORMAL LOW (ref 37.5–51.0)
Hemoglobin: 12.8 g/dL — ABNORMAL LOW (ref 13.0–17.7)
Immature Grans (Abs): 0.3 10*3/uL — ABNORMAL HIGH (ref 0.0–0.1)
Immature Granulocytes: 2 %
Lymphocytes Absolute: 4 10*3/uL — ABNORMAL HIGH (ref 0.7–3.1)
Lymphs: 25 %
MCH: 29.3 pg (ref 26.6–33.0)
MCHC: 34.7 g/dL (ref 31.5–35.7)
MCV: 84 fL (ref 79–97)
Monocytes Absolute: 1.1 10*3/uL — ABNORMAL HIGH (ref 0.1–0.9)
Monocytes: 7 %
NRBC: 2 % — ABNORMAL HIGH (ref 0–0)
Neutrophils Absolute: 9.2 10*3/uL — ABNORMAL HIGH (ref 1.4–7.0)
Neutrophils: 55 %
Platelets: 435 10*3/uL (ref 150–450)
Potassium: 4.6 mmol/L (ref 3.5–5.2)
RBC: 4.37 x10E6/uL (ref 4.14–5.80)
RDW: 15.1 % (ref 11.6–15.4)
Retic Ct Pct: 7.4 % — ABNORMAL HIGH (ref 0.6–2.6)
Sodium: 135 mmol/L (ref 134–144)
Total Protein: 7.9 g/dL (ref 6.0–8.5)
Vit D, 25-Hydroxy: 54.6 ng/mL (ref 30.0–100.0)
WBC: 16.4 10*3/uL — ABNORMAL HIGH (ref 3.4–10.8)
eGFR: 115 mL/min/{1.73_m2} (ref >59)

## 2021-01-23 LAB — FRUCTOSAMINE: Fructosamine: 464 umol/L — ABNORMAL HIGH (ref 0–285)

## 2021-01-29 ENCOUNTER — Other Ambulatory Visit: Payer: Self-pay | Admitting: Family Medicine

## 2021-01-29 DIAGNOSIS — D5700 Hb-SS disease with crisis, unspecified: Secondary | ICD-10-CM

## 2021-01-31 ENCOUNTER — Other Ambulatory Visit: Payer: Self-pay | Admitting: Family Medicine

## 2021-01-31 DIAGNOSIS — D5700 Hb-SS disease with crisis, unspecified: Secondary | ICD-10-CM

## 2021-02-20 ENCOUNTER — Telehealth: Payer: Self-pay

## 2021-02-20 NOTE — Telephone Encounter (Signed)
Oxycodone  Metformin

## 2021-02-21 ENCOUNTER — Other Ambulatory Visit: Payer: Self-pay | Admitting: Family Medicine

## 2021-02-21 DIAGNOSIS — E119 Type 2 diabetes mellitus without complications: Secondary | ICD-10-CM

## 2021-02-21 DIAGNOSIS — D5700 Hb-SS disease with crisis, unspecified: Secondary | ICD-10-CM

## 2021-02-21 MED ORDER — METFORMIN HCL 1000 MG PO TABS: 1000.0000 mg | ORAL_TABLET | Freq: Two times a day (BID) | ORAL | 2 refills | Status: DC

## 2021-02-21 MED ORDER — OXYCODONE HCL 15 MG PO TABS: 15.0000 mg | ORAL_TABLET | ORAL | 0 refills | Status: DC | PRN

## 2021-02-21 NOTE — Progress Notes (Signed)
Reviewed PDMP substance reporting system prior to prescribing opiate medications. No inconsistencies noted.  Meds ordered this encounter  Medications   oxyCODONE (ROXICODONE) 15 MG immediate release tablet    Sig: Take 1 tablet (15 mg total) by mouth every 4 (four) hours as needed for up to 15 days for pain.    Dispense:  60 tablet    Refill:  0    Order Specific Question:   Supervising Provider    Answer:   Tresa Garter [[id number]]   metFORMIN (GLUCOPHAGE) 1000 MG tablet    Sig: Take 1 tablet (1,000 mg total) by mouth 2 (two) times daily with a meal.    Dispense:  180 tablet    Refill:  2    Order Specific Question:   Supervising Provider    Answer:   Tresa Garter [[id number]]     Donia Pounds  APRN, MSN, FNP-C [facility] [address], [place] [address] [phone]

## 2021-04-23 ENCOUNTER — Ambulatory Visit: Payer: Medicaid Other | Admitting: Family Medicine

## 2021-04-24 ENCOUNTER — Other Ambulatory Visit: Payer: Self-pay | Admitting: Family Medicine

## 2021-04-24 DIAGNOSIS — D5700 Hb-SS disease with crisis, unspecified: Secondary | ICD-10-CM

## 2021-04-24 DIAGNOSIS — E119 Type 2 diabetes mellitus without complications: Secondary | ICD-10-CM

## 2021-04-24 DIAGNOSIS — I10 Essential (primary) hypertension: Secondary | ICD-10-CM

## 2021-04-25 ENCOUNTER — Encounter: Payer: Self-pay | Admitting: Family Medicine

## 2021-04-25 ENCOUNTER — Ambulatory Visit (INDEPENDENT_AMBULATORY_CARE_PROVIDER_SITE_OTHER): Payer: Self-pay | Admitting: Family Medicine

## 2021-04-25 VITALS — BP 138/87 | HR 75 | Temp 98.3°F | Ht 66.0 in | Wt 168.2 lb

## 2021-04-25 DIAGNOSIS — I10 Essential (primary) hypertension: Secondary | ICD-10-CM

## 2021-04-25 DIAGNOSIS — E559 Vitamin D deficiency, unspecified: Secondary | ICD-10-CM

## 2021-04-25 DIAGNOSIS — E119 Type 2 diabetes mellitus without complications: Secondary | ICD-10-CM

## 2021-04-25 DIAGNOSIS — D5700 Hb-SS disease with crisis, unspecified: Secondary | ICD-10-CM

## 2021-04-25 LAB — POCT URINALYSIS DIPSTICK
Appearance: NEGATIVE
Bilirubin, UA: NEGATIVE
Blood, UA: NEGATIVE
Glucose, UA: NEGATIVE
Ketones, UA: NEGATIVE
Leukocytes, UA: NEGATIVE
Nitrite, UA: NEGATIVE
Protein, UA: NEGATIVE
Spec Grav, UA: 1.015 (ref 1.010–1.025)
Urobilinogen, UA: 0.2 E.U./dL
pH, UA: 5 (ref 5.0–8.0)

## 2021-04-25 NOTE — Progress Notes (Signed)
?[facility]  ? ?Established Patient Office Visit ? ?Subjective:  ?Patient ID: Gregory Bryant, male    DOB: 06/12/1975  Age: 46 y.o. MRN: [id number] ? ?CC:  ?Chief Complaint  ?Patient presents with  ? Diabetes  ?  Sickle cell disease  ? ? ?HPI ?Gregory Bryant is a very pleasant 46 year old male with a medical history significant for sickle cell disease, chronic pain syndrome, opiate dependence and type 2 diabetes mellitus hypertension, and mild intermittent asthma presents for follow-up of chronic conditions.  Patient states that he is doing well and is without complaint on today.  He states that he has been taking all medications consistently.  He generally follows a balanced diet, but does not exercise due to chronic patient denies any headache, dizziness urinary symptoms he denies any lower extremity swellings.  Patient is up-to-date with ophthalmologist, he states that he has a follow-up appointment in 1 month ? ?Past Medical History:  ?Diagnosis Date  ? Diabetes mellitus without complication ([place])   ? Sickle cell anemia (HCC)   ? ? ?Past Surgical History:  ?Procedure Laterality Date  ? CHOLECYSTECTOMY    ? ? ?Family History  ?Problem Relation Age of Onset  ? Diabetes Mother   ? ? ?Social History  ? ?Socioeconomic History  ? Marital status: Married  ?  Spouse name: Not on file  ? Number of children: Not on file  ? Years of education: Not on file  ? Highest education level: Not on file  ?Occupational History  ? Not on file  ?Tobacco Use  ? Smoking status: Never  ? Smokeless tobacco: Never  ?Vaping Use  ? Vaping Use: Never used  ?Substance and Sexual Activity  ? Alcohol use: No  ? Drug use: No  ? Sexual activity: Not Currently  ?Other Topics Concern  ? Not on file  ?Social History Narrative  ? Not on file  ? ?Social Determinants of Health  ? ?Financial Resource Strain: Not on file  ?Food Insecurity: Not on file  ?Transportation Needs: Not on file  ?Physical  Activity: Not on file  ?Stress: Not on file  ?Social Connections: Not on file  ?Intimate Partner Violence: Not on file  ? ? ?Outpatient Medications Prior to Visit  ?Medication Sig Dispense Refill  ? folic acid (FOLVITE) 1 MG tablet Take 1 tablet (1 mg total) by mouth daily. 30 tablet 5  ? glipiZIDE (GLUCOTROL) 10 MG tablet Take 1 tablet (10 mg total) by mouth daily before breakfast. 30 tablet 5  ? ibuprofen (ADVIL) 600 MG tablet Take 1 tablet (600 mg total) by mouth every 8 (eight) hours as needed. 30 tablet 5  ? lisinopril (ZESTRIL) 2.5 MG tablet Take 1 tablet (2.5 mg total) by mouth daily. 30 tablet 5  ? metFORMIN (GLUCOPHAGE) 1000 MG tablet Take 1 tablet (1,000 mg total) by mouth 2 (two) times daily with a meal. 180 tablet 2  ? ondansetron (ZOFRAN ODT) 8 MG disintegrating tablet Take 1 tablet (8 mg total) by mouth every 8 (eight) hours as needed for nausea or vomiting. 20 tablet 0  ? oxyCODONE (ROXICODONE) 15 MG immediate release tablet Take 1 tablet (15 mg total) by mouth every 4 (four) hours as needed for up to 15 days for pain. 60 tablet 0  ? Vitamin D, Ergocalciferol, (DRISDOL) 1.25 MG (50000 UNIT) CAPS capsule Take 1 capsule (50,000 Units total) by mouth every 7 (seven) days. Mondays 12 capsule 3  ? ?No facility-administered medications  prior to visit.  ? ? ?Allergies  ?Allergen Reactions  ? Aspirin Other (See Comments)  ?  unknown  ? Sulfa Antibiotics Other (See Comments)  ?  unknown  ? ? ?ROS ?Review of Systems  ?Constitutional: Negative.   ?HENT: Negative.    ?Respiratory: Negative.    ?Gastrointestinal: Negative.   ?Genitourinary: Negative.   ?Musculoskeletal:  Positive for arthralgias and back pain.  ?Skin: Negative.   ?Neurological: Negative.   ?Psychiatric/Behavioral: Negative.    ? ?  ?Objective:  ?  ?Physical Exam ?HENT:  ?   Mouth/Throat:  ?   Mouth: Mucous membranes are moist.  ?Eyes:  ?   Pupils: Pupils are equal, round, and reactive to light.  ?Cardiovascular:  ?   Rate and Rhythm: Normal rate  and regular rhythm.  ?Abdominal:  ?   General: Bowel sounds are normal.  ?Musculoskeletal:     ?   General: Normal range of motion.  ?Skin: ?   General: Skin is warm.  ?Neurological:  ?   General: No focal deficit present.  ?   Mental Status: He is alert. Mental status is at baseline.  ?Psychiatric:     ?   Mood and Affect: Mood normal.     ?   Thought Content: Thought content normal.  ? ? ?There were no vitals taken for this visit. ?Wt Readings from Last 3 Encounters:  ?01/22/21 169 lb (76.7 kg)  ?10/18/20 167 lb 6.4 oz (75.9 kg)  ?08/25/20 162 lb 7.7 oz (73.7 kg)  ? ? ? ?Health Maintenance Due  ?Topic Date Due  ? COVID-19 Vaccine (1) Never done  ? OPHTHALMOLOGY EXAM  03/27/2021  ? ? ?There are no preventive care reminders to display for this patient. ? ?No results found for: TSH ?Lab Results  ?Component Value Date  ? WBC 16.4 (H) 01/22/2021  ? HGB 12.8 (L) 01/22/2021  ? HCT 36.9 (L) 01/22/2021  ? MCV 84 01/22/2021  ? PLT 435 01/22/2021  ? ?Lab Results  ?Component Value Date  ? NA 135 01/22/2021  ? K 4.6 01/22/2021  ? CO2 20 01/22/2021  ? GLUCOSE 171 (H) 01/22/2021  ? BUN 9 01/22/2021  ? CREATININE 0.72 (L) 01/22/2021  ? BILITOT 2.9 (H) 01/22/2021  ? ALKPHOS 119 01/22/2021  ? AST 37 01/22/2021  ? ALT 44 01/22/2021  ? PROT 7.9 01/22/2021  ? ALBUMIN 4.6 01/22/2021  ? CALCIUM 9.6 01/22/2021  ? ANIONGAP 7 08/26/2020  ? EGFR 115 01/22/2021  ? ?No results found for: CHOL ?No results found for: HDL ?No results found for: [place] ?No results found for: TRIG ?No results found for: CHOLHDL ?Lab Results  ?Component Value Date  ? HGBA1C 5.6 08/25/2020  ? ? ?  ?Assessment & Plan:  ? ?Problem List Items Addressed This Visit   ? ?  ? Endocrine  ? Diabetes ([place])  ? Relevant Orders  ? Urinalysis Dipstick (Completed)  ? Fructosamine  ?  ? Other  ? Sickle cell anemia with pain (HCC) - Primary  ? Relevant Orders  ? Urinalysis Dipstick (Completed)  ? Sickle Cell Panel  ? ?Other Visit Diagnoses   ? ? Essential hypertension      ?  Vitamin D deficiency      ? Relevant Orders  ? Sickle Cell Panel  ? ?  ? ?1. Sickle cell anemia with pain (HCC) ?Patient will continue folic acid and hydroxyurea.  He is under the care of hematologist.  He typically follows up every 3 to  6 months. ?- Urinalysis Dipstick ?- Sickle Cell Panel ? ?2. Essential hypertension ?BP 138/87 (BP Location: Right Arm, Patient Position: Sitting, Cuff Size: Normal)   Pulse 75   Temp 98.3 ?F (36.8 ?C)   Ht 5' 6"  (1.676 m)   Wt 168 lb 4 oz (76.3 kg)   SpO2 100%   BMI 27.16 kg/m?  ?- Continue medication, monitor blood pressure at home. Continue DASH diet.  Reminder to go to the ER if any CP, SOB, nausea, dizziness, severe HA, changes vision/speech, left arm numbness and tingling and jaw pain. ? ? ? ?3. Type 2 diabetes mellitus without complication, without long-term current use of insulin (HCC) ?- Urinalysis Dipstick ?- Fructosamine ? ?4. Vitamin D deficiency ? ?- Sickle Cell Panel ? ?Follow-up: Return in about 3 months (around 07/26/2021) for sickle cell anemia, diabetes, hypertension.  ? ? ?Donia Pounds  APRN, MSN, FNP-C ?[facility] ?[facility] ?[address]  ?[place], [place] [address] ?[phone] ? ?

## 2021-04-26 LAB — CMP14+CBC/D/PLT+FER+RETIC+V...
ALT: 28 IU/L (ref 0–44)
AST: 31 IU/L (ref 0–40)
Albumin/Globulin Ratio: 1.6 (ref 1.2–2.2)
Albumin: 4.7 g/dL (ref 4.0–5.0)
Alkaline Phosphatase: 112 IU/L (ref 44–121)
BUN/Creatinine Ratio: 13 (ref 9–20)
BUN: 11 mg/dL (ref 6–24)
Basophils Absolute: 0.2 10*3/uL (ref 0.0–0.2)
Basos: 1 %
Bilirubin Total: 3.4 mg/dL — ABNORMAL HIGH (ref 0.0–1.2)
CO2: 20 mmol/L (ref 20–29)
Calcium: 9.9 mg/dL (ref 8.7–10.2)
Chloride: 97 mmol/L (ref 96–106)
Creatinine, Ser: 0.86 mg/dL (ref 0.76–1.27)
EOS (ABSOLUTE): 0.6 10*3/uL — ABNORMAL HIGH (ref 0.0–0.4)
Eos: 4 %
Ferritin: 739 ng/mL — ABNORMAL HIGH (ref 30–400)
Globulin, Total: 3 g/dL (ref 1.5–4.5)
Glucose: 147 mg/dL — ABNORMAL HIGH (ref 70–99)
Hematocrit: 40.5 % (ref 37.5–51.0)
Hemoglobin: 13.4 g/dL (ref 13.0–17.7)
Immature Grans (Abs): 0.2 10*3/uL — ABNORMAL HIGH (ref 0.0–0.1)
Immature Granulocytes: 1 %
Lymphocytes Absolute: 4.9 10*3/uL — ABNORMAL HIGH (ref 0.7–3.1)
Lymphs: 31 %
MCH: 28.7 pg (ref 26.6–33.0)
MCHC: 33.1 g/dL (ref 31.5–35.7)
MCV: 87 fL (ref 79–97)
Monocytes Absolute: 1.1 10*3/uL — ABNORMAL HIGH (ref 0.1–0.9)
Monocytes: 7 %
NRBC: 2 % — ABNORMAL HIGH (ref 0–0)
Neutrophils Absolute: 9 10*3/uL — ABNORMAL HIGH (ref 1.4–7.0)
Neutrophils: 56 %
Platelets: 426 10*3/uL (ref 150–450)
Potassium: 4.6 mmol/L (ref 3.5–5.2)
RBC: 4.67 x10E6/uL (ref 4.14–5.80)
RDW: 14.3 % (ref 11.6–15.4)
Retic Ct Pct: 7.5 % — ABNORMAL HIGH (ref 0.6–2.6)
Sodium: 136 mmol/L (ref 134–144)
Total Protein: 7.7 g/dL (ref 6.0–8.5)
Vit D, 25-Hydroxy: 62.1 ng/mL (ref 30.0–100.0)
WBC: 15.9 10*3/uL — ABNORMAL HIGH (ref 3.4–10.8)
eGFR: 109 mL/min/{1.73_m2} (ref >59)

## 2021-04-26 LAB — FRUCTOSAMINE: Fructosamine: 448 umol/L — ABNORMAL HIGH (ref 0–285)

## 2021-05-24 LAB — HM DIABETES EYE EXAM

## 2021-08-06 ENCOUNTER — Other Ambulatory Visit: Payer: Self-pay | Admitting: Family Medicine

## 2021-08-06 ENCOUNTER — Encounter: Payer: Self-pay | Admitting: Family Medicine

## 2021-08-06 ENCOUNTER — Telehealth: Payer: Self-pay | Admitting: Family Medicine

## 2021-08-06 ENCOUNTER — Ambulatory Visit (INDEPENDENT_AMBULATORY_CARE_PROVIDER_SITE_OTHER): Payer: 59 | Admitting: Family Medicine

## 2021-08-06 ENCOUNTER — Ambulatory Visit: Payer: 59 | Admitting: Family Medicine

## 2021-08-06 VITALS — BP 150/99 | HR 74 | Temp 97.9°F | Ht 66.0 in | Wt 160.0 lb

## 2021-08-06 DIAGNOSIS — E119 Type 2 diabetes mellitus without complications: Secondary | ICD-10-CM | POA: Diagnosis not present

## 2021-08-06 DIAGNOSIS — E559 Vitamin D deficiency, unspecified: Secondary | ICD-10-CM | POA: Diagnosis not present

## 2021-08-06 DIAGNOSIS — M7918 Myalgia, other site: Secondary | ICD-10-CM

## 2021-08-06 DIAGNOSIS — I10 Essential (primary) hypertension: Secondary | ICD-10-CM

## 2021-08-06 DIAGNOSIS — D5700 Hb-SS disease with crisis, unspecified: Secondary | ICD-10-CM

## 2021-08-06 MED ORDER — GLIPIZIDE 10 MG PO TABS: 10.0000 mg | ORAL_TABLET | Freq: Every day | ORAL | 2 refills | Status: DC

## 2021-08-06 MED ORDER — FOLIC ACID 1 MG PO TABS: 1.0000 mg | ORAL_TABLET | Freq: Every day | ORAL | 5 refills | Status: DC

## 2021-08-06 MED ORDER — OXYCODONE HCL 10 MG PO TABS: 10.0000 mg | ORAL_TABLET | ORAL | 0 refills | Status: DC | PRN

## 2021-08-06 MED ORDER — OXYCODONE HCL 15 MG PO TABS: 15.0000 mg | ORAL_TABLET | ORAL | 0 refills | Status: DC | PRN

## 2021-08-06 MED ORDER — IBUPROFEN 600 MG PO TABS: 600.0000 mg | ORAL_TABLET | Freq: Three times a day (TID) | ORAL | 5 refills | Status: DC | PRN

## 2021-08-06 MED ORDER — METFORMIN HCL 1000 MG PO TABS
1000.0000 mg | ORAL_TABLET | Freq: Two times a day (BID) | ORAL | 0 refills | Status: DC
Start: 2021-08-06 — End: 2021-09-02

## 2021-08-06 MED ORDER — LISINOPRIL 5 MG PO TABS: 5.0000 mg | ORAL_TABLET | Freq: Every day | ORAL | 1 refills | Status: DC

## 2021-08-06 NOTE — Progress Notes (Signed)
Reviewed PDMP substance reporting system prior to prescribing opiate medications. No inconsistencies noted.  Meds ordered this encounter  Medications   Oxycodone HCl 10 MG TABS    Sig: Take 1 tablet (10 mg total) by mouth every 4 (four) hours as needed.    Dispense:  60 tablet    Refill:  0    Order Specific Question:   Supervising Provider    Answer:   Tresa Garter [[id number]]   Donia Pounds  APRN, MSN, FNP-C [facility] [address] [place], [place] [address] [phone]

## 2021-08-06 NOTE — Telephone Encounter (Signed)
[facility], commissioning stating that Oxycodone IR '15mg'$  is on backorder at this store.  They have '10mg'$  in stock or the RX could be sent elsewhere  Katie: [phone]

## 2021-08-06 NOTE — Progress Notes (Signed)
Pt presents for hospital f/u after [place] crisis 07/11/21. Pt states he feels fine now and does not have any concerns at this time. BP was elevated today. Pt states he has been taking BP medication.

## 2021-08-06 NOTE — Progress Notes (Signed)
[facility]   Established Patient Office Visit  Subjective   Patient ID: Gregory Bryant, male    DOB: Jun 14, 1975  Age: 46 y.o. MRN: [id number]  Chief Complaint  Patient presents with   Sickle Cell Anemia    Aryn Kops  is a 46 year old male with a medical history significant for sickle cell disease, chronic pain syndrome, opiate dependence and tolerance, type 2 diabetes mellitus, and mild intermittent asthma presents for follow-up of chronic conditions.  Patient says that he has been doing well and is without complaint today.  He is not having any pain at this time.  He last had oxycodone 2 days prior.  He denies any headache, shortness of breath, dizziness, blurred vision, polyp of CF, polyuria or polyphasia.  No nausea, patient generally follows a healthy diet does not exercise due to chronic.  He is up-to-date with labs.  He typically goes to dentist twice yearly.  He has not had a prostate exam.    Patient Active Problem List   Diagnosis Date Noted   COVID 08/25/2020   Uncontrolled type 2 diabetes mellitus with hyperglycemia ([place]) 08/15/2019   Constipation due to pain medication 07/14/2019   Sickle cell pain crisis ([place]) 01/11/2018   Sickle cell anemia with pain ([place]) 07/22/2017   Chronic musculoskeletal pain 03/30/2017   Avascular necrosis ([place]) 03/30/2017   Asthma, mild intermittent 03/30/2017   Diabetes ([place]) 02/07/2017   Functional asplenia 02/07/2017   Sickle cell disease, type [place] ([place]) 02/07/2017   Past Medical History:  Diagnosis Date   Diabetes mellitus without complication (HCC)    Sickle cell anemia (HCC)    Past Surgical History:  Procedure Laterality Date   CHOLECYSTECTOMY     Social History   Tobacco Use   Smoking status: Never   Smokeless tobacco: Never  Vaping Use   Vaping Use: Never used  Substance Use Topics   Alcohol use: No   Drug use: No   Social History   Socioeconomic History    Marital status: Married    Spouse name: Not on file   Number of children: Not on file   Years of education: Not on file   Highest education level: Not on file  Occupational History   Not on file  Tobacco Use   Smoking status: Never   Smokeless tobacco: Never  Vaping Use   Vaping Use: Never used  Substance and Sexual Activity   Alcohol use: No   Drug use: No   Sexual activity: Not Currently  Other Topics Concern   Not on file  Social History Narrative   Not on file   Social Determinants of Health   Financial Resource Strain: Not on file  Food Insecurity: Not on file  Transportation Needs: Not on file  Physical Activity: Not on file  Stress: Not on file  Social Connections: Not on file  Intimate Partner Violence: Not on file   Family Status  Relation Name Status   Mother  Alive   Father  Deceased   Family History  Problem Relation Age of Onset   Diabetes Mother    Allergies  Allergen Reactions   Aspirin Other (See Comments)    unknown   Sulfa Antibiotics Other (See Comments)    unknown      Review of Systems  Constitutional: Negative.   HENT: Negative.    Respiratory: Negative.    Cardiovascular: Negative.   Gastrointestinal: Negative.   Genitourinary: Negative.  Musculoskeletal: Negative.   Skin: Negative.   Neurological: Negative.   Psychiatric/Behavioral: Negative.        Objective:     BP (!) 150/99   Pulse 74   Temp 97.9 F (36.6 C)   Ht _0  (1.676 m)   Wt 160 lb (72.6 kg)   BMI 25.82 kg/m  BP Readings from Last 3 Encounters:  08/06/21 (!) 150/99  04/25/21 138/87  01/22/21 132/87   Wt Readings from Last 3 Encounters:  08/06/21 160 lb (72.6 kg)  04/25/21 168 lb 4 oz (76.3 kg)  01/22/21 169 lb (76.7 kg)      Physical Exam Constitutional:      Appearance: Normal appearance. He is normal weight.  HENT:     Mouth/Throat:     Mouth: Mucous membranes are moist.  Eyes:     Pupils: Pupils are equal, round, and reactive to  light.  Cardiovascular:     Rate and Rhythm: Normal rate and regular rhythm.     Pulses: Normal pulses.  Abdominal:     General: Bowel sounds are normal.  Musculoskeletal:        General: Normal range of motion.  Neurological:     Mental Status: He is alert. Mental status is at baseline.  Psychiatric:        Mood and Affect: Mood normal.        Behavior: Behavior normal.        Thought Content: Thought content normal.        Judgment: Judgment normal.      Results for orders placed or performed in visit on 08/06/21  Sickle Cell Panel  Result Value Ref Range   Glucose 137 (H) 70 - 99 mg/dL   BUN 9 6 - 24 mg/dL   Creatinine, Ser 0.81 0.76 - 1.27 mg/dL   eGFR 110 >59 mL/min/1.73   BUN/Creatinine Ratio 11 9 - 20   Sodium 136 134 - 144 mmol/L   Potassium 4.6 3.5 - 5.2 mmol/L   Chloride 100 96 - 106 mmol/L   CO2 17 (L) 20 - 29 mmol/L   Calcium 9.8 8.7 - 10.2 mg/dL   Total Protein 7.8 6.0 - 8.5 g/dL   Albumin 4.6 4.1 - 5.1 g/dL   Globulin, Total 3.2 1.5 - 4.5 g/dL   Albumin/Globulin Ratio 1.4 1.2 - 2.2   Bilirubin Total 3.0 (H) 0.0 - 1.2 mg/dL   Alkaline Phosphatase 104 44 - 121 IU/L   AST 24 0 - 40 IU/L   ALT 17 0 - 44 IU/L   Ferritin 771 (H) 30 - 400 ng/mL   Vit D, 25-Hydroxy 32.0 30.0 - 100.0 ng/mL   WBC 16.7 (H) 3.4 - 10.8 x10E3/uL   RBC 4.35 4.14 - 5.80 x10E6/uL   Hemoglobin 13.1 13.0 - 17.7 g/dL   Hematocrit 37.3 (L) 37.5 - 51.0 %   MCV 86 79 - 97 fL   MCH 30.1 26.6 - 33.0 pg   MCHC 35.1 31.5 - 35.7 g/dL   RDW 15.9 (H) 11.6 - 15.4 %   Platelets 411 150 - 450 x10E3/uL   Neutrophils 59 Not Estab. %   Lymphs 26 Not Estab. %   Monocytes 7 Not Estab. %   Eos 6 Not Estab. %   Basos 1 Not Estab. %   Neutrophils Absolute 10.0 (H) 1.4 - 7.0 x10E3/uL   Lymphocytes Absolute 4.3 (H) 0.7 - 3.1 x10E3/uL   Monocytes Absolute 1.1 (H) 0.1 - 0.9 x10E3/uL   EOS (  ABSOLUTE) 1.0 (H) 0.0 - 0.4 x10E3/uL   Basophils Absolute 0.2 0.0 - 0.2 x10E3/uL   Immature Granulocytes 1 Not  Estab. %   Immature Grans (Abs) 0.2 (H) 0.0 - 0.1 x10E3/uL   NRBC 1 (H) 0 - 0 %   Retic Ct Pct 7.1 (H) 0.6 - 2.6 %  Fructosamine  Result Value Ref Range   Fructosamine 474 (H) 0 - 285 umol/L    Last CBC Lab Results  Component Value Date   WBC 16.7 (H) 08/06/2021   HGB 13.1 08/06/2021   HCT 37.3 (L) 08/06/2021   MCV 86 08/06/2021   MCH 30.1 08/06/2021   RDW 15.9 (H) 08/06/2021   PLT 411 56/43/3295   Last metabolic panel Lab Results  Component Value Date   GLUCOSE 137 (H) 08/06/2021   NA 136 08/06/2021   K 4.6 08/06/2021   CL 100 08/06/2021   CO2 17 (L) 08/06/2021   BUN 9 08/06/2021   CREATININE 0.81 08/06/2021   EGFR 110 08/06/2021   CALCIUM 9.8 08/06/2021   PHOS 3.9 08/26/2020   PROT 7.8 08/06/2021   ALBUMIN 4.6 08/06/2021   LABGLOB 3.2 08/06/2021   AGRATIO 1.4 08/06/2021   BILITOT 3.0 (H) 08/06/2021   ALKPHOS 104 08/06/2021   AST 24 08/06/2021   ALT 17 08/06/2021   ANIONGAP 7 08/26/2020   Last lipids No results found for: "CHOL", "HDL", "LDLCALC", "LDLDIRECT", "TRIG", "CHOLHDL" Last hemoglobin A1c Lab Results  Component Value Date   HGBA1C 5.6 08/25/2020   Last thyroid functions No results found for: "TSH", "T3TOTAL", "T4TOTAL", "THYROIDAB" Last vitamin D Lab Results  Component Value Date   VD25OH 32.0 08/06/2021   Last vitamin B12 and Folate No results found for: "VITAMINB12", "FOLATE"    The ASCVD Risk score (Arnett DK, et al., 2019) failed to calculate for the following reasons:   Cannot find a previous HDL lab   Cannot find a previous total cholesterol lab    Assessment & Plan:   Problem List Items Addressed This Visit       Endocrine   Diabetes ([place])   Relevant Medications   lisinopril (ZESTRIL) 5 MG tablet   metFORMIN (GLUCOPHAGE) 1000 MG tablet   glipiZIDE (GLUCOTROL) 10 MG tablet   Other Relevant Orders   Fructosamine (Completed)     Other   Sickle cell anemia with pain (HCC)   Relevant Medications   ibuprofen (ADVIL) 188 MG  tablet   folic acid (FOLVITE) 1 MG tablet   Other Relevant Orders   Sickle Cell Panel (Completed)   Other Visit Diagnoses     Vitamin D deficiency    -  Primary   Relevant Orders   Sickle Cell Panel (Completed)   Essential hypertension       Relevant Medications   lisinopril (ZESTRIL) 5 MG tablet      1. Essential hypertension BP (!) 150/99   Pulse 74   Temp 97.9 F (36.6 C)   Ht _0  (1.676 m)   Wt 160 lb (72.6 kg)   BMI 25.82 kg/m  - Continue medication, monitor blood pressure at home. Continue DASH diet.  Reminder to go to the ER if any CP, SOB, nausea, dizziness, severe HA, changes vision/speech, left arm numbness and tingling and jaw pain.   - lisinopril (ZESTRIL) 5 MG tablet; Take 1 tablet (5 mg total) by mouth daily.  Dispense: 90 tablet; Refill: 1  2. Type 2 diabetes mellitus without complication, without long-term current use of insulin (HCC)  -  metFORMIN (GLUCOPHAGE) 1000 MG tablet; Take 1 tablet (1,000 mg total) by mouth 2 (two) times daily with a meal.  Dispense: 180 tablet; Refill: 0 - glipiZIDE (GLUCOTROL) 10 MG tablet; Take 1 tablet (10 mg total) by mouth daily before breakfast.  Dispense: 90 tablet; Refill: 2 - Fructosamine  3. Sickle cell anemia with pain (HCC)  - ibuprofen (ADVIL) 600 MG tablet; Take 1 tablet (600 mg total) by mouth every 8 (eight) hours as needed.  Dispense: 30 tablet; Refill: 5 - folic acid (FOLVITE) 1 MG tablet; Take 1 tablet (1 mg total) by mouth daily.  Dispense: 30 tablet; Refill: 5 - Sickle Cell Panel  4. Vitamin D deficiency  - Sickle Cell Panel  Return in about 3 months (around 11/06/2021) for sickle cell anemia.   Donia Pounds  APRN, MSN, FNP-C [facility] [address] [place], [place] [address] [phone]

## 2021-08-07 LAB — CMP14+CBC/D/PLT+FER+RETIC+V...
ALT: 17 IU/L (ref 0–44)
AST: 24 IU/L (ref 0–40)
Albumin/Globulin Ratio: 1.4 (ref 1.2–2.2)
Albumin: 4.6 g/dL (ref 4.1–5.1)
Alkaline Phosphatase: 104 IU/L (ref 44–121)
BUN/Creatinine Ratio: 11 (ref 9–20)
BUN: 9 mg/dL (ref 6–24)
Basophils Absolute: 0.2 10*3/uL (ref 0.0–0.2)
Basos: 1 %
Bilirubin Total: 3 mg/dL — ABNORMAL HIGH (ref 0.0–1.2)
CO2: 17 mmol/L — ABNORMAL LOW (ref 20–29)
Calcium: 9.8 mg/dL (ref 8.7–10.2)
Chloride: 100 mmol/L (ref 96–106)
Creatinine, Ser: 0.81 mg/dL (ref 0.76–1.27)
EOS (ABSOLUTE): 1 10*3/uL — ABNORMAL HIGH (ref 0.0–0.4)
Eos: 6 %
Ferritin: 771 ng/mL — ABNORMAL HIGH (ref 30–400)
Globulin, Total: 3.2 g/dL (ref 1.5–4.5)
Glucose: 137 mg/dL — ABNORMAL HIGH (ref 70–99)
Hematocrit: 37.3 % — ABNORMAL LOW (ref 37.5–51.0)
Hemoglobin: 13.1 g/dL (ref 13.0–17.7)
Immature Grans (Abs): 0.2 10*3/uL — ABNORMAL HIGH (ref 0.0–0.1)
Immature Granulocytes: 1 %
Lymphocytes Absolute: 4.3 10*3/uL — ABNORMAL HIGH (ref 0.7–3.1)
Lymphs: 26 %
MCH: 30.1 pg (ref 26.6–33.0)
MCHC: 35.1 g/dL (ref 31.5–35.7)
MCV: 86 fL (ref 79–97)
Monocytes Absolute: 1.1 10*3/uL — ABNORMAL HIGH (ref 0.1–0.9)
Monocytes: 7 %
NRBC: 1 % — ABNORMAL HIGH (ref 0–0)
Neutrophils Absolute: 10 10*3/uL — ABNORMAL HIGH (ref 1.4–7.0)
Neutrophils: 59 %
Platelets: 411 10*3/uL (ref 150–450)
Potassium: 4.6 mmol/L (ref 3.5–5.2)
RBC: 4.35 x10E6/uL (ref 4.14–5.80)
RDW: 15.9 % — ABNORMAL HIGH (ref 11.6–15.4)
Retic Ct Pct: 7.1 % — ABNORMAL HIGH (ref 0.6–2.6)
Sodium: 136 mmol/L (ref 134–144)
Total Protein: 7.8 g/dL (ref 6.0–8.5)
Vit D, 25-Hydroxy: 32 ng/mL (ref 30.0–100.0)
WBC: 16.7 10*3/uL — ABNORMAL HIGH (ref 3.4–10.8)
eGFR: 110 mL/min/{1.73_m2} (ref >59)

## 2021-08-07 LAB — FRUCTOSAMINE: Fructosamine: 474 umol/L — ABNORMAL HIGH (ref 0–285)

## 2021-08-25 ENCOUNTER — Encounter: Payer: Self-pay | Admitting: Family Medicine

## 2021-09-02 ENCOUNTER — Other Ambulatory Visit: Payer: Self-pay | Admitting: Family Medicine

## 2021-09-02 DIAGNOSIS — E119 Type 2 diabetes mellitus without complications: Secondary | ICD-10-CM

## 2021-10-28 DIAGNOSIS — E559 Vitamin D deficiency, unspecified: Secondary | ICD-10-CM | POA: Insufficient documentation

## 2021-11-05 ENCOUNTER — Ambulatory Visit (INDEPENDENT_AMBULATORY_CARE_PROVIDER_SITE_OTHER): Payer: Self-pay | Admitting: Family Medicine

## 2021-11-05 ENCOUNTER — Encounter: Payer: Self-pay | Admitting: Family Medicine

## 2021-11-05 VITALS — BP 157/94 | HR 65 | Temp 97.7°F | Ht 66.0 in | Wt 166.0 lb

## 2021-11-05 DIAGNOSIS — M7918 Myalgia, other site: Secondary | ICD-10-CM

## 2021-11-05 DIAGNOSIS — D5700 Hb-SS disease with crisis, unspecified: Secondary | ICD-10-CM

## 2021-11-05 DIAGNOSIS — G8929 Other chronic pain: Secondary | ICD-10-CM

## 2021-11-05 DIAGNOSIS — E119 Type 2 diabetes mellitus without complications: Secondary | ICD-10-CM

## 2021-11-05 DIAGNOSIS — I10 Essential (primary) hypertension: Secondary | ICD-10-CM

## 2021-11-05 LAB — POCT URINALYSIS DIP (CLINITEK)
Bilirubin, UA: NEGATIVE
Blood, UA: NEGATIVE
Glucose, UA: NEGATIVE mg/dL
Ketones, POC UA: NEGATIVE mg/dL
Leukocytes, UA: NEGATIVE
Nitrite, UA: NEGATIVE
POC PROTEIN,UA: NEGATIVE
Spec Grav, UA: 1.015 (ref 1.010–1.025)
Urobilinogen, UA: 1 E.U./dL
pH, UA: 6 (ref 5.0–8.0)

## 2021-11-05 LAB — POCT GLYCOSYLATED HEMOGLOBIN (HGB A1C): HbA1c, POC (controlled diabetic range): 5.5 % (ref 0.0–7.0)

## 2021-11-05 MED ORDER — FOLIC ACID 1 MG PO TABS: 1.0000 mg | ORAL_TABLET | Freq: Every day | ORAL | 5 refills | Status: DC

## 2021-11-05 MED ORDER — OXYCODONE HCL 10 MG PO TABS: 10.0000 mg | ORAL_TABLET | ORAL | 0 refills | Status: DC | PRN

## 2021-11-05 MED ORDER — METFORMIN HCL 1000 MG PO TABS: 1000.0000 mg | ORAL_TABLET | Freq: Two times a day (BID) | ORAL | 2 refills | Status: DC

## 2021-11-05 MED ORDER — GLIPIZIDE 10 MG PO TABS: 10.0000 mg | ORAL_TABLET | Freq: Every day | ORAL | 2 refills | Status: DC

## 2021-11-05 MED ORDER — LISINOPRIL 5 MG PO TABS: 5.0000 mg | ORAL_TABLET | Freq: Every day | ORAL | 1 refills | Status: DC

## 2021-11-05 NOTE — Progress Notes (Signed)
Established Patient Office Visit  Subjective   Patient ID: Gregory Bryant, male    DOB: 1975-07-15  Age: 46 y.o. MRN: [id number]  Chief Complaint  Patient presents with   Diabetes    No concerns.    Hypertension    Gregory Bryant is a 46 year old male with a medical history significant for sickle cell disease, type 2 diabetes mellitus, opiate dependence and tolerance, history of anemia of chronic disease, and hyperlipidemia presents for follow-up of chronic disease.  Patient states that he has been feeling well and is without complaint on today.  He is not having any pain.  Chronic low back pain is well controlled on oxycodone.  Patient is requesting refills on all medications today.  He has been taking medications consistently.  Diabetes He presents for his follow-up diabetic visit. He has type 2 diabetes mellitus. His disease course has been stable. Pertinent negatives for hypoglycemia include no sweats. Pertinent negatives for diabetes include no blurred vision, no chest pain, no fatigue, no polydipsia, no polyphagia, no polyuria, no weakness and no weight loss. Risk factors for coronary artery disease include male sex and sedentary lifestyle. Current diabetic treatment includes oral agent (dual therapy). He is compliant with treatment all of the time.  Hypertension This is a chronic problem. The problem is controlled. Pertinent negatives include no blurred vision, chest pain, neck pain, orthopnea, palpitations, peripheral edema, PND, shortness of breath or sweats.    Patient Active Problem List   Diagnosis Date Noted   Vitamin D deficiency 10/28/2021   COVID 08/25/2020   Uncontrolled type 2 diabetes mellitus with hyperglycemia ([place]) 08/15/2019   Constipation due to pain medication 07/14/2019   Sickle cell pain crisis (Gregory Bryant) 01/11/2018   Sickle cell anemia with pain ([place]) 07/22/2017   Chronic musculoskeletal pain 03/30/2017   Avascular necrosis (Gregory Bryant) 03/30/2017   Asthma, mild  intermittent 03/30/2017   Diabetes (Gregory Bryant) 02/07/2017   Functional asplenia 02/07/2017   Sickle cell disease, type [place] (Gregory Bryant) 02/07/2017   Past Medical History:  Diagnosis Date   Diabetes mellitus without complication (HCC)    Sickle cell anemia (HCC)    Past Surgical History:  Procedure Laterality Date   CHOLECYSTECTOMY     Social History   Tobacco Use   Smoking status: Never   Smokeless tobacco: Never  Vaping Use   Vaping Use: Never used  Substance Use Topics   Alcohol use: No   Drug use: No   Social History   Socioeconomic History   Marital status: Married    Spouse name: Not on file   Number of children: Not on file   Years of education: Not on file   Highest education level: Not on file  Occupational History   Not on file  Tobacco Use   Smoking status: Never   Smokeless tobacco: Never  Vaping Use   Vaping Use: Never used  Substance and Sexual Activity   Alcohol use: No   Drug use: No   Sexual activity: Not Currently  Other Topics Concern   Not on file  Social History Narrative   Not on file   Social Determinants of Health   Financial Resource Strain: Not on file  Food Insecurity: Not on file  Transportation Needs: Not on file  Physical Activity: Not on file  Stress: Not on file  Social Connections: Not on file  Intimate Partner Violence: Not on file   Family Status  Relation Name Status   Mother  Alive  Father  Deceased   Family History  Problem Relation Age of Onset   Diabetes Mother    Allergies  Allergen Reactions   Aspirin Other (See Comments)    unknown   Sulfa Antibiotics Other (See Comments)    unknown      Review of Systems  Constitutional:  Negative for fatigue and weight loss.  Eyes:  Negative for blurred vision.  Respiratory:  Negative for shortness of breath.   Cardiovascular:  Negative for chest pain, palpitations, orthopnea and PND.  Musculoskeletal:  Negative for neck pain.  Neurological:  Negative for weakness.   Endo/Heme/Allergies:  Negative for polydipsia and polyphagia.      Objective:     BP (!) 157/94   Pulse 65   Temp 97.7 F (36.5 C) (Temporal)   Ht 5' 6"  (1.676 m)   Wt 166 lb (75.3 kg)   SpO2 98%   BMI 26.79 kg/m  BP Readings from Last 3 Encounters:  11/05/21 (!) 157/94  08/06/21 (!) 150/99  04/25/21 138/87   Wt Readings from Last 3 Encounters:  11/05/21 166 lb (75.3 kg)  08/06/21 160 lb (72.6 kg)  04/25/21 168 lb 4 oz (76.3 kg)      Physical Exam Constitutional:      Appearance: Normal appearance.  Cardiovascular:     Rate and Rhythm: Normal rate and regular rhythm.     Pulses: Normal pulses.  Pulmonary:     Effort: Pulmonary effort is normal.     Breath sounds: Normal breath sounds.  Abdominal:     General: Abdomen is flat. Bowel sounds are normal.  Musculoskeletal:        General: Normal range of motion.  Skin:    General: Skin is warm.  Neurological:     General: No focal deficit present.     Mental Status: He is alert. Mental status is at baseline.  Psychiatric:        Mood and Affect: Mood normal.        Behavior: Behavior normal.        Thought Content: Thought content normal.        Judgment: Judgment normal.      Results for orders placed or performed in visit on 11/05/21  POCT glycosylated hemoglobin (Hb A1C)  Result Value Ref Range   Hemoglobin A1C     HbA1c POC (<> result, manual entry)     HbA1c, POC (prediabetic range)     HbA1c, POC (controlled diabetic range) 5.5 0.0 - 7.0 %  POCT URINALYSIS DIP (CLINITEK)  Result Value Ref Range   Color, UA yellow yellow   Clarity, UA clear clear   Glucose, UA negative negative mg/dL   Bilirubin, UA negative negative   Ketones, POC UA negative negative mg/dL   Spec Grav, UA 1.015 1.010 - 1.025   Blood, UA negative negative   pH, UA 6.0 5.0 - 8.0   POC PROTEIN,UA negative negative, trace   Urobilinogen, UA 1.0 0.2 or 1.0 E.U./dL   Nitrite, UA Negative Negative   Leukocytes, UA Negative  Negative    Last CBC Lab Results  Component Value Date   WBC 16.7 (H) 08/06/2021   HGB 13.1 08/06/2021   HCT 37.3 (L) 08/06/2021   MCV 86 08/06/2021   MCH 30.1 08/06/2021   RDW 15.9 (H) 08/06/2021   PLT 411 48/18/5909   Last metabolic panel Lab Results  Component Value Date   GLUCOSE 137 (H) 08/06/2021   NA 136 08/06/2021  K 4.6 08/06/2021   CL 100 08/06/2021   CO2 17 (L) 08/06/2021   BUN 9 08/06/2021   CREATININE 0.81 08/06/2021   EGFR 110 08/06/2021   CALCIUM 9.8 08/06/2021   PHOS 3.9 08/26/2020   PROT 7.8 08/06/2021   ALBUMIN 4.6 08/06/2021   LABGLOB 3.2 08/06/2021   AGRATIO 1.4 08/06/2021   BILITOT 3.0 (H) 08/06/2021   ALKPHOS 104 08/06/2021   AST 24 08/06/2021   ALT 17 08/06/2021   ANIONGAP 7 08/26/2020   Last lipids No results found for: "CHOL", "HDL", "LDLCALC", "LDLDIRECT", "TRIG", "CHOLHDL" Last hemoglobin A1c Lab Results  Component Value Date   HGBA1C 5.5 11/05/2021   Last thyroid functions No results found for: "TSH", "T3TOTAL", "T4TOTAL", "THYROIDAB" Last vitamin D Lab Results  Component Value Date   VD25OH 32.0 08/06/2021   Last vitamin B12 and Folate No results found for: "VITAMINB12", "FOLATE"    The ASCVD Risk score (Arnett DK, et al., 2019) failed to calculate for the following reasons:   Cannot find a previous HDL lab   Cannot find a previous total cholesterol lab    Assessment & Plan:   Problem List Items Addressed This Visit       Endocrine   Diabetes ([place]) - Primary   Relevant Medications   lisinopril (ZESTRIL) 5 MG tablet   metFORMIN (GLUCOPHAGE) 1000 MG tablet   glipiZIDE (GLUCOTROL) 10 MG tablet   Other Relevant Orders   POCT glycosylated hemoglobin (Hb A1C) (Completed)   166063 11+Oxyco+Alc+Crt-Bund   POCT URINALYSIS DIP (CLINITEK) (Completed)     Other   Sickle cell anemia with pain (HCC)   Relevant Medications   folic acid (FOLVITE) 1 MG tablet   Chronic musculoskeletal pain   Relevant Medications    Oxycodone HCl 10 MG TABS   Other Visit Diagnoses     Essential hypertension       Relevant Medications   lisinopril (ZESTRIL) 5 MG tablet   Other Relevant Orders   016010 11+Oxyco+Alc+Crt-Bund       Return in about 3 months (around 02/05/2022) for diabetes, sickle cell anemia, hypertension, hyperlipidemia.    Donia Pounds  APRN, MSN, FNP-C Patient [facility] [address] [place], [place] [address] [phone]

## 2021-11-05 NOTE — Patient Instructions (Signed)
Living With Sickle Cell Disease Living with a long-term condition, such as sickle cell disease, can be a challenge. It can affect both your physical and mental health. You may not have total control over your condition. But proper care and treatment can help manage the effects of the disease so you can feel good and lead an active life. You can take steps to manage your condition and stay as healthy as possible. How does sickle cell disease affect me? Sickle cell disease can cause challenges that affect your quality of life. You may get sick more often as a result of organ damage and infections. Sometimes you may need to stay in the hospital. Learn how to recognize that you are not feeling well and that you may be getting sick. What actions can I take to manage my condition?  The goals of treatment are to control your symptoms and prevent and treat problems. Work with your health care provider to create a treatment plan that works for you. Taking an active role in managing your condition can help you feel more in control of your situation. Ask about possible side effects of medicines that your health care provider recommends. Discuss how you feel about having those side effects. Keeping a healthy lifestyle can help you manage your condition. This includes eating a healthy diet, getting enough sleep, and getting regular exercise. Sickle cell disease may affect your ability to take care of your basic needs. Tell your health care provider if you have concerns about any of these needs: Access to food. Housing. Safe drinking water and other utilities. Safety in your home and community. Work or school. Transportation. Paying for health care. Your health care provider may be able to connect you with community resources that can help you. How to manage stress  Living with sickle cell disease can be stressful. This disease can have a big impact on your mental health. Talk with your health care provider  about ways to reduce your stress or if you have concerns about your mental health.  To cope with stress, try: Keeping a stress diary. This can help you learn what causes your stress to start (figure out your triggers) and how to control your response to those triggers. Spending time doing things that you enjoy, such as: Hobbies. Being outdoors. Spending time with friends and people who make you laugh. Doing yoga, muscle relaxation, deep breathing, or mindfulness practices. Expressing yourself through journal writing, art, crafting, poetry, or playing music. Staying positive about your health. Try to accept that you cannot control your condition perfectly. Follow these instructions at home: Medicines Take over-the-counter and prescription medicines only as told by your health care provider. If you were prescribed antibiotics, take them as told by your health care provider. Do not stop taking them even if you start to feel better. If you develop a fever, do not take medicines to reduce the fever right away. This could cover up another problem. Contact your health care provider. Eating and drinking Drink enough fluid to keep your urine pale yellow. Drink more in hot weather and during exercise. Limit or avoid drinking alcohol. Eat a balanced and nutritious diet. Eat plenty of fruits, vegetables, whole grains, and lean protein. Take vitamins and supplements as told by your health care provider. Traveling When traveling, keep these with you: Your medical information. The names of your health care providers. Your medicines. If you have to travel by air, ask about precautions you should take. Managing pain Work with   your health care provider to create a pain management plan that works for you. The plan may include: Ways to reduce or manage your pain at home, such as: Using a heating pad. Taking a warm bath. Using healthy ways to distract you from the pain, such as hobbies or  reading. Practicing ways to relax, such as doing yoga or listening to music. Getting massages. Doing exercises or stretches as told by a physical therapist. Tracking how pain affects your daily life functions. When to seek help. Who to contact and what to do in case of a pain emergency. General instructions Do not use any products that contain nicotine or tobacco. These products include cigarettes, chewing tobacco, and vaping devices, such as e-cigarettes. These lower blood oxygen levels. If you need help quitting, ask your health care provider. Consider wearing a medical alert bracelet. Use an app or journal to track your symptoms, assess your level of pain and fatigue, and keep track of your medicines. Avoid the following: High altitudes. Very high or low temperatures and big changes in temperature. Activities that will lower your oxygen levels, such as mountain climbing or doing exercise that takes a lot of effort. Stay up to date on: Your treatment plan. Learn as much as you can about your condition. Health screenings. This will help prevent problems or catch them early on. Vaccines. This will help prevent infection. Wash your hands often with soap and water to help prevent infections. Wash them for at least 20 seconds each time. Keep all follow-up visits. Regular follow-up with your health care provider can help you better manage your condition. Where to find support You can find help and support through: Talking with a therapist or taking part in support groups. Sickle Cell [employer] [place]: www.sicklecelldisease.org Where to find more information Centers for Disease Control and Prevention: www.cdc.gov American Society of Hematology: www.hematology.org Contact a health care provider if: Your symptoms get worse. You have new symptoms. You have a fever. Get help right away if: You have a painful erection of the penis that lasts a long time (priapism). You become  short of breath or are having trouble breathing. You have pain that cannot be controlled with medicine. You have any signs of a stroke. "BE FAST" is an easy way to remember the main warning signs: B - Balance. Dizziness, sudden trouble walking, or loss of balance. E - Eyes. Trouble seeing or a change in how you see. F - Face. Sudden weakness or loss of feeling of the face. The face or eyelid may droop on one side. A - Arms. Weakness or loss of feeling in an arm. This happens all of a sudden and most often on one side of the body. S - Speech. Sudden trouble speaking, slurred speech, or trouble understanding what people say. T - Time. Time to call emergency services. Write down what time symptoms started. You have other signs of a stroke, such as: A sudden, very bad headache with no known cause. Feeling like you may vomit (nausea). Vomiting. Seizure. These symptoms may be an emergency. Get help right away. Call 911. Do not wait to see if the symptoms will go away. Do not drive yourself to the hospital. Also, get help right away if: You have strong feelings of sadness or loss of hope, or you have thoughts about hurting yourself or others. Take one of these steps if you feel like you may hurt yourself or others, or have thoughts about taking your own life:   Go to your nearest emergency room. Call 911. Call the National Suicide Prevention Lifeline at 1-800-273-8255 or 988. This is open 24 hours a day. Text the Crisis Text Line at 741741. Summary Proper care and treatment can help manage the effects of sickle cell disease so you can feel good and lead an active life. The goals of treatment are to control your symptoms and prevent and treat problems. Taking an active role in managing your condition can help you feel more in control of your situation. Work with your health care provider to create a pain management plan that works for you. Get medical help right away as told by your health care  provider. This information is not intended to replace advice given to you by your health care provider. Make sure you discuss any questions you have with your health care provider. Document Revised: 04/22/2021 Document Reviewed: 04/22/2021 Elsevier Patient Education  2023 [employer]  

## 2021-11-06 LAB — DRUG SCREEN 764883 11+OXYCO+ALC+CRT-BUND
Amphetamines, Urine: NEGATIVE ng/mL
BENZODIAZ UR QL: NEGATIVE ng/mL
Barbiturate: NEGATIVE ng/mL
Cannabinoid Quant, Ur: NEGATIVE ng/mL
Cocaine (Metabolite): NEGATIVE ng/mL
Creatinine: 64.1 mg/dL (ref 20.0–300.0)
Ethanol: NEGATIVE %
Meperidine: NEGATIVE ng/mL
Methadone Screen, Urine: NEGATIVE ng/mL
OPIATE SCREEN URINE: NEGATIVE ng/mL
Oxycodone/Oxymorphone, Urine: NEGATIVE ng/mL
Phencyclidine: NEGATIVE ng/mL
Propoxyphene: NEGATIVE ng/mL
Tramadol: NEGATIVE ng/mL
pH, Urine: 5.6 (ref 4.5–8.9)

## 2022-01-06 ENCOUNTER — Telehealth: Payer: Self-pay

## 2022-01-06 NOTE — Telephone Encounter (Signed)
Transition Care Management Unsuccessful Follow-up Telephone Call  Date of discharge and from where:  12/ 2/23  Attempts:  1st Attempt  Reason for unsuccessful TCM follow-up call:  Left voice message  Elyse Jarvis RMA

## 2022-01-07 NOTE — Telephone Encounter (Signed)
Error

## 2022-01-08 ENCOUNTER — Telehealth: Payer: Self-pay

## 2022-01-08 NOTE — Telephone Encounter (Signed)
Transition Care Management Follow-up Telephone Call Date of discharge and from where: 12/28/2021 How have you been since you were released from the hospital? Per pt he is not back to 100 % Any questions or concerns? No  Items Reviewed: Did the pt receive and understand the discharge instructions provided? Yes  Medications obtained and verified? Yes  Other? No  Any new allergies since your discharge? No  Dietary orders reviewed? No Do you have support at home? Yes    Follow up appointments reviewed:  PCP Hospital f/u appt confirmed? Yes  Scheduled to see Fola on 01/15/22 @ 3 pm. [facility] f/u appt confirmed? No   Are transportation arrangements needed? No  If their condition worsens, is the pt aware to call PCP or go to the Emergency Dept.? Yes Was the patient provided with contact information for the PCP's office or ED? Yes Was to pt encouraged to call back with questions or concerns? Yes    Elyse Jarvis RMA

## 2022-01-15 ENCOUNTER — Ambulatory Visit (INDEPENDENT_AMBULATORY_CARE_PROVIDER_SITE_OTHER): Payer: Medicaid Other | Admitting: Nurse Practitioner

## 2022-01-15 ENCOUNTER — Encounter: Payer: Self-pay | Admitting: Nurse Practitioner

## 2022-01-15 VITALS — BP 138/73 | HR 64 | Temp 98.1°F | Ht 66.0 in | Wt 163.6 lb

## 2022-01-15 DIAGNOSIS — Z1211 Encounter for screening for malignant neoplasm of colon: Secondary | ICD-10-CM

## 2022-01-15 DIAGNOSIS — U071 COVID-19: Secondary | ICD-10-CM

## 2022-01-15 DIAGNOSIS — Z23 Encounter for immunization: Secondary | ICD-10-CM | POA: Diagnosis not present

## 2022-01-15 DIAGNOSIS — Z09 Encounter for follow-up examination after completed treatment for conditions other than malignant neoplasm: Secondary | ICD-10-CM

## 2022-01-15 NOTE — Assessment & Plan Note (Signed)
Patient educated on CDC recommendation for the Influenza vaccine. Verbal consent was obtained from the patient, vaccine administered by nurse, no sign of adverse reactions noted at this time. Patient education on arm soreness and use of tylenol orfor this patient  was discussed. Patient educated on the signs and symptoms of adverse effect and advise to contact the office if they occur.

## 2022-01-15 NOTE — Assessment & Plan Note (Signed)
presents for follow-up for visit to the emergency room on 12/27/2021 for COVID .  Patient stated that he had presented to the emergency room for complaints of cough, congestion, generalized weakness that started around Thanksgiving.  Chest x-ray done at the emergency room showsCardiovascular: Cardiac silhouette is within normal limits. Mediastinum: Within normal limits. Lungs/pleura: No focal airspace opacities. No pleural effusions. No pneumothorax. Upper abdomen: Visualized upper abdomen is within normal limits. Chest wall/osseous structures: No acute osseous abnormalities. Today patient denies chest pain, shortness of breath, cough, wheezing, malaise states that his symptoms has completely resolved.  He denies any complaints today

## 2022-01-15 NOTE — Progress Notes (Signed)
Established Patient Office Visit  Subjective:  Patient ID: Gregory Bryant, male    DOB: 1975/11/16  Age: 46 y.o. MRN: [id number]  CC:  Chief Complaint  Patient presents with   Follow-up    covid    HPI Gregory Bryant is a 46 y.o. male with past medical history of type 2 diabetes, sickle cell disease, avascular necrosis, hypertension presents for follow-up for visit to the emergency room on 12/27/2021 for COVID .  Patient stated that he had presented to the emergency room for complaints of cough, congestion, generalized weakness that started around Thanksgiving.  Chest x-ray done at the emergency room showsCardiovascular: Cardiac silhouette is within normal limits. Mediastinum: Within normal limits. Lungs/pleura: No focal airspace opacities. No pleural effusions. No pneumothorax. Upper abdomen: Visualized upper abdomen is within normal limits. Chest wall/osseous structures: No acute osseous abnormalities. Today patient denies chest pain, shortness of breath, cough, wheezing, malaise states that his symptoms has completely resolved.  Due for flu vaccine flu vaccine was administered in the office today He was encouraged to consider getting the COVID-vaccine as he has not been vaccinated. Cologuard ordered to test for colon cancer.     Past Medical History:  Diagnosis Date   Diabetes mellitus without complication (HCC)    Sickle cell anemia (HCC)     Past Surgical History:  Procedure Laterality Date   CHOLECYSTECTOMY      Family History  Problem Relation Age of Onset   Diabetes Mother     Social History   Socioeconomic History   Marital status: Married    Spouse name: Not on file   Number of children: Not on file   Years of education: Not on file   Highest education level: Not on file  Occupational History   Not on file  Tobacco Use   Smoking status: Never   Smokeless tobacco: Never  Vaping Use   Vaping Use: Never used  Substance and Sexual Activity    Alcohol use: No   Drug use: No   Sexual activity: Not Currently  Other Topics Concern   Not on file  Social History Narrative   Not on file   Social Determinants of Health   Financial Resource Strain: Not on file  Food Insecurity: Not on file  Transportation Needs: Not on file  Physical Activity: Not on file  Stress: Not on file  Social Connections: Not on file  Intimate Partner Violence: Not on file    Outpatient Medications Prior to Visit  Medication Sig Dispense Refill   folic acid (FOLVITE) 1 MG tablet Take 1 tablet (1 mg total) by mouth daily. 30 tablet 5   glipiZIDE (GLUCOTROL) 10 MG tablet Take 1 tablet (10 mg total) by mouth daily before breakfast. 90 tablet 2   ibuprofen (ADVIL) 600 MG tablet Take 1 tablet (600 mg total) by mouth every 8 (eight) hours as needed. 30 tablet 5   lisinopril (ZESTRIL) 5 MG tablet Take 1 tablet (5 mg total) by mouth daily. 90 tablet 1   metFORMIN (GLUCOPHAGE) 1000 MG tablet Take 1 tablet (1,000 mg total) by mouth 2 (two) times daily with a meal. 180 tablet 2   ondansetron (ZOFRAN ODT) 8 MG disintegrating tablet Take 1 tablet (8 mg total) by mouth every 8 (eight) hours as needed for nausea or vomiting. 20 tablet 0   Oxycodone HCl 10 MG TABS Take 1 tablet (10 mg total) by mouth every 4 (four) hours as needed. 60 tablet 0   Vitamin D,  Ergocalciferol, (DRISDOL) 1.25 MG (50000 UNIT) CAPS capsule Take 1 capsule (50,000 Units total) by mouth every 7 (seven) days. Mondays 12 capsule 3   No facility-administered medications prior to visit.    Allergies  Allergen Reactions   Aspirin Other (See Comments)    unknown   Sulfa Antibiotics Other (See Comments)    unknown    ROS Review of Systems  Constitutional:  Negative for activity change, appetite change, chills, diaphoresis, fatigue and fever.  HENT:  Negative for congestion, dental problem, drooling and ear discharge.   Respiratory: Negative.  Negative for apnea, cough, choking, chest tightness,  shortness of breath, wheezing and stridor.   Cardiovascular: Negative.  Negative for chest pain, palpitations and leg swelling.  Gastrointestinal:  Negative for abdominal distention, abdominal pain, anal bleeding and blood in stool.  Neurological:  Negative for dizziness, facial asymmetry, light-headedness, numbness and headaches.  Psychiatric/Behavioral:  Negative for agitation, behavioral problems, confusion, decreased concentration and dysphoric mood.       Objective:    Physical Exam Constitutional:      General: He is not in acute distress.    Appearance: Normal appearance. He is not ill-appearing, toxic-appearing or diaphoretic.  Eyes:     General: Scleral icterus present.     Extraocular Movements: Extraocular movements intact.  Cardiovascular:     Rate and Rhythm: Normal rate and regular rhythm.     Pulses: Normal pulses.     Heart sounds: Normal heart sounds. No murmur heard.    No friction rub. No gallop.  Pulmonary:     Effort: Pulmonary effort is normal. No respiratory distress.     Breath sounds: Normal breath sounds. No stridor. No wheezing, rhonchi or rales.  Chest:     Chest wall: No tenderness.  Abdominal:     General: There is no distension.     Palpations: Abdomen is soft.     Tenderness: There is no abdominal tenderness. There is no guarding.  Musculoskeletal:        General: No swelling, tenderness, deformity or signs of injury.  Skin:    General: Skin is warm and dry.  Neurological:     Mental Status: He is alert and oriented to person, place, and time.     Cranial Nerves: No cranial nerve deficit.     Motor: No weakness.     Coordination: Coordination normal.     Gait: Gait normal.  Psychiatric:        Mood and Affect: Mood normal.        Behavior: Behavior normal.        Thought Content: Thought content normal.        Judgment: Judgment normal.     BP 138/73   Pulse 64   Temp 98.1 F (36.7 C)   Ht _0  (1.676 m)   Wt 163 lb 9.6 oz (74.2  kg)   SpO2 100%   BMI 26.41 kg/m  Wt Readings from Last 3 Encounters:  01/15/22 163 lb 9.6 oz (74.2 kg)  11/05/21 166 lb (75.3 kg)  08/06/21 160 lb (72.6 kg)    No results found for: "TSH" Lab Results  Component Value Date   WBC 16.7 (H) 08/06/2021   HGB 13.1 08/06/2021   HCT 37.3 (L) 08/06/2021   MCV 86 08/06/2021   PLT 411 08/06/2021   Lab Results  Component Value Date   NA 136 08/06/2021   K 4.6 08/06/2021   CO2 17 (L) 08/06/2021   GLUCOSE 137 (  H) 08/06/2021   BUN 9 08/06/2021   CREATININE 0.81 08/06/2021   BILITOT 3.0 (H) 08/06/2021   ALKPHOS 104 08/06/2021   AST 24 08/06/2021   ALT 17 08/06/2021   PROT 7.8 08/06/2021   ALBUMIN 4.6 08/06/2021   CALCIUM 9.8 08/06/2021   ANIONGAP 7 08/26/2020   EGFR 110 08/06/2021   No results found for: "CHOL" No results found for: "HDL" No results found for: "LDLCALC" No results found for: "TRIG" No results found for: "CHOLHDL" Lab Results  Component Value Date   HGBA1C 5.5 11/05/2021      Assessment & Plan:   Problem List Items Addressed This Visit       Other   COVID - Primary    Tested positive on 12/27/2021, COVID now resolved Patient denies any cough, body aches, shortness of breath, wheezing, chest pain He was encouraged to consider getting COVID-vaccine to prevent future COVID-19 infection as he has not been vaccinated      Need for immunization against influenza    Patient educated on CDC recommendation for the Influenza vaccine. Verbal consent was obtained from the patient, vaccine administered by nurse, no sign of adverse reactions noted at this time. Patient education on arm soreness and use of tylenol orfor this patient  was discussed. Patient educated on the signs and symptoms of adverse effect and advise to contact the office if they occur.       Relevant Orders   Flu Vaccine QUAD 8moIM (Fluarix, Fluzone & Alfiuria Quad PF) (Completed)   Screening for colon cancer   Relevant Orders   Cologuard    Encounter for examination following treatment at hospital     presents for follow-up for visit to the emergency room on 12/27/2021 for COVID .  Patient stated that he had presented to the emergency room for complaints of cough, congestion, generalized weakness that started around Thanksgiving.  Chest x-ray done at the emergency room showsCardiovascular: Cardiac silhouette is within normal limits. Mediastinum: Within normal limits. Lungs/pleura: No focal airspace opacities. No pleural effusions. No pneumothorax. Upper abdomen: Visualized upper abdomen is within normal limits. Chest wall/osseous structures: No acute osseous abnormalities. Today patient denies chest pain, shortness of breath, cough, wheezing, malaise states that his symptoms has completely resolved.  He denies any complaints today       No orders of the defined types were placed in this encounter.  Follow-up as planned with PCP for his chronic medical conditions Follow-up: No follow-ups on file.    FRenee Rival FNP

## 2022-01-15 NOTE — Assessment & Plan Note (Addendum)
Tested positive on 12/27/2021, COVID now resolved Patient denies any cough, body aches, shortness of breath, wheezing, chest pain He was encouraged to consider getting COVID-vaccine to prevent future COVID-19 infection as he has not been vaccinated

## 2022-01-15 NOTE — Patient Instructions (Signed)

## 2022-02-04 ENCOUNTER — Ambulatory Visit (INDEPENDENT_AMBULATORY_CARE_PROVIDER_SITE_OTHER): Payer: Medicaid Other | Admitting: Family Medicine

## 2022-02-04 ENCOUNTER — Encounter: Payer: Self-pay | Admitting: Family Medicine

## 2022-02-04 VITALS — BP 119/73 | HR 89 | Temp 97.8°F | Ht 66.0 in | Wt 160.8 lb

## 2022-02-04 DIAGNOSIS — E559 Vitamin D deficiency, unspecified: Secondary | ICD-10-CM | POA: Diagnosis not present

## 2022-02-04 DIAGNOSIS — I10 Essential (primary) hypertension: Secondary | ICD-10-CM

## 2022-02-04 DIAGNOSIS — Z1211 Encounter for screening for malignant neoplasm of colon: Secondary | ICD-10-CM

## 2022-02-04 DIAGNOSIS — D5700 Hb-SS disease with crisis, unspecified: Secondary | ICD-10-CM | POA: Diagnosis not present

## 2022-02-04 DIAGNOSIS — M7918 Myalgia, other site: Secondary | ICD-10-CM

## 2022-02-04 DIAGNOSIS — G8929 Other chronic pain: Secondary | ICD-10-CM

## 2022-02-04 DIAGNOSIS — E119 Type 2 diabetes mellitus without complications: Secondary | ICD-10-CM

## 2022-02-04 MED ORDER — OXYCODONE HCL 10 MG PO TABS: 10.0000 mg | ORAL_TABLET | ORAL | 0 refills | Status: DC | PRN

## 2022-02-05 LAB — MICROALBUMIN / CREATININE URINE RATIO
Creatinine, Urine: 67.3 mg/dL
Microalb/Creat Ratio: 16 mg/g creat (ref 0–29)
Microalbumin, Urine: 10.8 ug/mL

## 2022-02-05 LAB — FRUCTOSAMINE: Fructosamine: 467 umol/L — ABNORMAL HIGH (ref 0–285)

## 2022-02-07 ENCOUNTER — Other Ambulatory Visit: Payer: Self-pay

## 2022-02-07 DIAGNOSIS — M7918 Myalgia, other site: Secondary | ICD-10-CM

## 2022-02-07 NOTE — Telephone Encounter (Signed)
Please advise KH 

## 2022-02-11 MED ORDER — OXYCODONE HCL 10 MG PO TABS: 10.0000 mg | ORAL_TABLET | ORAL | 0 refills | Status: DC | PRN

## 2022-02-12 LAB — COLOGUARD: COLOGUARD: NEGATIVE

## 2022-02-12 NOTE — Progress Notes (Signed)
Normal cologuard, repeat in 3 years.

## 2022-02-19 ENCOUNTER — Ambulatory Visit (INDEPENDENT_AMBULATORY_CARE_PROVIDER_SITE_OTHER): Payer: Medicaid Other

## 2022-02-19 ENCOUNTER — Encounter: Payer: Self-pay | Admitting: Orthopedic Surgery

## 2022-02-19 ENCOUNTER — Ambulatory Visit: Payer: Self-pay

## 2022-02-19 ENCOUNTER — Ambulatory Visit (INDEPENDENT_AMBULATORY_CARE_PROVIDER_SITE_OTHER): Payer: Medicaid Other | Admitting: Orthopedic Surgery

## 2022-02-19 DIAGNOSIS — M25511 Pain in right shoulder: Secondary | ICD-10-CM

## 2022-02-19 DIAGNOSIS — G8929 Other chronic pain: Secondary | ICD-10-CM

## 2022-02-19 DIAGNOSIS — M25512 Pain in left shoulder: Secondary | ICD-10-CM

## 2022-02-19 DIAGNOSIS — M25552 Pain in left hip: Secondary | ICD-10-CM

## 2022-02-19 DIAGNOSIS — M25551 Pain in right hip: Secondary | ICD-10-CM | POA: Diagnosis not present

## 2022-02-19 NOTE — Progress Notes (Signed)
Office Visit Note   Patient: Gregory Bryant           Date of Birth: Jun 07, 1975           MRN: [id number] Visit Date: 02/19/2022 Requested by: Dorena Dew, FNP [address],  [place] [address] PCP: Dorena Dew, FNP  Subjective: Chief Complaint  Patient presents with   Right Shoulder - Pain   Left Shoulder - Pain    HPI: Gregory Bryant is a 47 y.o. male who presents to the office reporting bilateral shoulder pain.  Right is worse than left.  Diagnosed with avascular necrosis about 2 years ago.  Denies a history of injury.  He is right-hand dominant.  The pain wakes him from sleep at night.  No radiating pain and neck pain and no numbness and tingling.  Does take oxycodone regularly for pain associated with sickle cell disease.  He is not in pain management.  He also describes some left hip pain.  That will occasionally wake him from sleep.  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                ROS: All systems reviewed are negative as they relate to the chief complaint within the history of present illness.  Patient denies fevers or chills.  Assessment & Plan: Visit Diagnoses:  1. Chronic pain of both shoulders   2. Pain in right hip   3. 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  Currently his shoulders are more symptomatic.  I think he would be a good candidate for humeral head replacement when and if the time comes that his symptoms are not really manageable.  His hips are also progressing symptomatically but both hips and shoulders retain their sphericity without significant subchondral collapse.  We discussed both shoulder replacement and hip replacement and when his clinical symptoms worsen he will come back for further consultation.  Follow-up at that time.  Follow-Up Instructions: No follow-ups on file.   Orders:  Orders Placed This Encounter  Procedures   XR  Shoulder Right   XR Shoulder Left   XR HIP UNILAT W OR W/O PELVIS 2-3 VIEWS RIGHT   XR HIP UNILAT W OR W/O PELVIS 1V LEFT   No orders of the defined types were placed in this encounter.     Procedures: No procedures performed   Clinical Data: No additional findings.  Objective: Vital Signs: There were no vitals taken for this visit.  Physical Exam:  Constitutional: Patient appears well-developed HEENT:  Head: Normocephalic Eyes:EOM are normal Neck: Normal range of motion Cardiovascular: Normal rate Pulmonary/chest: Effort normal Neurologic: Patient is alert Skin: Skin is warm Psychiatric: Patient has normal mood and affect  Ortho Exam: Ortho exam demonstrates range of motion on the right of 80/100/170.  Range of motion on the left 60/110/170.  Rotator cuff strength is intact in strength supraspinatus and subscap muscle testing.  Minimal pain with passive range of motion of both hips.  Hip flexion abduction and adduction strength is intact.  No other masses lymphadenopathy or skin changes noted in the shoulder or hip region.  No coarse grinding or crepitus with passive range of motion of either shoulder at 90 degrees of abduction.  Specialty Comments:  No specialty comments available.  Imaging: XR Shoulder Left  Result Date: 02/19/2022 AP axillary outlet radiographs left shoulder reviewed.  No  acute fracture.  Avascular necrosis is present without subchondral collapse of the humeral head.  Acromiohumeral distance maintained.  Shoulder is located.  XR Shoulder Right  Result Date: 02/19/2022 AP axillary outlet radiographs right shoulder reviewed.  Avascular necrosis is present without subchondral collapse on the humeral head articular surface.  Acromiohumeral distance maintained.  No acute fracture.  Minimal AC joint arthritis.  XR HIP UNILAT W OR W/O PELVIS 1V LEFT  Result Date: 02/19/2022 AP pelvis lateral radiographs left hip reviewed.  Avascular necrosis is present  worse on the left compared to the right.  But 75% of the femoral head is involved on the left-hand side compared to 50% on the right.  No collapse of the subchondral bone visualized on plain radiographs.  No other acute fracture.  XR HIP UNILAT W OR W/O PELVIS 2-3 VIEWS RIGHT  Result Date: 02/19/2022 AP pelvis and lateral view bilateral hips reviewed.  Avascular necrosis is present with about 75% of the femoral head involved on the left-hand side and about 50% involved on the right.  Sphericity of the femoral head is maintained with no obvious collapse.  No acute fracture.    PMFS History: Patient Active Problem List   Diagnosis Date Noted   Need for immunization against influenza 01/15/2022   Screening for colon cancer 01/15/2022   Encounter for examination following treatment at hospital 01/15/2022   Vitamin D deficiency 10/28/2021   COVID 08/25/2020   Uncontrolled type 2 diabetes mellitus with hyperglycemia ([facility]) 08/15/2019   Constipation due to pain medication 07/14/2019   Sickle cell pain crisis ([facility]) 01/11/2018   Sickle cell anemia with pain ([facility]) 07/22/2017   Chronic musculoskeletal pain 03/30/2017   Avascular necrosis ([facility]) 03/30/2017   Asthma, mild intermittent 03/30/2017   Diabetes ([facility]) 02/07/2017   Functional asplenia 02/07/2017   Sickle cell disease, type [place] ([facility]) 02/07/2017   Past Medical History:  Diagnosis Date   Diabetes mellitus without complication (HCC)    Sickle cell anemia ([facility])     Family History  Problem Relation Age of Onset   Diabetes Mother     Past Surgical History:  Procedure Laterality Date   CHOLECYSTECTOMY     Social History   Occupational History   Not on file  Tobacco Use   Smoking status: Never   Smokeless tobacco: Never  Vaping Use   Vaping Use: Never used  Substance and Sexual Activity   Alcohol use: No   Drug use: No   Sexual activity: Not Currently

## 2022-02-23 NOTE — Progress Notes (Signed)
Established Patient Office Visit  Subjective   Patient ID: Gregory Bryant, male    DOB: [birth year]  Age: 47 y.o. MRN: [id number]    Gregory Bryant is a 47 year old male with a medical history significant for sickle cell disease, chronic pain syndrome, type 2 diabetes mellitus, hypertension, vitamin D deficiency, and hyperlipidemia presents for follow-up of chronic conditions.  Patient says that he has been doing well and is without complaint. Patient is not having any pain at this time.      Diabetes He presents for his follow-up diabetic visit. He has type 2 diabetes mellitus. His disease course has been stable. Pertinent negatives for hypoglycemia include no sweats. Pertinent negatives for diabetes include no blurred vision, no chest pain, no fatigue, no foot paresthesias, no polydipsia, no polyphagia, no polyuria, no visual change, no weakness and no weight loss. Symptoms are stable. Pertinent negatives for diabetic complications include no CVA or retinopathy. He does not see a podiatrist.Eye exam is current.  Hypertension This is a chronic problem. The problem is controlled. Pertinent negatives include no anxiety, blurred vision, chest pain, peripheral edema, PND, shortness of breath or sweats. Risk factors for coronary artery disease include obesity, sedentary lifestyle and diabetes mellitus. The current treatment provides no improvement. There are no compliance problems.  There is no history of kidney disease, CVA, heart failure, left ventricular hypertrophy or retinopathy. There is no history of chronic renal disease.    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[facility]) 07/22/2017   Chronic musculoskeletal pain 03/30/2017   Avascular necrosis ([facility]) 03/30/2017   Asthma, mild intermittent 03/30/2017   Diabetes ([facility]) 02/07/2017   Functional asplenia 02/07/2017   Sickle cell disease, type [place] ([facility]) 02/07/2017   Past Medical History:  Diagnosis Date   Diabetes mellitus without complication (HCC)    Sickle cell anemia (HCC)    Past Surgical History:  Procedure Laterality Date   CHOLECYSTECTOMY     Social History   Tobacco Use   Smoking status: Never   Smokeless tobacco: Never  Vaping Use   Vaping Use: Never used  Substance Use Topics   Alcohol use: No   Drug use: No   Social History   Socioeconomic History   Marital status: Married    Spouse name: Not on file   Number of children: Not on file   Years of education: Not on file   Highest education level: Not on file  Occupational History   Not on file  Tobacco Use   Smoking status: Never   Smokeless tobacco: Never  Vaping Use   Vaping Use: Never used  Substance and Sexual Activity   Alcohol use: No   Drug use: No   Sexual activity: Not Currently  Other Topics Concern   Not on file  Social History Narrative   Not on file   Social Determinants of Health   Financial Resource Strain: Not on file  Food Insecurity: Not on file  Transportation Needs: Not on file  Physical Activity: Not on file  Stress: Not on file  Social Connections: Not on  file  Intimate Partner Violence: Not on file   Family Status  Relation Name Status   Mother  Alive   Father  Deceased   Family History  Problem Relation Age of Onset   Diabetes Mother    Allergies  Allergen Reactions   Aspirin Other (See Comments)    unknown   Sulfa Antibiotics Other (See Comments)    unknown      Review of Systems  Constitutional: Negative.  Negative for fatigue and weight loss.  HENT: Negative.     Eyes:  Negative for blurred vision.  Respiratory: Negative.  Negative for shortness of breath.   Cardiovascular: Negative.  Negative for chest pain and PND.  Gastrointestinal: Negative.   Genitourinary: Negative.   Musculoskeletal:  Positive for joint pain.  Skin: Negative.   Neurological: Negative.  Negative for weakness.  Endo/Heme/Allergies: Negative.  Negative for polydipsia and polyphagia.  Psychiatric/Behavioral: Negative.        Objective:     BP 119/73   Pulse 89   Temp 97.8 F (36.6 C)   Ht '5\' 6"'$  (1.676 m)   Wt 160 lb 12.8 oz (72.9 kg)   SpO2 99%   BMI 25.95 kg/m  BP Readings from Last 3 Encounters:  02/04/22 119/73  01/15/22 138/73  11/05/21 (!) 157/94   Wt Readings from Last 3 Encounters:  02/04/22 160 lb 12.8 oz (72.9 kg)  01/15/22 163 lb 9.6 oz (74.2 kg)  11/05/21 166 lb (75.3 kg)      Physical Exam Constitutional:      Appearance: Normal appearance.  Eyes:     Pupils: Pupils are equal, round, and reactive to light.  Cardiovascular:     Rate and Rhythm: Normal rate and regular rhythm.     Pulses: Normal pulses.  Pulmonary:     Effort: Pulmonary effort is normal.  Abdominal:     General: Bowel sounds are normal.  Musculoskeletal:        General: Normal range of motion.  Skin:    General: Skin is warm.  Neurological:     General: No focal deficit present.     Mental Status: He is alert. Mental status is at baseline.  Psychiatric:        Mood and Affect: Mood normal.        Behavior: Behavior normal.        Thought Content: Thought content normal.        Judgment: Judgment normal.      Results for orders placed or performed in visit on 02/04/22  Microalbumin/Creatinine Ratio, Urine  Result Value Ref Range   Creatinine, Urine 67.3 Not Estab. mg/dL   Microalbumin, Urine 10.8 Not Estab. ug/mL   Microalb/Creat Ratio 16 0 - 29 mg/g creat  Fructosamine  Result Value Ref Range   Fructosamine 467 (H) 0 - 285 umol/L    Last CBC Lab  Results  Component Value Date   WBC 16.7 (H) 08/06/2021   HGB 13.1 08/06/2021   HCT 37.3 (L) 08/06/2021   MCV 86 08/06/2021   MCH 30.1 08/06/2021   RDW 15.9 (H) 08/06/2021   PLT 411 86/38/1771   Last metabolic panel Lab Results  Component Value Date   GLUCOSE 137 (H) 08/06/2021   NA 136 08/06/2021   K 4.6 08/06/2021   CL 100 08/06/2021   CO2 17 (L) 08/06/2021   BUN 9 08/06/2021   CREATININE 0.81 08/06/2021   EGFR 110 08/06/2021   CALCIUM 9.8 08/06/2021   PHOS 3.9  08/26/2020   PROT 7.8 08/06/2021   ALBUMIN 4.6 08/06/2021   LABGLOB 3.2 08/06/2021   AGRATIO 1.4 08/06/2021   BILITOT 3.0 (H) 08/06/2021   ALKPHOS 104 08/06/2021   AST 24 08/06/2021   ALT 17 08/06/2021   ANIONGAP 7 08/26/2020   Last lipids No results found for: "CHOL", "HDL", "LDLCALC", "LDLDIRECT", "TRIG", "CHOLHDL" Last hemoglobin A1c Lab Results  Component Value Date   HGBA1C 5.5 11/05/2021   Last thyroid functions No results found for: "TSH", "T3TOTAL", "T4TOTAL", "THYROIDAB" Last vitamin D Lab Results  Component Value Date   VD25OH 32.0 08/06/2021   Last vitamin B12 and Folate No results found for: "VITAMINB12", "FOLATE"    The ASCVD Risk score (Arnett DK, et al., 2019) failed to calculate for the following reasons:   Cannot find a previous HDL lab   Cannot find a previous total cholesterol lab    Assessment & Plan:   Problem List Items Addressed This Visit       Endocrine   Diabetes ([facility])   Relevant Orders   Microalbumin/Creatinine Ratio, Urine (Completed)   Fructosamine (Completed)     Other   Vitamin D deficiency   Sickle cell pain crisis ([facility])   Chronic musculoskeletal pain   Other Visit Diagnoses     Colon cancer screening    -  Primary   Essential hypertension         1. Sickle cell anemia with pain (HCC) Continue folic acid 1 mg daily Patient up-to-date with routine eye exam Follow-up with hematology as scheduled  2. Type 2 diabetes mellitus without complication,  without long-term current use of insulin (HCC)  - Microalbumin/Creatinine Ratio, Urine - Fructosamine  3. Essential hypertension BP 119/73   Pulse 89   Temp 97.8 F (36.6 C)   Ht '5\' 6"'$  (1.676 m)   Wt 160 lb 12.8 oz (72.9 kg)   SpO2 99%   BMI 25.95 kg/m  - Continue medication, monitor blood pressure at home. Continue DASH diet.  Reminder to go to the ER if any CP, SOB, nausea, dizziness, severe HA, changes vision/speech, left arm numbness and tingling and jaw pain.    4. Vitamin D deficiency Continue vitamin d repletion  5. Chronic musculoskeletal pain  Return in about 3 months (around 05/06/2022) for sickle cell anemia, diabetes.   Donia Pounds  APRN, MSN, FNP-C Patient [facility] [address] [place], [place] [address] [phone]

## 2022-04-19 IMAGING — CT CT ANGIO CHEST
2 of 6 series · 18 of 36 positions shown · IV contrast (omnipaque)
Comparison: None.

CLINICAL DATA: Back pain.

EXAM:
CT ANGIOGRAPHY CHEST WITH CONTRAST
TECHNIQUE: Multidetector CT imaging of the chest was performed using the
standard protocol during bolus administration of intravenous
contrast. Multiplanar CT image reconstructions and MIPs were
obtained to evaluate the vascular anatomy.
CONTRAST:  100mL OMNIPAQUE IOHEXOL 350 MG/ML SOLN

[Series 6: thins · axial · 0.65mm/px · z∈[+1337,+1578]mm · 17 of 273 slices shown]
[im 16/273  lung]
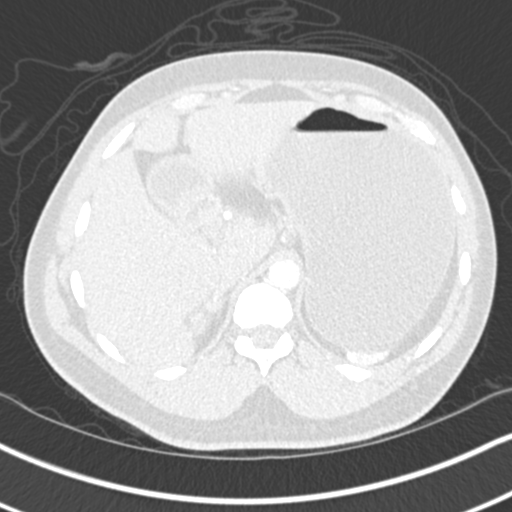
[im 31/273  mediastinal]
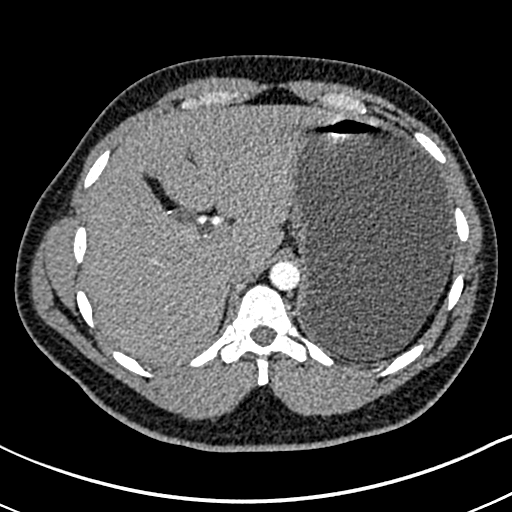
[im 46/273  lung]
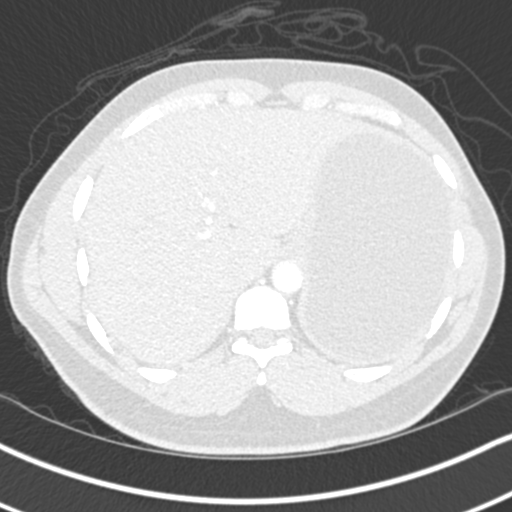
[im 61/273  mediastinal]
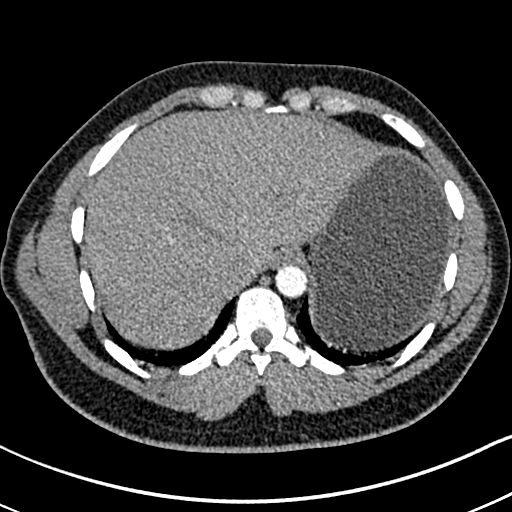
[im 76/273  lung]
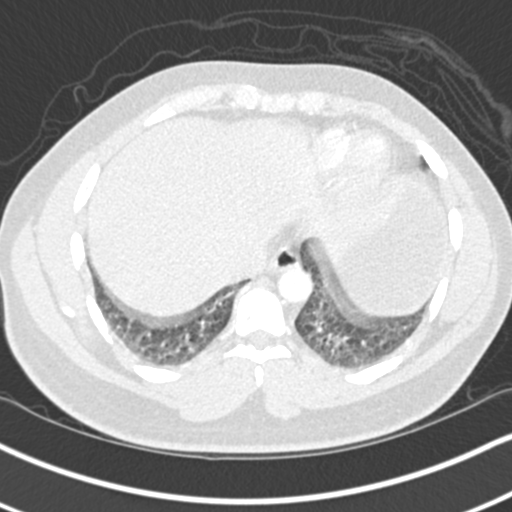
[im 91/273  mediastinal]
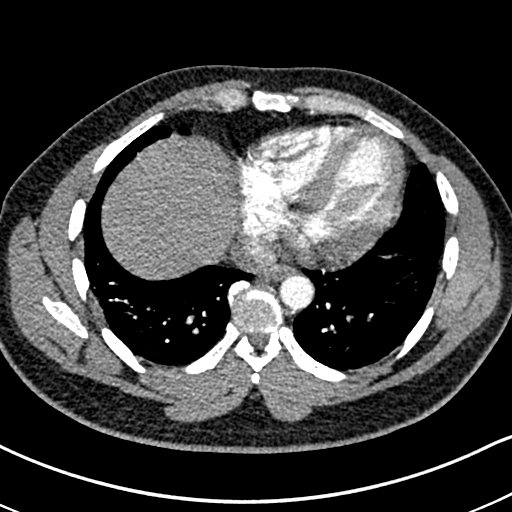
[im 106/273  lung]
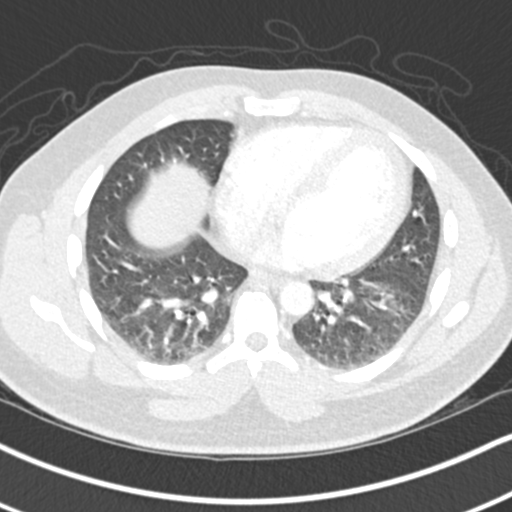
[im 121/273  mediastinal]
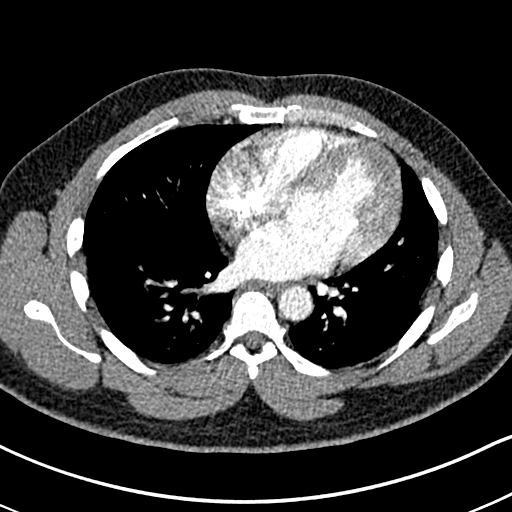
[im 137/273  lung]
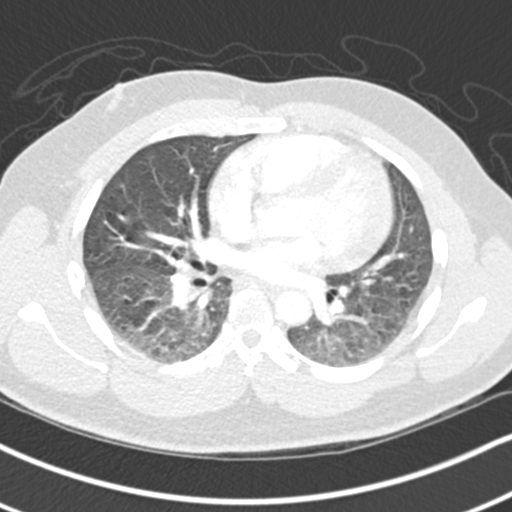
[im 152/273  mediastinal]
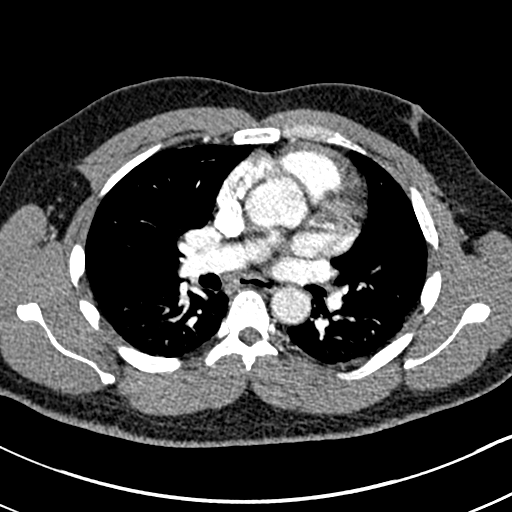
[im 167/273  lung]
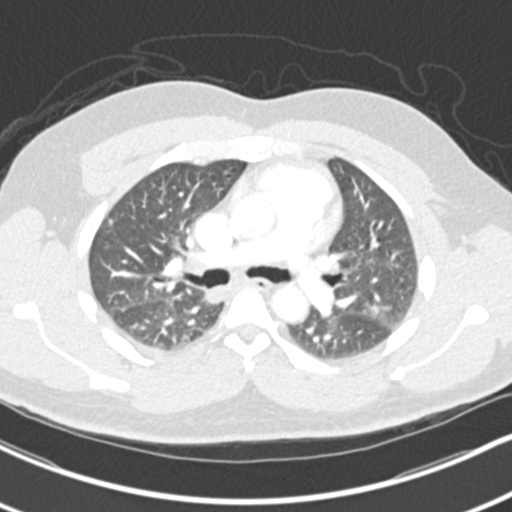
[im 182/273  mediastinal]
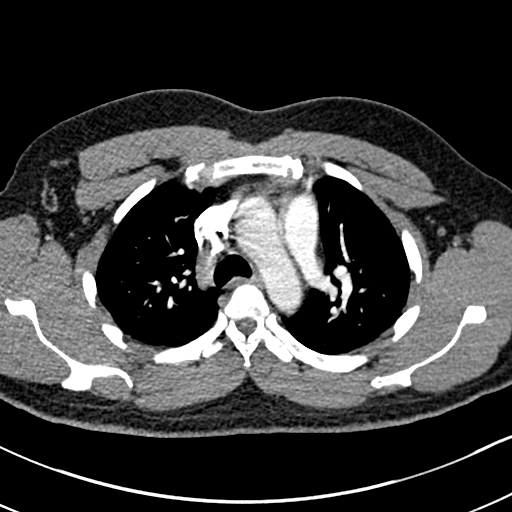
[im 197/273  lung]
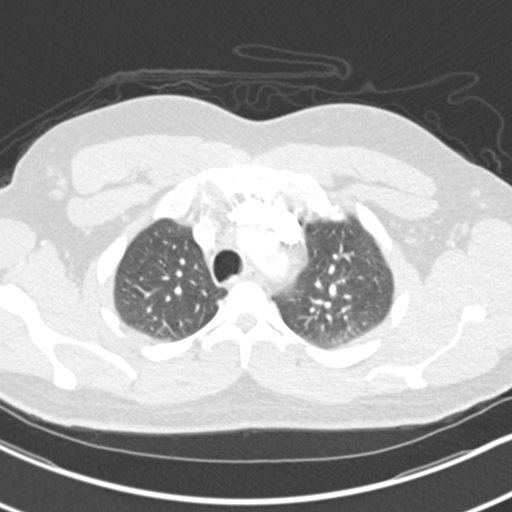
[im 212/273  mediastinal]
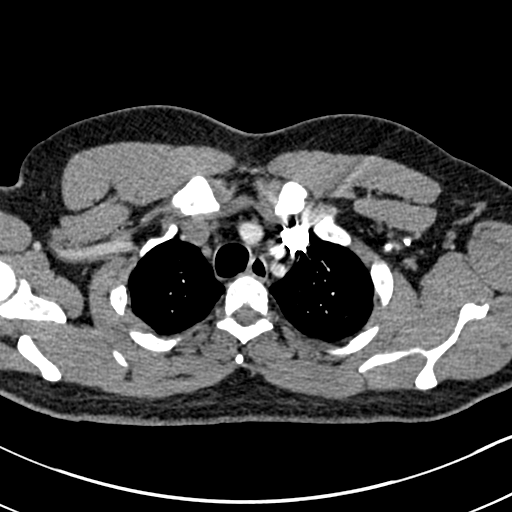
[im 227/273  lung]
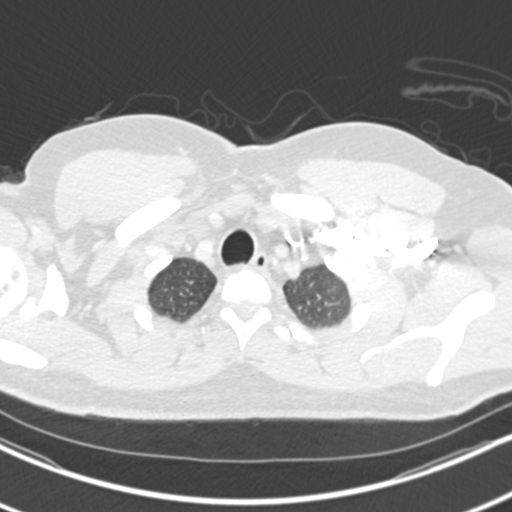
[im 242/273  mediastinal]
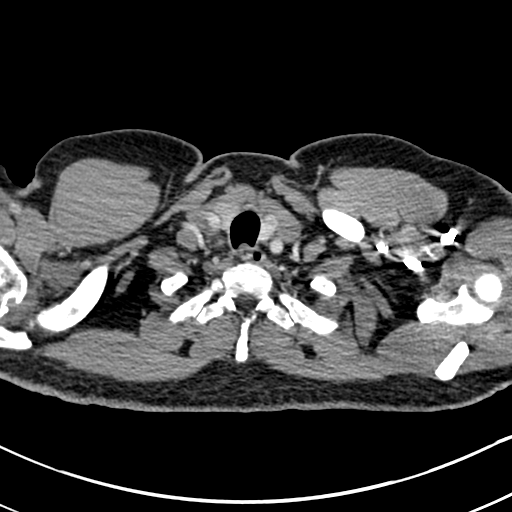
[im 257/273  lung]
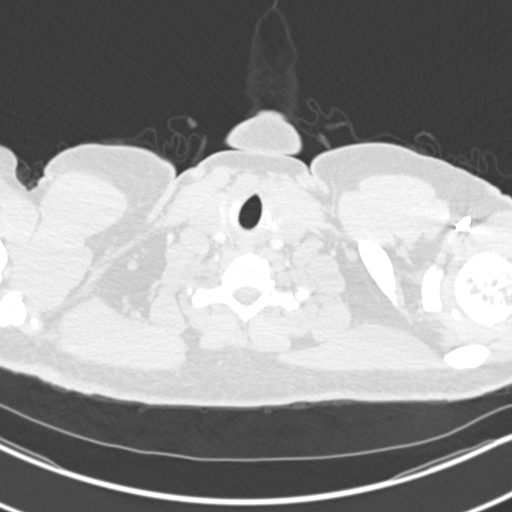

[Series 7: coronal mpr · coronal · 0.55mm/px · 1 of 140 slices shown]
[im 70/140  mediastinal]
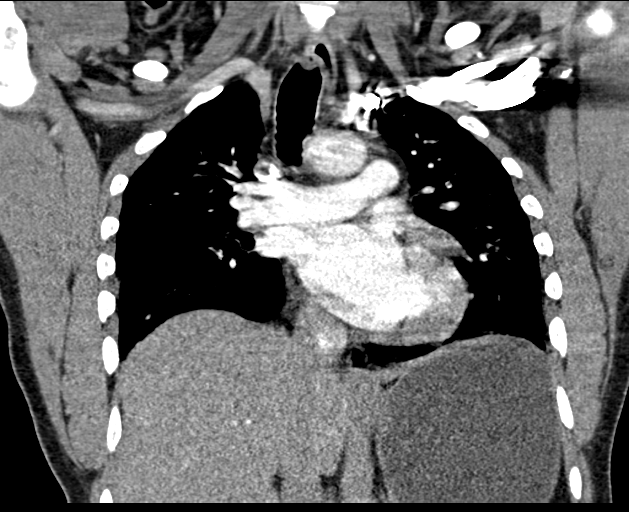

[18 of 36 positions shown; findings below may reference images not displayed]

FINDINGS: Cardiovascular: Satisfactory opacification of the pulmonary arteries
to the segmental level. No evidence of pulmonary embolism. Normal
heart size. No pericardial effusion.

Mediastinum/Nodes: No enlarged mediastinal, hilar, or axillary lymph
nodes. Thyroid gland, trachea, and esophagus demonstrate no
significant findings.

Lungs/Pleura: Lungs are clear. No pleural effusion or pneumothorax.

Upper Abdomen: Severe gastric distention is noted.

Musculoskeletal: Sclerotic densities are noted throughout the
visualized skeleton consistent with history of sickle cell disease.
No fracture is noted.

Review of the MIP images confirms the above findings.
IMPRESSION: 1. No definite evidence of pulmonary embolus.
2. Severe gastric distention is noted.
3. Sclerotic densities are noted throughout the visualized skeleton
consistent with history of sickle cell disease.

## 2022-05-13 ENCOUNTER — Encounter: Payer: Self-pay | Admitting: Family Medicine

## 2022-05-13 ENCOUNTER — Ambulatory Visit (INDEPENDENT_AMBULATORY_CARE_PROVIDER_SITE_OTHER): Payer: Medicaid Other | Admitting: Family Medicine

## 2022-05-13 VITALS — BP 116/78 | HR 87 | Temp 97.7°F | Ht 66.0 in | Wt 163.0 lb

## 2022-05-13 DIAGNOSIS — E559 Vitamin D deficiency, unspecified: Secondary | ICD-10-CM | POA: Diagnosis not present

## 2022-05-13 DIAGNOSIS — E119 Type 2 diabetes mellitus without complications: Secondary | ICD-10-CM

## 2022-05-13 DIAGNOSIS — I10 Essential (primary) hypertension: Secondary | ICD-10-CM

## 2022-05-13 DIAGNOSIS — D5700 Hb-SS disease with crisis, unspecified: Secondary | ICD-10-CM

## 2022-05-13 NOTE — Progress Notes (Deleted)
5.6  

## 2022-05-13 NOTE — Patient Instructions (Signed)
Diabetes Mellitus Action Plan Following a diabetes action plan is a way for you to manage your diabetes (diabetes mellitus) symptoms. The plan is color-coded to help you understand what actions you need to take based on any symptoms you are having. If you have symptoms in the red zone, you need medical care right away. If you have symptoms in the yellow zone, you are having problems. If you have symptoms in the green zone, you are doing well. Learning about and understanding diabetes can take time. Follow the plan that you develop with your health care provider. Know the target range for your blood sugar (glucose) level, and review your treatment plan with your health care provider at each visit. The target range for my blood sugar level is __________________________ mg/dL. Red zone Get medical help right away if you have any of the following symptoms: A blood sugar test result that is below 54 mg/dL (3 mmol/L). A blood sugar test result that is at or above 240 mg/dL (13.3 mmol/L) for 2 days in a row. Confusion or trouble thinking clearly. Difficulty breathing. Sickness or a fever for 2 or more days that is not getting better. Moderate or large ketone levels in your urine. Feeling tired or having no energy. If you have any red zone symptoms, do not wait to see if the symptoms will go away. Get medical help right away. Call your local emergency services (911 in the [place] Do not drive yourself to the hospital. If you have severely low blood sugar (severe hypoglycemia) and you cannot eat or drink, you may need glucagon. Make sure a family member or close friend knows how to check your blood sugar and how to give you glucagon. You may need to be treated in a hospital for this condition. Yellow zone If you have any of the following symptoms, your diabetes is not under control and you may need to make some changes: A blood sugar test result that is at or above 240 mg/dL (13.3 mmol/L) for 2 days in a  row. Blood sugar test results that are below 70 mg/dL (3.9 mmol/L). Other symptoms of hypoglycemia, such as: Shaking or feeling light-headed. Confusion or irritability. Feeling hungry. Having a fast heartbeat. If you have any yellow zone symptoms: Treat your hypoglycemia by eating or drinking 15 grams of a rapid-acting carbohydrate. Follow the 15:15 rule: Take 15 grams of a rapid-acting carbohydrate, such as: 1 tube of glucose gel. 4 glucose pills. 4 oz (120 mL) of fruit juice. 4 oz (120 mL) of regular (not diet) soda. Check your blood sugar 15 minutes after you take the carbohydrate. If the repeat blood sugar test is still at or below 70 mg/dL (3.9 mmol/L), take 15 grams of a carbohydrate again. If your blood sugar does not increase above 70 mg/dL (3.9 mmol/L) after 3 tries, get medical help right away. After your blood sugar returns to normal, eat a meal or a snack within 1 hour. Keep taking your daily medicines as told by your health care provider. Check your blood sugar more often than you normally would. Write down your results. Call your health care provider if you have trouble keeping your blood sugar in your target range.  Green zone These signs mean you are doing well and you can continue what you are doing to manage your diabetes: Your blood sugar is within your personal target range. For most people, a blood sugar level before a meal (preprandial) should be 80-130 mg/dL (4.4-7.2 mmol/L). You feel   well, and you are able to do daily activities. If you are in the green zone, continue to manage your diabetes as told by your health care provider. To do this: Eat a healthy diet. Exercise regularly. Check your blood sugar as told by your health care provider. Take your medicines as told by your health care provider.  Where to find more information American Diabetes Association (ADA): diabetes.org Association of Diabetes Care & Education Specialists (ADCES):  diabeteseducator.org Summary Following a diabetes action plan is a way for you to manage your diabetes symptoms. The plan is color-coded to help you understand what actions you need to take based on any symptoms you are having. Follow the plan that you develop with your health care provider. Make sure you know your personal target blood sugar level. Review your treatment plan with your health care provider at each visit. This information is not intended to replace advice given to you by your health care provider. Make sure you discuss any questions you have with your health care provider. Document Revised: 07/21/2019 Document Reviewed: 07/21/2019 Elsevier Patient Education  2023 Elsevier Inc.  

## 2022-05-14 ENCOUNTER — Encounter: Payer: Self-pay | Admitting: Family Medicine

## 2022-05-14 LAB — CBC WITH DIFFERENTIAL/PLATELET
Basophils Absolute: 0.2 10*3/uL (ref 0.0–0.2)
Basos: 1 %
EOS (ABSOLUTE): 0.9 10*3/uL — ABNORMAL HIGH (ref 0.0–0.4)
Eos: 6 %
Hematocrit: 39.7 % (ref 37.5–51.0)
Hemoglobin: 13.2 g/dL (ref 13.0–17.7)
Immature Grans (Abs): 0.2 10*3/uL — ABNORMAL HIGH (ref 0.0–0.1)
Immature Granulocytes: 1 %
Lymphocytes Absolute: 4.2 10*3/uL — ABNORMAL HIGH (ref 0.7–3.1)
Lymphs: 25 %
MCH: 28.4 pg (ref 26.6–33.0)
MCHC: 33.2 g/dL (ref 31.5–35.7)
MCV: 86 fL (ref 79–97)
Monocytes Absolute: 1.2 10*3/uL — ABNORMAL HIGH (ref 0.1–0.9)
Monocytes: 7 %
NRBC: 2 % — ABNORMAL HIGH (ref 0–0)
Neutrophils Absolute: 10.1 10*3/uL — ABNORMAL HIGH (ref 1.4–7.0)
Neutrophils: 60 %
Platelets: 444 10*3/uL (ref 150–450)
RBC: 4.64 x10E6/uL (ref 4.14–5.80)
RDW: 15.5 % — ABNORMAL HIGH (ref 11.6–15.4)
WBC: 16.7 10*3/uL — ABNORMAL HIGH (ref 3.4–10.8)

## 2022-05-14 LAB — FRUCTOSAMINE: Fructosamine: 594 umol/L — ABNORMAL HIGH (ref 0–285)

## 2022-05-15 ENCOUNTER — Other Ambulatory Visit: Payer: Self-pay | Admitting: Family Medicine

## 2022-05-15 DIAGNOSIS — E119 Type 2 diabetes mellitus without complications: Secondary | ICD-10-CM

## 2022-05-15 MED ORDER — LANCETS MISC. MISC
1.0000 | Freq: Three times a day (TID) | 0 refills | Status: AC
Start: 2022-05-15 — End: 2022-06-14

## 2022-05-15 MED ORDER — TRULICITY 0.75 MG/0.5ML ~~LOC~~ SOPN
0.7500 mg | PEN_INJECTOR | SUBCUTANEOUS | 2 refills | Status: AC
Start: 2022-05-15 — End: 2022-08-07

## 2022-05-15 MED ORDER — BLOOD GLUCOSE TEST VI STRP
1.0000 | ORAL_STRIP | Freq: Three times a day (TID) | 0 refills | Status: AC
Start: 2022-05-15 — End: 2022-06-14

## 2022-05-15 MED ORDER — BLOOD GLUCOSE MONITORING SUPPL DEVI
1.0000 | Freq: Two times a day (BID) | 0 refills | Status: AC
Start: 2022-05-15 — End: ?

## 2022-05-15 MED ORDER — LANCET DEVICE MISC
1.0000 | Freq: Three times a day (TID) | 0 refills | Status: AC
Start: 2022-05-15 — End: 2022-06-14

## 2022-05-15 NOTE — Progress Notes (Signed)
Orders Placed This Encounter  Procedures   Fructosamine    Standing Status:   Future    Standing Expiration Date:   05/15/2023   Basic Metabolic Panel (BMET)    Standing Status:   Future    Standing Expiration Date:   05/15/2023   Urinalysis, Routine w reflex microscopic    Standing Status:   Future    Standing Expiration Date:   05/15/2023     Nolon Nations  APRN, MSN, FNP-C Patient Care [facility] [address] [place], [place] [address] [phone]

## 2022-05-15 NOTE — Progress Notes (Signed)
Established Patient Office Visit  Subjective   Patient ID: Gregory Bryant, male    DOB: 1975-08-30  Age: 47 y.o. MRN: [id number]  Chief Complaint  Patient presents with   Sickle Cell Anemia   Diabetes    Gregory Bryant is a 47 year old  male with a medical history significant for sickle cell disease, chronic pain syndrome, hypertension, hyperlipidemia, and anemia of chronic disease that presents for a follow up of chronic conditions.   Patient has a history of uncontrolled diabetes mellitus. He says that he has been taking Glipizide and metformin consistently. He does not check blood glucose at home. He says that it has been difficult to follow a carbohydrate modified diet.  He states that he does not always know exactly what to eat.  Patient does not prepare his own meals.  Patient has not been to a diabetes educator for assistance with an eating plan.  Currently, he denies any polyuria, polydipsia, or polyphagia.  Patient checks feet regularly.  Patient does not exercise. History of hypertension.  Patient does not check blood pressure at home.  He has been taking all medications consistently.  He is not followed by cardiology at this time.  Patient denies any chest pain, shortness of breath, or lower extremity swelling. [name] also has a history of well-controlled sickle cell disease.  He is followed by [facility] hematology.  He has chronic pain that is primarily to his low back and lower extremities.  Patient also has some pain to bilateral shoulders.  He is under the care of an orthopedic specialist and states that he will warrant surgery to replace shoulders.  Patient says that pain is fairly well-controlled on oxycodone.  Diabetes He presents for his follow-up diabetic visit. He has type 2 diabetes mellitus. His disease course has been worsening. Pertinent negatives for diabetes include no blurred vision, no chest pain, no fatigue, no foot paresthesias, no foot  ulcerations, no polydipsia, no polyphagia, no polyuria, no visual change, no weakness and no weight loss. Symptoms are stable. He is following a high fat/cholesterol diet. He has not had a previous visit with a dietitian. He does not see a podiatrist.Eye exam is current.    Patient Active Problem List   Diagnosis Date Noted   Need for immunization against influenza 01/15/2022   Screening for colon cancer 01/15/2022   Encounter for examination following treatment at hospital 01/15/2022   Vitamin D deficiency 10/28/2021   COVID 08/25/2020   Uncontrolled type 2 diabetes mellitus with hyperglycemia 08/15/2019   Constipation due to pain medication 07/14/2019   Sickle cell pain crisis 01/11/2018   Sickle cell anemia with pain 07/22/2017   Chronic musculoskeletal pain 03/30/2017   Avascular necrosis 03/30/2017   Asthma, mild intermittent 03/30/2017   Diabetes 02/07/2017   Functional asplenia 02/07/2017   Sickle cell disease, type [place] 02/07/2017   Past Medical History:  Diagnosis Date   Diabetes mellitus without complication    Sickle cell anemia    Past Surgical History:  Procedure Laterality Date   CHOLECYSTECTOMY     Social History   Tobacco Use   Smoking status: Never   Smokeless tobacco: Never  Vaping Use   Vaping Use: Never used  Substance Use Topics   Alcohol use: No   Drug use: No   Social History   Socioeconomic History   Marital status: Married    Spouse name: Not on file   Number of children: Not on file  Years of education: Not on file   Highest education level: Not on file  Occupational History   Not on file  Tobacco Use   Smoking status: Never   Smokeless tobacco: Never  Vaping Use   Vaping Use: Never used  Substance and Sexual Activity   Alcohol use: No   Drug use: No   Sexual activity: Not Currently  Other Topics Concern   Not on file  Social History Narrative   ** Merged History Encounter **       Social Determinants of Health   Financial  Resource Strain: Not on file  Food Insecurity: Not on file  Transportation Needs: Not on file  Physical Activity: Not on file  Stress: Not on file  Social Connections: Not on file  Intimate Partner Violence: Not on file   Family Status  Relation Name Status   Mother  Alive   Father  Deceased   Family History  Problem Relation Age of Onset   Diabetes Mother    Allergies  Allergen Reactions   Aspirin Other (See Comments)    unknown   Sulfa Antibiotics Other (See Comments)    unknown      Review of Systems  Constitutional: Negative.  Negative for fatigue and weight loss.  HENT: Negative.    Eyes: Negative.  Negative for blurred vision.  Respiratory: Negative.    Cardiovascular: Negative.  Negative for chest pain.  Gastrointestinal: Negative.   Genitourinary: Negative.   Musculoskeletal:  Positive for back pain and joint pain.  Skin: Negative.   Neurological: Negative.  Negative for weakness.  Endo/Heme/Allergies: Negative.  Negative for polydipsia and polyphagia.      Objective:     BP 116/78   Pulse 87   Temp 97.7 F (36.5 C)   Ht  (1.676 m)   Wt 163 lb (73.9 kg)   SpO2 96%   BMI 26.31 kg/m  BP Readings from Last 3 Encounters:  05/13/22 116/78  02/04/22 119/73  01/15/22 138/73   Wt Readings from Last 3 Encounters:  05/13/22 163 lb (73.9 kg)  02/04/22 160 lb 12.8 oz (72.9 kg)  01/15/22 163 lb 9.6 oz (74.2 kg)      Physical Exam Constitutional:      Appearance: Normal appearance.  HENT:     Mouth/Throat:     Mouth: Mucous membranes are moist.  Cardiovascular:     Rate and Rhythm: Normal rate and regular rhythm.  Pulmonary:     Effort: Pulmonary effort is normal.     Breath sounds: Normal breath sounds.  Abdominal:     General: Bowel sounds are normal.  Musculoskeletal:        General: Normal range of motion.  Skin:    General: Skin is warm.  Neurological:     General: No focal deficit present.     Mental Status: He is alert. Mental  status is at baseline.  Psychiatric:        Mood and Affect: Mood normal.        Behavior: Behavior normal.        Thought Content: Thought content normal.        Judgment: Judgment normal.      Results for orders placed or performed in visit on 05/13/22  Fructosamine  Result Value Ref Range   Fructosamine 594 (H) 0 - 285 umol/L  CBC with Differential  Result Value Ref Range   WBC 16.7 (H) 3.4 - 10.8 x10E3/uL   RBC 4.64 4.14 -  5.80 x10E6/uL   Hemoglobin 13.2 13.0 - 17.7 g/dL   Hematocrit 16.1 09.6 - 51.0 %   MCV 86 79 - 97 fL   MCH 28.4 26.6 - 33.0 pg   MCHC 33.2 31.5 - 35.7 g/dL   RDW 04.5 (H) 40.9 - 81.1 %   Platelets 444 150 - 450 x10E3/uL   Neutrophils 60 Not Estab. %   Lymphs 25 Not Estab. %   Monocytes 7 Not Estab. %   Eos 6 Not Estab. %   Basos 1 Not Estab. %   Neutrophils Absolute 10.1 (H) 1.4 - 7.0 x10E3/uL   Lymphocytes Absolute 4.2 (H) 0.7 - 3.1 x10E3/uL   Monocytes Absolute 1.2 (H) 0.1 - 0.9 x10E3/uL   EOS (ABSOLUTE) 0.9 (H) 0.0 - 0.4 x10E3/uL   Basophils Absolute 0.2 0.0 - 0.2 x10E3/uL   Immature Granulocytes 1 Not Estab. %   Immature Grans (Abs) 0.2 (H) 0.0 - 0.1 x10E3/uL   NRBC 2 (H) 0 - 0 %    Last CBC Lab Results  Component Value Date   WBC 16.7 (H) 05/13/2022   HGB 13.2 05/13/2022   HCT 39.7 05/13/2022   MCV 86 05/13/2022   MCH 28.4 05/13/2022   RDW 15.5 (H) 05/13/2022   PLT 444 05/13/2022   Last metabolic panel Lab Results  Component Value Date   GLUCOSE 137 (H) 08/06/2021   NA 136 08/06/2021   K 4.6 08/06/2021   CL 100 08/06/2021   CO2 17 (L) 08/06/2021   BUN 9 08/06/2021   CREATININE 0.81 08/06/2021   EGFR 110 08/06/2021   CALCIUM 9.8 08/06/2021   PHOS 3.9 08/26/2020   PROT 7.8 08/06/2021   ALBUMIN 4.6 08/06/2021   LABGLOB 3.2 08/06/2021   AGRATIO 1.4 08/06/2021   BILITOT 3.0 (H) 08/06/2021   ALKPHOS 104 08/06/2021   AST 24 08/06/2021   ALT 17 08/06/2021   ANIONGAP 7 08/26/2020   Last lipids No results found for: "CHOL",  "HDL", "LDLCALC", "LDLDIRECT", "TRIG", "CHOLHDL" Last hemoglobin A1c Lab Results  Component Value Date   HGBA1C 5.5 11/05/2021   Last thyroid functions No results found for: "TSH", "T3TOTAL", "T4TOTAL", "THYROIDAB" Last vitamin D Lab Results  Component Value Date   VD25OH 32.0 08/06/2021   Last vitamin B12 and Folate No results found for: "VITAMINB12", "FOLATE"    The ASCVD Risk score (Arnett DK, et al., 2019) failed to calculate for the following reasons:   Cannot find a previous HDL lab   Cannot find a previous total cholesterol lab    Assessment & Plan:   Problem List Items Addressed This Visit       Endocrine   Diabetes   Relevant Medications   Dulaglutide (TRULICITY) 0.75 MG/0.5ML SOPN   Other Relevant Orders   Fructosamine (Completed)     Other   Vitamin D deficiency   Sickle cell anemia with pain - Primary   Relevant Orders   CBC with Differential (Completed)   Other Visit Diagnoses     Essential hypertension         1. Sickle cell anemia with pain Mr. Mathers has well-controlled sickle cell disease.  Reviewed previous laboratory values, consistent with his baseline.  Will repeat CBC with differential on today. Patient to continue to follow-up with his hematology team. - CBC with Differential  2. Type 2 diabetes mellitus without complication, without long-term current use of insulin Patient's diabetes has not been very well-controlled over the past year.  He says that he has been  taking his medications consistently.  Patient is not ready to start insulin at this time.  We will start a trial of Trulicity 0.75 mg weekly.  Discussed at length how to take this medication.  Patient will return in 1 month to repeat fructosamine.  Also, will send a referral to diabetes education for assistance with meal planning.  Will also send prescription for glucose monitoring for a.m. and p.m.  Patient to bring glucometer to follow-up appointment. - Fructosamine -  Dulaglutide (TRULICITY) 0.75 MG/0.5ML SOPN; Inject 0.75 mg into the skin once a week.  Dispense: 2 mL; Refill: 2 - Blood Glucose Monitoring Suppl DEVI; 1 each by Does not apply route 2 (two) times daily. May substitute to any manufacturer covered by patient's insurance.  Dispense: 1 each; Refill: 0 - Glucose Blood (BLOOD GLUCOSE TEST STRIPS) STRP; 1 each by In Vitro route in the morning, at noon, and at bedtime. May substitute to any manufacturer covered by patient's insurance.  Dispense: 100 strip; Refill: 0 - Lancet Device MISC; 1 each by Does not apply route in the morning, at noon, and at bedtime. May substitute to any manufacturer covered by patient's insurance.  Dispense: 1 each; Refill: 0 - Lancets Misc. MISC; 1 each by Does not apply route in the morning, at noon, and at bedtime. May substitute to any manufacturer covered by patient's insurance.  Dispense: 100 each; Refill: 0 - Ambulatory referral to diabetic education   3. Essential hypertension BP 116/78   Pulse 87   Temp 97.7 F (36.5 C)   Ht 5\' 6"  (1.676 m)   Wt 163 lb (73.9 kg)   SpO2 96%   BMI 26.31 kg/m     4. Vitamin D deficiency       Mr. Soltau will follow-up in 1 month for medication management.  Nolon Nations  APRN, MSN, FNP-C [facility] [address] [place], [place] [address] [phone]

## 2022-05-16 ENCOUNTER — Other Ambulatory Visit: Payer: Self-pay | Admitting: Family Medicine

## 2022-05-16 DIAGNOSIS — I10 Essential (primary) hypertension: Secondary | ICD-10-CM

## 2022-06-03 ENCOUNTER — Telehealth: Payer: Self-pay | Admitting: Family Medicine

## 2022-06-03 ENCOUNTER — Other Ambulatory Visit: Payer: Self-pay | Admitting: Family Medicine

## 2022-06-03 DIAGNOSIS — M7918 Myalgia, other site: Secondary | ICD-10-CM

## 2022-06-03 MED ORDER — OXYCODONE HCL 10 MG PO TABS
10.0000 mg | ORAL_TABLET | ORAL | 0 refills | Status: DC | PRN
Start: 2022-06-03 — End: 2022-07-31

## 2022-06-03 NOTE — Telephone Encounter (Signed)
Caller & Relationship to patient:  MRN #  [id number]   Call Back Number:   Date of Last Office Visit: 05/16/2022     Date of Next Office Visit: 06/13/2022    Medication(s) to be Refilled: Oxycodone  Preferred Pharmacy:   ** Please notify patient to allow 48-72 hours to process** **Let patient know to contact pharmacy at the end of the day to make sure medication is ready. ** **If patient has not been seen in a year or longer, book an appointment **Advise to use MyChart for refill requests OR to contact their pharmacy

## 2022-06-03 NOTE — Progress Notes (Signed)
Reviewed PDMP substance reporting system prior to prescribing opiate medications. No inconsistencies noted.    Meds ordered this encounter  Medications  . Oxycodone HCl 10 MG TABS    Sig: Take 1 tablet (10 mg total) by mouth every 4 (four) hours as needed.    Dispense:  60 tablet    Refill:  0    Order Specific Question:   Supervising Provider    Answer:   JEGEDE, OLUGBEMIGA E [[id number]]     Gregory Quiocho Moore Johnchristopher Sarvis  APRN, MSN, FNP-C [facility] [address]  [place], [place] [address] [phone]  

## 2022-06-13 ENCOUNTER — Other Ambulatory Visit: Payer: Medicaid Other

## 2022-06-16 ENCOUNTER — Other Ambulatory Visit: Payer: Self-pay

## 2022-06-22 ENCOUNTER — Emergency Department (HOSPITAL_COMMUNITY): Payer: Medicaid Other

## 2022-06-22 ENCOUNTER — Emergency Department (HOSPITAL_COMMUNITY)
Admission: EM | Admit: 2022-06-22 | Discharge: 2022-06-22 | Disposition: A | Discharge status: Home / Self Care | Payer: Medicaid Other | Attending: Emergency Medicine | Admitting: Emergency Medicine

## 2022-06-22 ENCOUNTER — Encounter (HOSPITAL_COMMUNITY): Payer: Self-pay

## 2022-06-22 ENCOUNTER — Other Ambulatory Visit: Payer: Self-pay

## 2022-06-22 DIAGNOSIS — M79672 Pain in left foot: Secondary | ICD-10-CM | POA: Diagnosis present

## 2022-06-22 DIAGNOSIS — M722 Plantar fascial fibromatosis: Secondary | ICD-10-CM | POA: Diagnosis not present

## 2022-06-22 DIAGNOSIS — E119 Type 2 diabetes mellitus without complications: Secondary | ICD-10-CM | POA: Insufficient documentation

## 2022-06-22 MED ORDER — KETOROLAC TROMETHAMINE 60 MG/2ML IM SOLN
30.0000 mg | Freq: Once | INTRAMUSCULAR | Status: AC
  Administered 2022-06-22: 30 mg via INTRAMUSCULAR
  Filled 2022-06-22: qty 2

## 2022-06-22 NOTE — ED Notes (Signed)
Pt is ambulatory into ED lobby and to registration.

## 2022-06-22 NOTE — ED Triage Notes (Signed)
Pt arrives c/o L foot pain since Thursday. Denies injury. States that he is on his feet a lot. No meds today.

## 2022-06-22 NOTE — ED Provider Notes (Signed)
[place] EMERGENCY DEPARTMENT AT [facility] Provider Note   CSN: [id number] Arrival date & time: 06/22/22  1943     History Chief Complaint  Patient presents with   Foot Pain    Akheem Schmitzer is a 47 y.o. male with history of type 2 diabetes and sickle cell anemia presents emerged from today for evaluation of left foot pain.  Patient reports that on Tuesday he started noticing pain in the bottom of his left foot whenever he got out of bed and started walking.  He reports that he is on his feet a lot and did continue to have some pain.  Reports he feels a cramping type sensation the bottom of his foot.  Denies any numbness or tingling.  Tried Tylenol once without much relief.  Denies any wounds.  Reports sugars are under control. Denies any injury to the foot or leg.  Allergic to aspirin and sulfa.  Denies any allergies to any Motrin or ibuprofen.  Denies any tobacco, EtOH illicit drug use.   Foot Pain       Home Medications Prior to Admission medications   Medication Sig Start Date End Date Taking? Authorizing Provider  Blood Glucose Monitoring Suppl DEVI 1 each by Does not apply route 2 (two) times daily. May substitute to any manufacturer covered by patient's insurance. 05/15/22   Massie Maroon, FNP  Dulaglutide (TRULICITY) 0.75 MG/0.5ML SOPN Inject 0.75 mg into the skin once a week. 05/15/22 08/07/22  Massie Maroon, FNP  folic acid (FOLVITE) 1 MG tablet Take 1 tablet (1 mg total) by mouth daily. 11/05/21   Massie Maroon, FNP  glipiZIDE (GLUCOTROL) 10 MG tablet Take 1 tablet (10 mg total) by mouth daily before breakfast. 11/05/21   Massie Maroon, FNP  ibuprofen (ADVIL) 600 MG tablet Take 1 tablet (600 mg total) by mouth every 8 (eight) hours as needed. 08/06/21   Massie Maroon, FNP  lisinopril (ZESTRIL) 5 MG tablet Take 1 tablet (5 mg total) by mouth daily. 05/16/22   Massie Maroon, FNP  metFORMIN (GLUCOPHAGE) 1000 MG tablet Take 1  tablet (1,000 mg total) by mouth 2 (two) times daily with a meal. 11/05/21 05/13/22  Massie Maroon, FNP  ondansetron (ZOFRAN ODT) 8 MG disintegrating tablet Take 1 tablet (8 mg total) by mouth every 8 (eight) hours as needed for nausea or vomiting. 01/10/20   Massie Maroon, FNP  Oxycodone HCl 10 MG TABS Take 1 tablet (10 mg total) by mouth every 4 (four) hours as needed. 06/03/22   Massie Maroon, FNP  Vitamin D, Ergocalciferol, (DRISDOL) 1.25 MG (50000 UNIT) CAPS capsule Take 1 capsule (50,000 Units total) by mouth every 7 (seven) days. Mondays Patient taking differently: Take 50,000 Units by mouth every 30 (thirty) days. Mondays 01/29/21   Massie Maroon, FNP  cetirizine (ZYRTEC) 10 MG tablet Take 1 tablet (10 mg total) by mouth daily. Patient not taking: Reported on 11/30/2019 07/08/18 11/30/19  Mike Gip, FNP  fluticasone [facility]) 50 MCG/ACT nasal spray Place 2 sprays into both nostrils daily. Patient not taking: Reported on 11/30/2019 07/08/18 11/30/19  Mike Gip, FNP      Allergies    Aspirin and Sulfa antibiotics    Review of Systems   Review of Systems  Constitutional:  Negative for chills and fever.  Musculoskeletal:  Positive for myalgias.  Neurological:  Negative for weakness and numbness.    Physical Exam Updated Vital Signs BP (!) 155/88  Pulse 64   Temp 98.6 F (37 C) (Oral)   Resp 16   Ht 5\' 6"  (1.676 m)   Wt 74.8 kg   SpO2 96%   BMI 26.63 kg/m  Physical Exam Vitals and nursing note reviewed.  Constitutional:      General: He is not in acute distress.    Appearance: Normal appearance. He is not toxic-appearing.  Eyes:     General: No scleral icterus. Pulmonary:     Effort: Pulmonary effort is normal. No respiratory distress.  Musculoskeletal:        General: Tenderness present.       Feet:  Feet:     Comments: Tenderness to the marked area. Palpable DP and PT pulses bilaterally. Sensation reportedly intact. Cap refill brisk. I do not  appreciate any wounds noted to the foot or in between the toes. The patient is able to dorsal and plantar flex the foot with some pain. Compartments are soft.  Skin:    General: Skin is dry.  Neurological:     General: No focal deficit present.     Mental Status: He is alert. Mental status is at baseline.  Psychiatric:        Mood and Affect: Mood normal.     ED Results / Procedures / Treatments   Labs (all labs ordered are listed, but only abnormal results are displayed) Labs Reviewed - No data to display  EKG None  Radiology DG Foot Complete Left  Result Date: 06/22/2022 CLINICAL DATA:  Plantar foot pain. EXAM: LEFT FOOT - COMPLETE 3+ VIEW COMPARISON:  None Available. FINDINGS: There is no evidence of fracture or dislocation. Trace peripheral spurring of the first metatarsal phalangeal joint. No erosive change or periostitis. No plantar calcaneal spur or Achilles tendon enthesophyte. Soft tissues are unremarkable. 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 No specific findings for plantar foot pain. Electronically Signed   By: Narda Rutherford M.D.   On: 06/22/2022 21:55   Image read did not cross over, however this is the copied results.   CLINICAL DATA: Plantar foot pain.  EXAM: LEFT FOOT - COMPLETE 3+ VIEW  COMPARISON: None Available.  FINDINGS: There is no evidence of fracture or dislocation. Trace peripheral spurring of the first metatarsal phalangeal joint. No erosive change or periostitis. No plantar calcaneal spur or Achilles tendon enthesophyte. Soft tissues are unremarkable.  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 No specific findings for plantar foot pain.   Electronically Signed By: Narda Rutherford M.D. On: 06/22/2022 21:55   Procedures Procedures   Medications Ordered in ED Medications  ketorolac (TORADOL) injection 30 mg (has no administration in time range)    ED Course/ Medical  Decision Making/ A&P                            Medical Decision Making Amount and/or Complexity of Data Reviewed Radiology: ordered.   47 y.o. male presents to the ER today for evaluation of left plantar foot pain. Differential diagnosis includes but is not limited to muscle spasm, cellulitis, occult fracture, sickle cell pain, plantar fasciitis. Vital signs show mildly elevated blood pressure otherwise unremarkable. Physical exam as noted above.   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 No specific findings for plantar foot pain.  Patient reports he does not feel like his sickle cell pain.  Reports is much less.  He reports it is more irritating given that  he does been out long time on his feet.  Likely more consistent with plantar fasciitis given that it is worse in the morning.  Feels tight.  He is neurovascular intact with soft compartments and full range of motion. Will recommend ibuprofen and massage with a lacrosse ball or tennis ball to help with the muscle pain.   We discussed plan at bedside. We discussed strict return precautions and red flag symptoms. The patient verbalized their understanding and agrees to the plan. The patient is stable and being discharged home in good condition.  Portions of this report may have been transcribed using voice recognition software. Every effort was made to ensure accuracy; however, inadvertent computerized transcription errors may be present.   Final Clinical Impression(s) / ED Diagnoses Final diagnoses:  Plantar fasciitis    Rx / DC Orders ED Discharge Orders     None         Achille Rich, PA-C 06/22/22 2258    Loetta Rough, MD 06/23/22 1945

## 2022-06-22 NOTE — Discharge Instructions (Signed)
You were seen in the ER today for evaluation of your foot pain.  I likely think this is plantar fasciitis.  Including formation on this and rehab for this into the discharge paperwork.  Please review.  I recommend taking 6 mg of ibuprofen every 6 hours as needed for pain.  You can also try gentle stretching as well.  Make sure you are drinking plenty of fluids and staying well-hydrated.  I given you follow-up with the podiatrist.  Please call to schedule an appointment. If you have any concerns with new or worsening symptoms, please return to the nearest emergency department for evaluation.  Contact a health care provider if you have: Symptoms that do not go away with home treatment. Pain that gets worse. Pain that affects your ability to move or do daily activities.

## 2022-06-24 ENCOUNTER — Telehealth: Payer: Self-pay

## 2022-06-24 NOTE — Transitions of Care (Post Inpatient/ED Visit) (Signed)
   06/24/2022  Name: Gregory Bryant MRN: [id number] DOB: 1975/04/08  Today's TOC FU Call Status: Today's TOC FU Call Status:: Unsuccessul Call (1st Attempt) Unsuccessful Call (1st Attempt) Date: 06/24/22  Attempted to reach the patient regarding the most recent Inpatient/ED visit.  Follow Up Plan: No further outreach attempts will be made at this time. We have been unable to contact the patient. No follow up call due to this being a sickle cell pt.  Signature Renelda Loma RMA

## 2022-07-03 ENCOUNTER — Ambulatory Visit (INDEPENDENT_AMBULATORY_CARE_PROVIDER_SITE_OTHER): Payer: Medicaid Other | Admitting: Podiatry

## 2022-07-03 ENCOUNTER — Ambulatory Visit (INDEPENDENT_AMBULATORY_CARE_PROVIDER_SITE_OTHER): Payer: Medicaid Other

## 2022-07-03 ENCOUNTER — Encounter: Payer: Self-pay | Admitting: Podiatry

## 2022-07-03 ENCOUNTER — Telehealth: Payer: Self-pay | Admitting: Podiatry

## 2022-07-03 DIAGNOSIS — M7672 Peroneal tendinitis, left leg: Secondary | ICD-10-CM | POA: Diagnosis not present

## 2022-07-03 DIAGNOSIS — M722 Plantar fascial fibromatosis: Secondary | ICD-10-CM

## 2022-07-03 DIAGNOSIS — M79672 Pain in left foot: Secondary | ICD-10-CM | POA: Diagnosis not present

## 2022-07-03 MED ORDER — DICLOFENAC SODIUM 75 MG PO TBEC: 75.0000 mg | DELAYED_RELEASE_TABLET | Freq: Two times a day (BID) | ORAL | 2 refills | Status: DC

## 2022-07-03 MED ORDER — TRIAMCINOLONE ACETONIDE 10 MG/ML IJ SUSP
10.0000 mg | Freq: Once | INTRAMUSCULAR | Status: AC
Start: 2022-07-03 — End: 2022-07-03
  Administered 2022-07-03: 10 mg

## 2022-07-03 NOTE — Progress Notes (Signed)
Subjective:   Patient ID: Gregory Bryant, male   DOB: 47 y.o.   MRN: [id number]   HPI Patient presents with a lot of pain in the outside of his left foot and also has had problems with his heel.  States both areas can be bothersome but the outside of the foot is now which worse.  Patient has good digital perfusion is well oriented x 3 and does not smoke likes to be active   Review of Systems  All other systems reviewed and are negative.       Objective:  Physical Exam Vitals and nursing note reviewed.  Constitutional:      Appearance: He is well-developed.  Pulmonary:     Effort: Pulmonary effort is normal.  Musculoskeletal:        General: Normal range of motion.  Skin:    General: Skin is warm.  Neurological:     Mental Status: He is alert.     Neurovascular status intact muscle strength adequate range of motion adequate exquisite discomfort with pain in the lateral side left foot base of fifth metatarsal peroneal insertion moderate discomfort plantar heel lateral side     Assessment:  Appears to be acute peroneal tendinitis with moderate plantar fascial inflammation     Plan:  H&P conditions reviewed and I went ahead today I did sterile prep and after explaining the risk I did inject carefully the peroneal distal tendon sheath as it inserts base of fifth metatarsal and advised on reduced activity.  Reappoint as symptoms indicate  X-rays indicate no signs of bony pathology appears to be soft tissue

## 2022-07-03 NOTE — Telephone Encounter (Signed)
Pharmacy called to state that the Diclofenac Rx that was sent in requires a prior authorization.  They stated they faxed it over already.  Just an [place]

## 2022-07-09 ENCOUNTER — Telehealth: Payer: Self-pay

## 2022-07-09 NOTE — Transitions of Care (Post Inpatient/ED Visit) (Cosign Needed)
07/09/2022  Name: Gregory Bryant MRN: [id number] DOB: 11-11-1975  Today's TOC FU Call Status: Today's TOC FU Call Status:: Successful TOC FU Call Competed TOC FU Call Complete Date: 07/09/22  Transition Care Management Follow-up Telephone Call Date of Discharge: 07/08/22 Discharge Facility: Other ([facility]) Name of Other ([facility]) Discharge Facility: [facility] Type of Discharge: Emergency Department Reason for ED Visit: Other: (cut finger) How have you been since you were released from the hospital?: Better Any questions or concerns?: No  Items Reviewed: Did you receive and understand the discharge instructions provided?: Yes Medications obtained,verified, and reconciled?: Yes (Medications Reviewed) Any new allergies since your discharge?: No Dietary orders reviewed?: NA Do you have support at home?: Yes People in Home: other relative(s), friend(s)  Medications Reviewed Today: Medications Reviewed Today     Reviewed by Renelda Loma, RMA (Registered Medical Assistant) on 07/09/22 at 1240  Med List Status: <None>   Medication Order Taking? Sig Documenting Provider Last Dose Status Informant  Blood Glucose Monitoring Suppl DEVI [id number] Yes 1 each by Does not apply route 2 (two) times daily. May substitute to any manufacturer covered by patient's insurance. Massie Maroon, FNP Taking Active    Patient not taking:   Discontinued 11/30/19 1446 diclofenac (VOLTAREN) 75 MG EC tablet [id number] Yes Take 1 tablet (75 mg total) by mouth 2 (two) times daily. Lenn Sink, DPM Taking Active   Dulaglutide (TRULICITY) 0.75 MG/0.5ML SOPN [id number] Yes Inject 0.75 mg into the skin once a week. Massie Maroon, FNP Taking Active    Patient not taking:   Discontinued 11/30/19 1446 folic acid (FOLVITE) 1 MG tablet [id number] Yes Take 1 tablet (1 mg total) by mouth daily. Massie Maroon, FNP Taking Active   glipiZIDE (GLUCOTROL) 10 MG tablet [id number] Yes Take 1  tablet (10 mg total) by mouth daily before breakfast. Massie Maroon, FNP Taking Active   ibuprofen (ADVIL) 600 MG tablet [id number] Yes Take 1 tablet (600 mg total) by mouth every 8 (eight) hours as needed. Massie Maroon, FNP Taking Active            Med Note Sherilyn Cooter, [facility]   Wed Jan 15, 2022  3:16 PM) Prn   lisinopril (ZESTRIL) 5 MG tablet [id number] Yes Take 1 tablet (5 mg total) by mouth daily. Massie Maroon, FNP Taking Active   metFORMIN (GLUCOPHAGE) 1000 MG tablet [id number] Yes Take 1 tablet (1,000 mg total) by mouth 2 (two) times daily with a meal. Massie Maroon, FNP Taking Active   ondansetron (ZOFRAN ODT) 8 MG disintegrating tablet [id number] Yes Take 1 tablet (8 mg total) by mouth every 8 (eight) hours as needed for nausea or vomiting. Massie Maroon, FNP Taking Active Self           Med Note Renelda Loma   Wed Jan 15, 2022  3:16 PM) Prn   Oxycodone HCl 10 MG TABS [id number] Yes Take 1 tablet (10 mg total) by mouth every 4 (four) hours as needed. Massie Maroon, FNP Taking Active   Vitamin D, Ergocalciferol, (DRISDOL) 1.25 MG (50000 UNIT) CAPS capsule [id number] Yes Take 1 capsule (50,000 Units total) by mouth every 7 (seven) days. Mondays  Patient taking differently: Take 50,000 Units by mouth every 30 (thirty) days. Mondays   Massie Maroon, FNP Taking Active             Home Care and Equipment/Supplies: Were Home Health Services Ordered?: NA Any new equipment or medical supplies ordered?:  NA  Functional Questionnaire: Do you need assistance with bathing/showering or dressing?: No Do you need assistance with meal preparation?: No Do you need assistance with eating?: No Do you have difficulty maintaining continence: No Do you need assistance with getting out of bed/getting out of a chair/moving?: No Do you have difficulty managing or taking your medications?: No  Follow up appointments reviewed: PCP Follow-up appointment confirmed?: Yes Date of  PCP follow-up appointment?: 07/11/22 Follow-up Provider: [name] Specialist [facility] Follow-up appointment confirmed?: NA Do you need transportation to your follow-up appointment?: No Do you understand care options if your condition(s) worsen?: Yes-patient verbalized understanding  SDOH Interventions Today    Flowsheet Row Most Recent Value  SDOH Interventions   Food Insecurity Interventions Other (Comment)  [name] advise [name].]  Housing Interventions Intervention Not Indicated  Transportation Interventions Intervention Not Indicated  Utilities Interventions Intervention Not Indicated  Alcohol Usage Interventions Intervention Not Indicated (Score <7)  Financial Strain Interventions Other (Comment)  [will advise [name]]  Physical Activity Interventions Intervention Not Indicated  Stress Interventions Other (Comment)  [will address at appt 07/11/22]  Social Connections Interventions Intervention Not Indicated       SIGNATURE Renelda Loma RMA

## 2022-07-11 ENCOUNTER — Encounter: Payer: Self-pay | Admitting: Nurse Practitioner

## 2022-07-11 ENCOUNTER — Ambulatory Visit (INDEPENDENT_AMBULATORY_CARE_PROVIDER_SITE_OTHER): Payer: Medicaid Other | Admitting: Nurse Practitioner

## 2022-07-11 VITALS — BP 135/69 | HR 70 | Temp 97.2°F | Wt 162.0 lb

## 2022-07-11 DIAGNOSIS — S61215A Laceration without foreign body of left ring finger without damage to nail, initial encounter: Secondary | ICD-10-CM | POA: Insufficient documentation

## 2022-07-11 DIAGNOSIS — S61215D Laceration without foreign body of left ring finger without damage to nail, subsequent encounter: Secondary | ICD-10-CM | POA: Diagnosis not present

## 2022-07-11 MED ORDER — MUPIROCIN 2 % EX OINT
1.0000 | TOPICAL_OINTMENT | Freq: Two times a day (BID) | CUTANEOUS | 0 refills | Status: AC
Start: 2022-07-11 — End: ?

## 2022-07-11 NOTE — Progress Notes (Signed)
@Patient  ID: Gregory Bryant, male    DOB: 10-Nov-1975, 47 y.o.   MRN: [id number]  Chief Complaint  Patient presents with   Hospitalization Follow-up    Cut on finger left hand ring finger    Referring provider: Massie Maroon, FNP   HPI  Patient presents today for an ED follow-up.  Patient was seen in the ED on 07/07/2022 through [facility] for a laceration to ring finger to left hand.  This occurred a few days prior to this visit.  Patient accidentally cut his finger with a chainsaw.  Patient was treated with doxycycline.  He is still currently on this medication.  Laceration appears to be well-healing.  He does still have slight swelling to that finger.  We will order Bactroban ointment. Denies f/c/s, n/v/d, hemoptysis, PND, leg swelling Denies chest pain or edema      Allergies  Allergen Reactions   Aspirin Other (See Comments)    unknown   Sulfa Antibiotics Other (See Comments)    unknown    Immunization History  Administered Date(s) Administered   Influenza Split 10/12/2017   Influenza,inj,Quad PF,6+ Mos 10/12/2017, 10/08/2018, 10/18/2020, 01/15/2022   Influenza,inj,Quad PF,6-35 Mos 04/10/2017, 10/13/2019   Influenza-Unspecified 10/12/2017   Meningococcal B Recombinant 07/14/2019   Meningococcal Conjugate 10/12/2017, 10/12/2017   Pneumococcal Conjugate-13 04/14/2019   Pneumococcal Polysaccharide-23 10/12/2017   Tdap 07/11/2016, 10/12/2017    Past Medical History:  Diagnosis Date   Diabetes mellitus without complication (HCC)    Sickle cell anemia (HCC)     Tobacco History: Social History   Tobacco Use  Smoking Status Never  Smokeless Tobacco Never   Counseling given: Not Answered   Outpatient Encounter Medications as of 07/11/2022  Medication Sig   Blood Glucose Monitoring Suppl DEVI 1 each by Does not apply route 2 (two) times daily. May substitute to any manufacturer covered by patient's insurance.   folic acid (FOLVITE) 1 MG  tablet Take 1 tablet (1 mg total) by mouth daily.   glipiZIDE (GLUCOTROL) 10 MG tablet Take 1 tablet (10 mg total) by mouth daily before breakfast.   ibuprofen (ADVIL) 600 MG tablet Take 1 tablet (600 mg total) by mouth every 8 (eight) hours as needed.   lisinopril (ZESTRIL) 5 MG tablet Take 1 tablet (5 mg total) by mouth daily.   metFORMIN (GLUCOPHAGE) 1000 MG tablet Take 1 tablet (1,000 mg total) by mouth 2 (two) times daily with a meal.   mupirocin ointment (BACTROBAN) 2 % Apply 1 Application topically 2 (two) times daily.   ondansetron (ZOFRAN ODT) 8 MG disintegrating tablet Take 1 tablet (8 mg total) by mouth every 8 (eight) hours as needed for nausea or vomiting.   Oxycodone HCl 10 MG TABS Take 1 tablet (10 mg total) by mouth every 4 (four) hours as needed.   Vitamin D, Ergocalciferol, (DRISDOL) 1.25 MG (50000 UNIT) CAPS capsule Take 1 capsule (50,000 Units total) by mouth every 7 (seven) days. Mondays (Patient taking differently: Take 50,000 Units by mouth every 30 (thirty) days. Mondays)   diclofenac (VOLTAREN) 75 MG EC tablet Take 1 tablet (75 mg total) by mouth 2 (two) times daily. (Patient not taking: Reported on 07/11/2022)   Dulaglutide (TRULICITY) 0.75 MG/0.5ML SOPN Inject 0.75 mg into the skin once a week. (Patient not taking: Reported on 07/11/2022)   [DISCONTINUED] cetirizine (ZYRTEC) 10 MG tablet Take 1 tablet (10 mg total) by mouth daily. (Patient not taking: Reported on 11/30/2019)   [DISCONTINUED] fluticasone (FLONASE) 50 MCG/ACT nasal  spray Place 2 sprays into both nostrils daily. (Patient not taking: Reported on 11/30/2019)   No facility-administered encounter medications on file as of 07/11/2022.     Review of Systems  Review of Systems  Constitutional: Negative.   HENT: Negative.    Cardiovascular: Negative.   Gastrointestinal: Negative.   Skin:  Positive for wound.  Allergic/Immunologic: Negative.   Neurological: Negative.   Psychiatric/Behavioral: Negative.          Physical Exam  BP 135/69   Pulse 70   Temp (!) 97.2 F (36.2 C)   Wt 162 lb (73.5 kg)   SpO2 98%   BMI 26.15 kg/m   Wt Readings from Last 5 Encounters:  07/11/22 162 lb (73.5 kg)  06/22/22 165 lb (74.8 kg)  05/13/22 163 lb (73.9 kg)  02/04/22 160 lb 12.8 oz (72.9 kg)  01/15/22 163 lb 9.6 oz (74.2 kg)     Physical Exam Vitals and nursing note reviewed.  Constitutional:      General: He is not in acute distress.    Appearance: He is well-developed.  Cardiovascular:     Rate and Rhythm: Normal rate and regular rhythm.  Pulmonary:     Effort: Pulmonary effort is normal.     Breath sounds: Normal breath sounds.  Skin:    General: Skin is warm and dry.     Findings: Laceration (left ring finger, area appears well healing. no drainage. slight swelling still noted.) present.  Neurological:     Mental Status: He is alert and oriented to person, place, and time.      Lab Results:  CBC    Component Value Date/Time   WBC 16.7 (H) 05/13/2022 0919   WBC 12.7 (H) 08/26/2020 0453   RBC 4.64 05/13/2022 0919   RBC 4.34 08/26/2020 0453   HGB 13.2 05/13/2022 0919   HCT 39.7 05/13/2022 0919   PLT 444 05/13/2022 0919   MCV 86 05/13/2022 0919   MCH 28.4 05/13/2022 0919   MCH 29.0 08/26/2020 0453   MCHC 33.2 05/13/2022 0919   MCHC 36.0 08/26/2020 0453   RDW 15.5 (H) 05/13/2022 0919   LYMPHSABS 4.2 (H) 05/13/2022 0919   MONOABS 1.0 08/26/2020 0453   EOSABS 0.9 (H) 05/13/2022 0919   BASOSABS 0.2 05/13/2022 0919    BMET    Component Value Date/Time   NA 136 08/06/2021 1218   K 4.6 08/06/2021 1218   CL 100 08/06/2021 1218   CO2 17 (L) 08/06/2021 1218   GLUCOSE 137 (H) 08/06/2021 1218   GLUCOSE 151 (H) 08/26/2020 0453   BUN 9 08/06/2021 1218   CREATININE 0.81 08/06/2021 1218   CALCIUM 9.8 08/06/2021 1218   GFRNONAA >60 08/26/2020 0453   GFRAA 121 01/10/2020 1311    BNP No results found for: "BNP"  ProBNP No results found for: "PROBNP"  Imaging: DG Foot  Complete Left  Result Date: 06/22/2022 CLINICAL DATA:  Plantar foot pain. EXAM: LEFT FOOT - COMPLETE 3+ VIEW COMPARISON:  None Available. FINDINGS: There is no evidence of fracture or dislocation. Trace peripheral spurring of the first metatarsal phalangeal joint. No erosive change or periostitis. No plantar calcaneal spur or Achilles tendon enthesophyte. Soft tissues are unremarkable. 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 No specific findings for plantar foot pain. Electronically Signed   By: Narda Rutherford M.D.   On: 06/22/2022 21:55     Assessment & Plan:   Laceration of left ring finger without foreign body without damage to nail -  mupirocin ointment (BACTROBAN) 2 %; Apply 1 Application topically 2 (two) times daily.  Dispense: 22 g; Refill: 0  Follow up:  Follow up as scheduled     Ivonne Andrew, NP 07/11/2022

## 2022-07-11 NOTE — Assessment & Plan Note (Signed)
-   mupirocin ointment (BACTROBAN) 2 %; Apply 1 Application topically 2 (two) times daily.  Dispense: 22 g; Refill: 0  Follow up:  Follow up as scheduled

## 2022-07-11 NOTE — Patient Instructions (Signed)
1. Laceration of left ring finger without foreign body without damage to nail, subsequent encounter  - mupirocin ointment (BACTROBAN) 2 %; Apply 1 Application topically 2 (two) times daily.  Dispense: 22 g; Refill: 0  Follow up:  Follow up as scheduled

## 2022-07-25 ENCOUNTER — Other Ambulatory Visit: Payer: Self-pay | Admitting: Podiatry

## 2022-07-25 DIAGNOSIS — M79672 Pain in left foot: Secondary | ICD-10-CM

## 2022-07-25 DIAGNOSIS — M7672 Peroneal tendinitis, left leg: Secondary | ICD-10-CM

## 2022-07-25 DIAGNOSIS — M722 Plantar fascial fibromatosis: Secondary | ICD-10-CM

## 2022-07-29 ENCOUNTER — Inpatient Hospital Stay (HOSPITAL_COMMUNITY)
Admission: EM | Admit: 2022-07-29 | Discharge: 2022-07-31 | DRG: 812 | Disposition: A | Discharge status: Home / Self Care | Payer: Medicaid Other | Attending: Internal Medicine | Admitting: Internal Medicine

## 2022-07-29 ENCOUNTER — Other Ambulatory Visit: Payer: Self-pay

## 2022-07-29 ENCOUNTER — Encounter (HOSPITAL_COMMUNITY): Payer: Self-pay

## 2022-07-29 ENCOUNTER — Emergency Department (HOSPITAL_COMMUNITY): Payer: Medicaid Other

## 2022-07-29 DIAGNOSIS — E119 Type 2 diabetes mellitus without complications: Secondary | ICD-10-CM

## 2022-07-29 DIAGNOSIS — G894 Chronic pain syndrome: Secondary | ICD-10-CM | POA: Diagnosis present

## 2022-07-29 DIAGNOSIS — Z79899 Other long term (current) drug therapy: Secondary | ICD-10-CM

## 2022-07-29 DIAGNOSIS — D638 Anemia in other chronic diseases classified elsewhere: Secondary | ICD-10-CM | POA: Diagnosis present

## 2022-07-29 DIAGNOSIS — L299 Pruritus, unspecified: Secondary | ICD-10-CM | POA: Diagnosis present

## 2022-07-29 DIAGNOSIS — D5700 Hb-SS disease with crisis, unspecified: Principal | ICD-10-CM | POA: Diagnosis present

## 2022-07-29 DIAGNOSIS — R000 Tachycardia, unspecified: Secondary | ICD-10-CM | POA: Diagnosis present

## 2022-07-29 DIAGNOSIS — E871 Hypo-osmolality and hyponatremia: Secondary | ICD-10-CM | POA: Diagnosis not present

## 2022-07-29 DIAGNOSIS — Z886 Allergy status to analgesic agent status: Secondary | ICD-10-CM

## 2022-07-29 DIAGNOSIS — Z862 Personal history of diseases of the blood and blood-forming organs and certain disorders involving the immune mechanism: Secondary | ICD-10-CM | POA: Diagnosis not present

## 2022-07-29 DIAGNOSIS — Z1152 Encounter for screening for COVID-19: Secondary | ICD-10-CM

## 2022-07-29 DIAGNOSIS — I10 Essential (primary) hypertension: Secondary | ICD-10-CM | POA: Diagnosis present

## 2022-07-29 DIAGNOSIS — Z7984 Long term (current) use of oral hypoglycemic drugs: Secondary | ICD-10-CM

## 2022-07-29 DIAGNOSIS — Z833 Family history of diabetes mellitus: Secondary | ICD-10-CM

## 2022-07-29 DIAGNOSIS — Z882 Allergy status to sulfonamides status: Secondary | ICD-10-CM

## 2022-07-29 DIAGNOSIS — D72829 Elevated white blood cell count, unspecified: Secondary | ICD-10-CM | POA: Diagnosis present

## 2022-07-29 DIAGNOSIS — E860 Dehydration: Secondary | ICD-10-CM | POA: Diagnosis present

## 2022-07-29 DIAGNOSIS — G8929 Other chronic pain: Secondary | ICD-10-CM

## 2022-07-29 LAB — COMPREHENSIVE METABOLIC PANEL
ALT: 29 U/L (ref 0–44)
AST: 39 U/L (ref 15–41)
Albumin: 5 g/dL (ref 3.5–5.0)
Alkaline Phosphatase: 103 U/L (ref 38–126)
Anion gap: 10 (ref 5–15)
BUN: 12 mg/dL (ref 6–20)
CO2: 19 mmol/L — ABNORMAL LOW (ref 22–32)
Calcium: 9.8 mg/dL (ref 8.9–10.3)
Chloride: 102 mmol/L (ref 98–111)
Creatinine, Ser: 0.84 mg/dL (ref 0.61–1.24)
GFR, Estimated: >60 mL/min (ref >60)
Glucose, Bld: 159 mg/dL — ABNORMAL HIGH (ref 70–99)
Potassium: 3.5 mmol/L (ref 3.5–5.1)
Sodium: 131 mmol/L — ABNORMAL LOW (ref 135–145)
Total Bilirubin: 4.6 mg/dL — ABNORMAL HIGH (ref 0.3–1.2)
Total Protein: 9.6 g/dL — ABNORMAL HIGH (ref 6.5–8.1)

## 2022-07-29 LAB — RETICULOCYTES
Immature Retic Fract: 37.2 % — ABNORMAL HIGH (ref 2.3–15.9)
RBC.: 4.53 MIL/uL (ref 4.22–5.81)
Retic Count, Absolute: 280.5 10*3/uL — ABNORMAL HIGH (ref 19.0–186.0)
Retic Ct Pct: 6.7 % — ABNORMAL HIGH (ref 0.4–3.1)

## 2022-07-29 LAB — SARS CORONAVIRUS 2 BY RT PCR: SARS Coronavirus 2 by RT PCR: NEGATIVE

## 2022-07-29 LAB — CBC WITH DIFFERENTIAL/PLATELET
Abs Immature Granulocytes: 1.41 10*3/uL — ABNORMAL HIGH (ref 0.00–0.07)
Basophils Absolute: 0.3 10*3/uL — ABNORMAL HIGH (ref 0.0–0.1)
Basophils Relative: 1 %
Eosinophils Absolute: 0.3 10*3/uL (ref 0.0–0.5)
Eosinophils Relative: 1 %
HCT: 36.8 % — ABNORMAL LOW (ref 39.0–52.0)
Hemoglobin: 13.2 g/dL (ref 13.0–17.0)
Immature Granulocytes: 5 %
Lymphocytes Relative: 11 %
Lymphs Abs: 3 10*3/uL (ref 0.7–4.0)
MCH: 29.3 pg (ref 26.0–34.0)
MCHC: 35.9 g/dL (ref 30.0–36.0)
MCV: 81.6 fL (ref 80.0–100.0)
Monocytes Absolute: 2 10*3/uL — ABNORMAL HIGH (ref 0.1–1.0)
Monocytes Relative: 7 %
Neutro Abs: 21.3 10*3/uL — ABNORMAL HIGH (ref 1.7–7.7)
Neutrophils Relative %: 75 %
Platelets: 387 10*3/uL (ref 150–400)
RBC: 4.51 MIL/uL (ref 4.22–5.81)
RDW: 15.1 % (ref 11.5–15.5)
WBC: 28.3 10*3/uL — ABNORMAL HIGH (ref 4.0–10.5)
nRBC: 2 % — ABNORMAL HIGH (ref 0.0–0.2)

## 2022-07-29 LAB — TROPONIN I (HIGH SENSITIVITY): Troponin I (High Sensitivity): 16 ng/L (ref <18)

## 2022-07-29 LAB — GLUCOSE, CAPILLARY: Glucose-Capillary: 222 mg/dL — ABNORMAL HIGH (ref 70–99)

## 2022-07-29 MED ORDER — OXYCODONE HCL 5 MG PO TABS
10.0000 mg | ORAL_TABLET | Freq: Once | ORAL | Status: AC
  Administered 2022-07-29: 10 mg via ORAL
  Filled 2022-07-29: qty 2

## 2022-07-29 MED ORDER — HYDROMORPHONE 1 MG/ML IV SOLN
INTRAVENOUS | Status: DC
  Filled 2022-07-29: qty 30

## 2022-07-29 MED ORDER — SODIUM CHLORIDE 0.45 % IV SOLN: INTRAVENOUS | Status: DC

## 2022-07-29 MED ORDER — HYDROMORPHONE 1 MG/ML IV SOLN
INTRAVENOUS | Status: DC
  Administered 2022-07-29: 30 mg via INTRAVENOUS
  Administered 2022-07-30 (×2): 0.5 mg via INTRAVENOUS
  Administered 2022-07-30 (×2): 1 mg via INTRAVENOUS
  Administered 2022-07-30: 0.1 mg via INTRAVENOUS
  Administered 2022-07-30: 0.5 mg via INTRAVENOUS
  Administered 2022-07-31: 0.01 mg via INTRAVENOUS

## 2022-07-29 MED ORDER — DIPHENHYDRAMINE HCL 25 MG PO CAPS: 25.0000 mg | ORAL_CAPSULE | ORAL | Status: DC | PRN

## 2022-07-29 MED ORDER — DOCUSATE SODIUM 100 MG PO CAPS
100.0000 mg | ORAL_CAPSULE | Freq: Two times a day (BID) | ORAL | Status: DC
  Administered 2022-07-30 – 2022-07-31 (×2): 100 mg via ORAL
  Filled 2022-07-29 (×3): qty 1

## 2022-07-29 MED ORDER — SODIUM CHLORIDE 0.9% FLUSH: 9.0000 mL | INTRAVENOUS | Status: DC | PRN

## 2022-07-29 MED ORDER — INSULIN ASPART 100 UNIT/ML IJ SOLN
0.0000 [IU] | Freq: Three times a day (TID) | INTRAMUSCULAR | Status: DC
  Administered 2022-07-30: 3 [IU] via SUBCUTANEOUS
  Administered 2022-07-30 (×2): 2 [IU] via SUBCUTANEOUS
  Administered 2022-07-31: 3 [IU] via SUBCUTANEOUS
  Administered 2022-07-31: 2 [IU] via SUBCUTANEOUS

## 2022-07-29 MED ORDER — MELATONIN 3 MG PO TABS: 3.0000 mg | ORAL_TABLET | Freq: Every evening | ORAL | Status: DC | PRN

## 2022-07-29 MED ORDER — HYDROMORPHONE HCL 1 MG/ML IJ SOLN
0.5000 mg | INTRAMUSCULAR | Status: AC
  Administered 2022-07-29: 0.5 mg via SUBCUTANEOUS

## 2022-07-29 MED ORDER — HYDROMORPHONE HCL 1 MG/ML IJ SOLN: 1.0000 mg | INTRAMUSCULAR | Status: DC | PRN

## 2022-07-29 MED ORDER — POLYETHYLENE GLYCOL 3350 17 G PO PACK: 17.0000 g | PACK | Freq: Every day | ORAL | Status: DC | PRN

## 2022-07-29 MED ORDER — ACETAMINOPHEN 325 MG PO TABS: 650.0000 mg | ORAL_TABLET | Freq: Four times a day (QID) | ORAL | Status: DC | PRN

## 2022-07-29 MED ORDER — IOHEXOL 350 MG/ML SOLN
80.0000 mL | Freq: Once | INTRAVENOUS | Status: AC | PRN
  Administered 2022-07-29: 80 mL via INTRAVENOUS

## 2022-07-29 MED ORDER — NALOXONE HCL 0.4 MG/ML IJ SOLN: 0.4000 mg | INTRAMUSCULAR | Status: DC | PRN

## 2022-07-29 MED ORDER — LISINOPRIL 5 MG PO TABS
5.0000 mg | ORAL_TABLET | Freq: Every day | ORAL | Status: DC
  Administered 2022-07-30 – 2022-07-31 (×2): 5 mg via ORAL
  Filled 2022-07-29 (×2): qty 1

## 2022-07-29 MED ORDER — HYDROMORPHONE HCL 1 MG/ML IJ SOLN
0.5000 mg | INTRAMUSCULAR | Status: DC
  Filled 2022-07-29: qty 1

## 2022-07-29 MED ORDER — HYDROMORPHONE HCL 1 MG/ML IJ SOLN
0.5000 mg | INTRAMUSCULAR | Status: AC | PRN
  Administered 2022-07-29 (×2): 0.5 mg via SUBCUTANEOUS
  Filled 2022-07-29 (×2): qty 1

## 2022-07-29 MED ORDER — ACETAMINOPHEN 650 MG RE SUPP: 650.0000 mg | Freq: Four times a day (QID) | RECTAL | Status: DC | PRN

## 2022-07-29 MED ORDER — ONDANSETRON HCL 4 MG/2ML IJ SOLN: 4.0000 mg | Freq: Four times a day (QID) | INTRAMUSCULAR | Status: DC | PRN

## 2022-07-29 NOTE — ED Provider Notes (Signed)
[facility] Provider Note   CSN: [id number] Arrival date & time: 07/29/22  1630     History  Chief Complaint  Patient presents with   Sickle Cell Pain Crisis    Gregory Bryant is a 47 y.o. male with past medical history of sickle cell anemia who presents to the ED complaining of sickle cell pain.  He states that he is having pain in both of his arms, shoulders/back, both of his legs, and intermittently in his chest.  States that this started yesterday.  He has been taking his home medications including oxycodone 10 mg but has had persistent pain.  No recent nausea, vomiting, diarrhea. States chest pain did improve with first dose of Dilaudid ordered from triage but pain in arms has remained. No recent fever, cough, congestion. States with pain today he has felt mildly short of breath. Typically has well controlled pain. No history of acute chest syndrome or clots. States symptoms typical for his chest pain.       Home Medications Prior to Admission medications   Medication Sig Start Date End Date Taking? Authorizing Provider  Blood Glucose Monitoring Suppl DEVI 1 each by Does not apply route 2 (two) times daily. May substitute to any manufacturer covered by patient's insurance. 05/15/22   Massie Maroon, FNP  diclofenac (VOLTAREN) 75 MG EC tablet Take 1 tablet (75 mg total) by mouth 2 (two) times daily. Patient not taking: Reported on 07/11/2022 07/03/22   Lenn Sink, DPM  Dulaglutide (TRULICITY) 0.75 MG/0.5ML SOPN Inject 0.75 mg into the skin once a week. Patient not taking: Reported on 07/11/2022 05/15/22 08/07/22  Massie Maroon, FNP  folic acid (FOLVITE) 1 MG tablet Take 1 tablet (1 mg total) by mouth daily. 11/05/21   Massie Maroon, FNP  glipiZIDE (GLUCOTROL) 10 MG tablet Take 1 tablet (10 mg total) by mouth daily before breakfast. 11/05/21   Massie Maroon, FNP  ibuprofen (ADVIL) 600 MG tablet Take 1 tablet  (600 mg total) by mouth every 8 (eight) hours as needed. 08/06/21   Massie Maroon, FNP  lisinopril (ZESTRIL) 5 MG tablet Take 1 tablet (5 mg total) by mouth daily. 05/16/22   Massie Maroon, FNP  metFORMIN (GLUCOPHAGE) 1000 MG tablet Take 1 tablet (1,000 mg total) by mouth 2 (two) times daily with a meal. 11/05/21 07/11/22  Massie Maroon, FNP  mupirocin ointment (BACTROBAN) 2 % Apply 1 Application topically 2 (two) times daily. 07/11/22   Ivonne Andrew, NP  ondansetron (ZOFRAN ODT) 8 MG disintegrating tablet Take 1 tablet (8 mg total) by mouth every 8 (eight) hours as needed for nausea or vomiting. 01/10/20   Massie Maroon, FNP  Oxycodone HCl 10 MG TABS Take 1 tablet (10 mg total) by mouth every 4 (four) hours as needed. 06/03/22   Massie Maroon, FNP  Vitamin D, Ergocalciferol, (DRISDOL) 1.25 MG (50000 UNIT) CAPS capsule Take 1 capsule (50,000 Units total) by mouth every 7 (seven) days. Mondays Patient taking differently: Take 50,000 Units by mouth every 30 (thirty) days. Mondays 01/29/21   Massie Maroon, FNP  cetirizine (ZYRTEC) 10 MG tablet Take 1 tablet (10 mg total) by mouth daily. Patient not taking: Reported on 11/30/2019 07/08/18 11/30/19  Mike Gip, FNP  fluticasone [facility]) 50 MCG/ACT nasal spray Place 2 sprays into both nostrils daily. Patient not taking: Reported on 11/30/2019 07/08/18 11/30/19  Mike Gip, FNP      Allergies  Aspirin and Sulfa antibiotics    Review of Systems   Review of Systems  All other systems reviewed and are negative.   Physical Exam Updated Vital Signs BP (!) 146/88   Pulse (!) 102   Temp 98.7 F (37.1 C) (Oral)   Resp 20   Ht 5\' 6"  (1.676 m)   Wt 74.8 kg   SpO2 94%   BMI 26.63 kg/m  Physical Exam Vitals and nursing note reviewed.  Constitutional:      General: He is in acute distress.     Appearance: Normal appearance. He is not ill-appearing (mild secondary to pain) or toxic-appearing.  HENT:     Head: Normocephalic  and atraumatic.     Mouth/Throat:     Mouth: Mucous membranes are moist.  Eyes:     Conjunctiva/sclera: Conjunctivae normal.  Cardiovascular:     Rate and Rhythm: Normal rate and regular rhythm.     Heart sounds: No murmur heard. Pulmonary:     Effort: Pulmonary effort is normal. No respiratory distress.     Breath sounds: Normal breath sounds. No stridor. No wheezing, rhonchi or rales.  Abdominal:     General: Abdomen is flat. There is no distension.     Palpations: Abdomen is soft.     Tenderness: There is no abdominal tenderness. There is no guarding or rebound.  Musculoskeletal:        General: No swelling or deformity.     Cervical back: Normal range of motion and neck supple. No rigidity.     Right lower leg: No edema.     Left lower leg: No edema.     Comments: Diffuse tenderness to bilateral upper extremities, soft compartments, no overlying skin changes, range of motion intact, neurovascularly intact distally, no calf tenderness bilaterally  Skin:    General: Skin is warm and dry.     Capillary Refill: Capillary refill takes less than 2 seconds.  Neurological:     General: No focal deficit present.     Mental Status: He is alert and oriented to person, place, and time.  Psychiatric:        Mood and Affect: Mood normal.        Behavior: Behavior normal.     ED Results / Procedures / Treatments   Labs (all labs ordered are listed, but only abnormal results are displayed) Labs Reviewed  COMPREHENSIVE METABOLIC PANEL - Abnormal; Notable for the following components:      Result Value   Sodium 131 (*)    CO2 19 (*)    Glucose, Bld 159 (*)    Total Protein 9.6 (*)    Total Bilirubin 4.6 (*)    All other components within normal limits  CBC WITH DIFFERENTIAL/PLATELET - Abnormal; Notable for the following components:   WBC 28.3 (*)    HCT 36.8 (*)    nRBC 2.0 (*)    Neutro Abs 21.3 (*)    Monocytes Absolute 2.0 (*)    Basophils Absolute 0.3 (*)    Abs Immature  Granulocytes 1.41 (*)    All other components within normal limits  RETICULOCYTES - Abnormal; Notable for the following components:   Retic Ct Pct 6.7 (*)    Retic Count, Absolute 280.5 (*)    Immature Retic Fract 37.2 (*)    All other components within normal limits  SARS CORONAVIRUS 2 BY RT PCR  TROPONIN I (HIGH SENSITIVITY)    EKG None  Radiology CT Angio Chest PE W  and/or Wo Contrast  Result Date: 07/29/2022 CLINICAL DATA:  Pulmonary embolism suspected, high probability. Sickle cell flare-up. Arm, back, and chest pain. EXAM: CT ANGIOGRAPHY CHEST WITH CONTRAST TECHNIQUE: Multidetector CT imaging of the chest was performed using the standard protocol during bolus administration of intravenous contrast. Multiplanar CT image reconstructions and MIPs were obtained to evaluate the vascular anatomy. RADIATION DOSE REDUCTION: This exam was performed according to the departmental dose-optimization program which includes automated exposure control, adjustment of the mA and/or kV according to patient size and/or use of iterative reconstruction technique. CONTRAST:  80mL OMNIPAQUE IOHEXOL 350 MG/ML SOLN COMPARISON:  03/23/2020 FINDINGS: Cardiovascular: . heart is normal in size and there is no pericardial effusion. The aorta and pulmonary trunk are normal in caliber. No evidence of pulmonary embolism. Mediastinum/Nodes: No mediastinal or hilar lymphadenopathy. Nonspecific prominent lymph nodes are present in the axillary regions bilaterally. The thyroid gland, trachea, and esophagus are within normal limits. Lungs/Pleura: Lungs are clear. No pleural effusion or pneumothorax. Upper Abdomen: The spleen is small and calcified, compatible with history of sickle cell disease. The gallbladder is surgically absent. Mild renal cortical scarring is noted bilaterally. 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 Mild degenerative changes are present in the thoracic spine. Increased  density is noted throughout the bones, compatible with history of sickle cell disease. No acute osseous abnormality. Review of the MIP images confirms the above findings. IMPRESSION: 1. No evidence of pulmonary embolism or other acute process. 2. Sequela of sickle cell disease. Electronically Signed   By: Thornell Sartorius M.D.   On: 07/29/2022 21:14   DG Chest Port 1 View  Result Date: 07/29/2022 CLINICAL DATA:  Sickle cell pain EXAM: PORTABLE CHEST 1 VIEW COMPARISON:  08/25/2020 FINDINGS: Heart and mediastinal contours are within normal limits. No focal opacities or effusions. No acute bony abnormality. Sclerosis within the humeral head cysts compatible with sickle cell. IMPRESSION: No active cardiopulmonary disease. Electronically Signed   By: Charlett Nose M.D.   On: 07/29/2022 18:46    Procedures Procedures    Medications Ordered in ED Medications  HYDROmorphone (DILAUDID) injection 0.5 mg ( Subcutaneous Canceled Entry 07/29/22 1827)  0.45 % sodium chloride infusion ( Intravenous New Bag/Given 07/29/22 1857)  HYDROmorphone (DILAUDID) injection 0.5 mg (0.5 mg Subcutaneous Given 07/29/22 1717)  oxyCODONE (Oxy IR/ROXICODONE) immediate release tablet 10 mg (10 mg Oral Given 07/29/22 1804)  HYDROmorphone (DILAUDID) injection 0.5 mg (0.5 mg Subcutaneous Given 07/29/22 1959)  iohexol (OMNIPAQUE) 350 MG/ML injection 80 mL (80 mLs Intravenous Contrast Given 07/29/22 2049)    ED Course/ Medical Decision Making/ A&P                             Medical Decision Making Amount and/or Complexity of Data Reviewed Labs: ordered. Decision-making details documented in ED Course. Radiology: ordered. Decision-making details documented in ED Course. ECG/medicine tests: ordered. Decision-making details documented in ED Course.  Risk Prescription drug management. Decision regarding hospitalization.   Medical Decision Making:   Trell Getz is a 47 y.o. male who presented to the ED today with sickle  cell pain detailed above.    Additional history discussed with patient's family/caregivers.  Patient's presentation is complicated by their history of SCA.  Complete initial physical exam performed, notably the patient was in mild distress secondary to pain.  He had tenderness to both arms but no deformity and range of motion intact.  Abdomen soft and nontender.  Neurologically intact.    Reviewed and confirmed nursing documentation for past medical history, family history, social history.    Initial Assessment:   With the patient's presentation, differential diagnosis includes but is not limited to vaso-occlusive crisis, dehydration, chronic pain, electrolyte disturbance, ACS, PNA, acute chest, PE.   This is most consistent with an acute complicated illness  Initial Plan:  Screening labs including CBC and Metabolic panel to evaluate for infectious or metabolic etiology of disease.  Reticulocytes to assess for sickle cell crisis CXR to evaluate for structural/infectious intrathoracic pathology.  EKG to evaluate for cardiac pathology Pain control/fluids for symptomatic control Objective evaluation as below reviewed   Initial Study Results:   Laboratory  All laboratory results reviewed without evidence of clinically relevant pathology.   Exceptions include: Na 131, WBC 28.3   Radiology:  All images reviewed independently. Agree with radiology report at this time.   CT Angio Chest PE W and/or Wo Contrast  Result Date: 07/29/2022 CLINICAL DATA:  Pulmonary embolism suspected, high probability. Sickle cell flare-up. Arm, back, and chest pain. EXAM: CT ANGIOGRAPHY CHEST WITH CONTRAST TECHNIQUE: Multidetector CT imaging of the chest was performed using the standard protocol during bolus administration of intravenous contrast. Multiplanar CT image reconstructions and MIPs were obtained to evaluate the vascular anatomy. RADIATION DOSE REDUCTION: This exam was performed according to the departmental  dose-optimization program which includes automated exposure control, adjustment of the mA and/or kV according to patient size and/or use of iterative reconstruction technique. CONTRAST:  80mL OMNIPAQUE IOHEXOL 350 MG/ML SOLN COMPARISON:  03/23/2020 FINDINGS: Cardiovascular: . heart is normal in size and there is no pericardial effusion. The aorta and pulmonary trunk are normal in caliber. No evidence of pulmonary embolism. Mediastinum/Nodes: No mediastinal or hilar lymphadenopathy. Nonspecific prominent lymph nodes are present in the axillary regions bilaterally. The thyroid gland, trachea, and esophagus are within normal limits. Lungs/Pleura: Lungs are clear. No pleural effusion or pneumothorax. Upper Abdomen: The spleen is small and calcified, compatible with history of sickle cell disease. The gallbladder is surgically absent. Mild renal cortical scarring is noted bilaterally. 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 Mild degenerative changes are present in the thoracic spine. Increased density is noted throughout the bones, compatible with history of sickle cell disease. No acute osseous abnormality. Review of the MIP images confirms the above findings. IMPRESSION: 1. No evidence of pulmonary embolism or other acute process. 2. Sequela of sickle cell disease. Electronically Signed   By: Thornell Sartorius M.D.   On: 07/29/2022 21:14   DG Chest Port 1 View  Result Date: 07/29/2022 CLINICAL DATA:  Sickle cell pain EXAM: PORTABLE CHEST 1 VIEW COMPARISON:  08/25/2020 FINDINGS: Heart and mediastinal contours are within normal limits. No focal opacities or effusions. No acute bony abnormality. Sclerosis within the humeral head cysts compatible with sickle cell. IMPRESSION: No active cardiopulmonary disease. Electronically Signed   By: Charlett Nose M.D.   On: 07/29/2022 18:46   DG Foot Complete Left  Result Date: 07/25/2022 Please see detailed radiograph report in office note.      Consults: Case discussed with Dr. Arlean Hopping, HPS, who accepts admission.   Final Assessment and Plan:   46 year old male presented to the ED with complaints of sickle cell pain.  Typically has well-controlled sickle cell anemia.  No relief with his home oxycodone.  Patient expressed chest pain as well as pain in both of his arms and his back.  Troponin normal.  EKG without acute ST  ST changes.  Normal chest x-ray.  Patient is intermittently tachycardic and tachypneic.  When talking, he did have an oxygen dropped into the low 90s.  No history of clots.  Normal hemoglobin.  Minimal hyponatremia, do not suspect that this is reason for patient's symptoms.  He typically has a leukocytosis around 16 at baseline.  States that it does become elevated with his sickle cell crises but never to this level.  Today it is around 28.3.  With findings, obtain CT PE study for further evaluation of possible clot versus infectious process.  No acute findings on CT PE study.  With ambulation, patient does maintain oxygen saturation around 94%.  He does become increasingly tachycardic up to the high 120s.  With this, discussed case with attending physician who cosigned this note and agrees to consult for admission for observation.  Patient agreeable with this plan.  Discussed with hospitalist who will admit for observation.  Patient stable and tolerating p.o. at time of admission.  His pain is well-controlled.   Clinical Impression:  1. Sickle cell anemia with pain (HCC)   2. Tachycardia      Admit           Final Clinical Impression(s) / ED Diagnoses Final diagnoses:  Sickle cell anemia with pain (HCC)  Tachycardia    Rx / DC Orders ED Discharge Orders     None         [facility] Dopp 07/29/22 2222    Vanetta Mulders, MD 07/29/22 2224

## 2022-07-29 NOTE — ED Provider Notes (Signed)
I provided a substantive portion of the care of this patient.  I personally made/approved the management plan for this patient and take responsibility for the patient management.     Patient seen by me along with physician assistant.  Patient has a history of sickle cell disease.  He thought it was a sickle cell flareup this morning.  He is complaining of arm pain back pain and chest pain and did have a little bit of feeling of shortness of breath earlier that has resolved currently.  Current oxygen saturation is 97%.  Patient just came back from CT angio chest we will see what those results show.  If they show blood clot obviously he needs admission if they showed pneumonia fail to feel like he needs admission as well since he is Sickler as well white blood cell count here is fairly high at 28.3 which is higher than usual for him his COVID is negative electrolytes without significant abnormalities other than CO2 was 19.  We are also planning to walk him and see what his oxygen sats do if he gets hypoxic but that he will require admission as well.  Patient's troponin was normal at 16 so that is reassuring chest x-ray had no acute cardiopulmonary process.  Overall patient states he feels better.  And is no longer feeling short of breath.   Vanetta Mulders, MD 07/29/22 2110

## 2022-07-29 NOTE — H&P (Signed)
History and Physical      Gregory Bryant WUJ:[id number] DOB: May 31, 1975 DOA: 07/29/2022; DOS: 07/29/2022  PCP: Massie Maroon, FNP  Patient coming from: home   I have personally briefly reviewed patient's old medical records in [facility]  Chief Complaint: Generalized pain  HPI: Gregory Bryant is a 47 y.o. 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   In this patient with a documented history of sickle cell disease, the patient reports 2 days of sharp discomfort involving the bilateral upper extremities, back, and chest, in distribution, quality, and intensity that is consistent with pain experienced at times of their prior acute sickle cell pain crisis.  He notes that the chest pain is also similar to that which she has experienced at times of previous sickle cell pain crises, and notes that the chest pain is nonexertional, and nonpleuritic.  He reports mild shortness of breath, in the absence of any associated orthopnea, PND, or worsening of peripheral edema.  No recent preceding trauma. No recent headache, neck stiffness, rash, cough, abdominal pain, dysuria, or gross hematuria.  Denies any associated palpitations, dizziness, nausea, vomiting, diarrhea, dizziness, presyncope, or syncope.  also denies any recent subjective fever, chills, rigors, or generalized myalgias.    Denies any routine or recent alcohol consumption, and denies any history of recreational drug use.  The patient reports poor pain control at home in spite of use of prn oxycodone 10 mg every 4 hours as needed,  prompting him to present to [facility] ED today for further evaluation  and management thereof.  He notes that he has been spending time in the warm July ambient temperatures, and acknowledges that there has not been compensatory increase in water in the setting.  No recent known sick contacts.  Per chart review, the patient has documented history of chronic leukocytosis, with baseline white blood cell count range noted to be 13-27 dating back to at least May 2010.    ED Course:  Vital signs in the ED were notable for the following: Afebrile; heart rates in the 80s to low 100s; systolic blood pressures in the 140s to 150s; respiratory rate  18-22, oxygen saturation 94 to 100% on room air.  Labs were notable for the following: CMP notable for blood cell count 131, correcting to approximately 132 when taking into account degree of presenting glucose level, relative to most recent prior serum sodium data point of 136 in July 2023, creatinine 0.82 compared to 0.81 in July 2023, glucose 159, total bilirubin 4.8, otherwise, liver enzymes within normal limits.  High sensitive troponin I x 1 was found be 16.  CBC notable for white cell count 28,300 with 75% neutrophils, hemoglobin 13.2, unchanged from most recent prior hemoglobin data point on 05/13/2022, platelet count 387.  Reticulocyte count 6.7% compared to 7.1% in July 2023.  COVID-19 PCR negative.  Per my interpretation, EKG in ED demonstrated the following: Sinus rhythm with heart rate 95, normal intervals, no evidence of T wave or ST changes, including no evidence of ST elevation.  Imaging in the ED, per corresponding formal radiology read, was notable for the following: CTA of the chest with pulmonary embolism protocol showed no evidence of acute cardiopulmonary  process, including no evidence of acute pulmonary embolism, no evidence of infiltrate, edema, effusion, or pneumothorax.  While in the ED, the following were administered: Dilaudid 0.5 mg IV x 3 doses, oxycodone 10 mg p.o. x 1 dose.  In the setting of poorly  controlled pain following at least 3 doses of IV analgesics in the ED, the patient is subsequently being admitted for further evaluation/management of presenting acute sickle cell pain crisis, including an emphasis on pain control.   Subsequently, the patient was admitted for further evaluation management of presenting acute sickle cell pain crisis, with presenting labs also notable for mild acute hyponatremia.     Review of Systems: As per HPI otherwise 10 point review of systems negative.   Past Medical History:  Diagnosis Date   Diabetes mellitus without complication (HCC)    Sickle cell anemia (HCC)     Past Surgical History:  Procedure Laterality Date   CHOLECYSTECTOMY      Social History:  reports that he has never smoked. He has never used smokeless tobacco. He reports that he does not drink alcohol and does not use drugs.   Allergies  Allergen Reactions   Aspirin Other (See Comments)    unknown   Sulfa Antibiotics Other (See Comments)    unknown    Family History  Problem Relation Age of Onset   Diabetes Mother     Family history reviewed and not pertinent    Prior to Admission medications   Medication Sig Start Date End Date Taking? Authorizing Provider  Blood Glucose Monitoring Suppl DEVI 1 each by Does not apply route 2 (two) times daily. May substitute to any manufacturer covered by patient's insurance. 05/15/22   Massie Maroon, FNP  diclofenac (VOLTAREN) 75 MG EC tablet Take 1 tablet (75 mg total) by mouth 2 (two) times daily. Patient not taking: Reported on 07/11/2022 07/03/22   Lenn Sink, DPM  Dulaglutide (TRULICITY) 0.75 MG/0.5ML SOPN Inject 0.75 mg into the skin once a week. Patient not taking: Reported on 07/11/2022 05/15/22 08/07/22  Massie Maroon, FNP  folic acid (FOLVITE) 1 MG tablet Take 1 tablet (1 mg total) by mouth daily. 11/05/21   Massie Maroon, FNP  glipiZIDE (GLUCOTROL) 10 MG tablet Take 1 tablet (10 mg total) by mouth daily  before breakfast. 11/05/21   Massie Maroon, FNP  ibuprofen (ADVIL) 600 MG tablet Take 1 tablet (600 mg total) by mouth every 8 (eight) hours as needed. 08/06/21   Massie Maroon, FNP  lisinopril (ZESTRIL) 5 MG tablet Take 1 tablet (5 mg total) by mouth daily. 05/16/22   Massie Maroon, FNP  metFORMIN (GLUCOPHAGE) 1000 MG tablet Take 1 tablet (1,000 mg total) by mouth 2 (two) times daily with a meal. 11/05/21 07/11/22  Massie Maroon, FNP  mupirocin ointment (BACTROBAN) 2 % Apply 1 Application topically 2 (two) times daily. 07/11/22   Ivonne Andrew, NP  ondansetron (ZOFRAN ODT) 8 MG disintegrating tablet Take 1 tablet (8 mg total) by mouth every 8 (eight) hours as needed for nausea or vomiting. 01/10/20   Massie Maroon, FNP  Oxycodone HCl 10 MG TABS Take 1 tablet (10 mg total) by mouth every 4 (four) hours as needed. 06/03/22   Massie Maroon, FNP  Vitamin D, Ergocalciferol, (DRISDOL) 1.25 MG (50000 UNIT) CAPS capsule Take 1 capsule (50,000 Units total) by mouth every 7 (seven) days. Mondays Patient taking differently: Take 50,000 Units by mouth every 30 (thirty) days. Mondays  01/29/21   Massie Maroon, FNP  cetirizine (ZYRTEC) 10 MG tablet Take 1 tablet (10 mg total) by mouth daily. Patient not taking: Reported on 11/30/2019 07/08/18 11/30/19  Mike Gip, FNP  fluticasone [facility]) 50 MCG/ACT nasal spray Place 2 sprays into both nostrils daily. Patient not taking: Reported on 11/30/2019 07/08/18 11/30/19  Mike Gip, FNP     Objective    Physical Exam: Vitals:   07/29/22 1720 07/29/22 1800 07/29/22 1900 07/29/22 2103  BP: (!) 142/90 (!) 145/86 (!) 146/88   Pulse: 88 80 96 (!) 102  Resp: (!) 22 20 20    Temp:    98.7 F (37.1 C)  TempSrc:    Oral  SpO2: 100% 99% 94%   Weight:      Height:        General: appears to be stated age; alert, oriented Skin: warm, dry, no rash Head:  AT/[place] Mouth:  Oral mucosa membranes appear dry, normal dentition Neck: supple; trachea  midline Heart:  RRR; did not appreciate any M/R/G Lungs: CTAB, did not appreciate any wheezes, rales, or rhonchi Abdomen: + BS; soft, ND, NT Vascular: 2+ pedal pulses b/l; 2+ radial pulses b/l Extremities: no peripheral edema, no muscle wasting Neuro: strength and sensation intact in upper and lower extremities b/l    Labs on Admission: I have personally reviewed following labs and imaging studies  CBC: Recent Labs  Lab 07/29/22 1846  WBC 28.3*  NEUTROABS 21.3*  HGB 13.2  HCT 36.8*  MCV 81.6  PLT 387   Basic Metabolic Panel: Recent Labs  Lab 07/29/22 1846  NA 131*  K 3.5  CL 102  CO2 19*  GLUCOSE 159*  BUN 12  CREATININE 0.84  CALCIUM 9.8   GFR: Estimated Creatinine Clearance: 98.1 mL/min (by C-G formula based on SCr of 0.84 mg/dL). Liver Function Tests: Recent Labs  Lab 07/29/22 1846  AST 39  ALT 29  ALKPHOS 103  BILITOT 4.6*  PROT 9.6*  ALBUMIN 5.0   No results for input(s): "LIPASE", "AMYLASE" in the last 168 hours. No results for input(s): "AMMONIA" in the last 168 hours. Coagulation Profile: No results for input(s): "INR", "PROTIME" in the last 168 hours. Cardiac Enzymes: No results for input(s): "CKTOTAL", "CKMB", "CKMBINDEX", "TROPONINI" in the last 168 hours. BNP (last 3 results) No results for input(s): "PROBNP" in the last 8760 hours. HbA1C: No results for input(s): "HGBA1C" in the last 72 hours. CBG: No results for input(s): "GLUCAP" in the last 168 hours. Lipid Profile: No results for input(s): "CHOL", "HDL", "LDLCALC", "TRIG", "CHOLHDL", "LDLDIRECT" in the last 72 hours. Thyroid Function Tests: No results for input(s): "TSH", "T4TOTAL", "FREET4", "T3FREE", "THYROIDAB" in the last 72 hours. Anemia Panel: Recent Labs    07/29/22 1846  RETICCTPCT 6.7*   Urine analysis:    Component Value Date/Time   COLORURINE YELLOW 03/23/2020 1355   APPEARANCEUR CLEAR 03/23/2020 1355   LABSPEC 1.012 03/23/2020 1355   PHURINE 6.0 03/23/2020 1355    GLUCOSEU 150 (A) 03/23/2020 1355   HGBUR NEGATIVE 03/23/2020 1355   BILIRUBINUR negative 11/05/2021 1405   BILIRUBINUR negative 04/25/2021 1336   KETONESUR negative 11/05/2021 1405   KETONESUR 5 (A) 03/23/2020 1355   PROTEINUR Negative 04/25/2021 1336   PROTEINUR NEGATIVE 03/23/2020 1355   UROBILINOGEN 1.0 11/05/2021 1405   UROBILINOGEN 1.0 09/20/2014 1908   NITRITE Negative 11/05/2021 1405   NITRITE negative 04/25/2021 1336   NITRITE NEGATIVE 03/23/2020 1355   LEUKOCYTESUR Negative 11/05/2021 1405   LEUKOCYTESUR NEGATIVE 03/23/2020  1355    Radiological Exams on Admission: CT Angio Chest PE W and/or Wo Contrast  Result Date: 07/29/2022 CLINICAL DATA:  Pulmonary embolism suspected, high probability. Sickle cell flare-up. Arm, back, and chest pain. EXAM: CT ANGIOGRAPHY CHEST WITH CONTRAST TECHNIQUE: Multidetector CT imaging of the chest was performed using the standard protocol during bolus administration of intravenous contrast. Multiplanar CT image reconstructions and MIPs were obtained to evaluate the vascular anatomy. RADIATION DOSE REDUCTION: This exam was performed according to the departmental dose-optimization program which includes automated exposure control, adjustment of the mA and/or kV according to patient size and/or use of iterative reconstruction technique. CONTRAST:  80mL OMNIPAQUE IOHEXOL 350 MG/ML SOLN COMPARISON:  03/23/2020 FINDINGS: Cardiovascular: . heart is normal in size and there is no pericardial effusion. The aorta and pulmonary trunk are normal in caliber. No evidence of pulmonary embolism. Mediastinum/Nodes: No mediastinal or hilar lymphadenopathy. Nonspecific prominent lymph nodes are present in the axillary regions bilaterally. The thyroid gland, trachea, and esophagus are within normal limits. Lungs/Pleura: Lungs are clear. No pleural effusion or pneumothorax. Upper Abdomen: The spleen is small and calcified, compatible with history of sickle cell disease. The  gallbladder is surgically absent. Mild renal cortical scarring is noted bilaterally. 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 Mild degenerative changes are present in the thoracic spine. Increased density is noted throughout the bones, compatible with history of sickle cell disease. No acute osseous abnormality. Review of the MIP images confirms the above findings. IMPRESSION: 1. No evidence of pulmonary embolism or other acute process. 2. Sequela of sickle cell disease. Electronically Signed   By: Thornell Sartorius M.D.   On: 07/29/2022 21:14   DG Chest Port 1 View  Result Date: 07/29/2022 CLINICAL DATA:  Sickle cell pain EXAM: PORTABLE CHEST 1 VIEW COMPARISON:  08/25/2020 FINDINGS: Heart and mediastinal contours are within normal limits. No focal opacities or effusions. No acute bony abnormality. Sclerosis within the humeral head cysts compatible with sickle cell. IMPRESSION: No active cardiopulmonary disease. Electronically Signed   By: Charlett Nose M.D.   On: 07/29/2022 18:46      Assessment/Plan    Principal Problem:   Sickle cell pain crisis (HCC) Active Problems:   DM2 (diabetes mellitus, type 2) (HCC)   Acute hyponatremia   H/O leukocytosis     #) Acute Sickle Cell Pain Crisis: In the setting of a known h/o sickle cell disease w/ multiple previous episodes of acute sickle cell pain crises requiring hospitalizaiton, the patient presents with 2 days of bilateral upper extremity, back, chest discomfort of distribution, quality, and intensity consistent with that experienced at times of previous sickle cell pain crises, and poorly controlled via home analgesic regimen. Hemoglobin similar to most recent prior, as quantified above.  Increased reticulocyte count percent of 6.7% is noted. Pain remains poorly controlled after three doses of IV analgesics in the ED today. In this setting, will admit for further optimization of pain control, and will plan to  aggressively treat pain, as further described below, w/ close monitoring for development of respiratory depression.   No clinical or radiographic evidence of Acute Chest Syndrome at this time.  Additionally, EKG shows no evidence of acute ischemic changes, while CTA chest shows no evidence of acute cardiopulmonary process, including no evidence of acute pulmonary embolism, infiltrate, or edema.  In terms of potential exacerbating factors contributing to presenting SS pain crisis, suspect potential contribution from dehydration given recent hot ambient temperatures well into the 90s, without  compensatory increase in water to offset the likely associated ostensible losses associated with this climate. no overt e/o underlying infection at this time, including CTA chest which shows no evidence of acute cardiopulmonary process, including no evidence of infiltrate..  Will also check urinalysis to further evaluate.  Will initiate Dilaudid PCA upon arriving on the medical floor. Consequently, and in the setting of underlying opioid resistance, will initiate Dilaudid PCA for acute sickle cell pain crisis with specific settings within the following parameters: 0.01 - 0.02 mg/kg with 10 minute lockout interval and 1 hour limit of 0.06 mg/kg, as further quantified below.  Patient's most recent recorded body weight noted to be  74.8 kg.   No indication for exchange transfusion at this time.   Plan: Repeat reticulocyte count and CBC in AM.  Type/screen. Monitor on telemetry. Sickle cell pain assessment per protocol. Aggressive opioid analgesia in the form of Dilaudid PCA, with the following initial settings: bolus dose of 0.3 mg with 10 minute lockout interval and 1 hour limit of 5 mg. close monitoring for development of respiratory depression. end tidal CO2 monitoring. Prn narcan. Monitor continuous pulse oximetry. Prn supplemental O2 in order to maintain O2 sats between 90-92%, with care to not over-oxygenate as this  can suppress bone marrow production of rbc's.  Monitor strict I's and O's.  Close monitoring of renal function, including repeat CMP in the AM. incentive spirometry to decrease risk of development of atelectasis. Prn Benadryl for pruritus. Gentle IVF's via 1/2NS at 75 cc/h with caution to not induce volume overload.  Prn MiraLAX.  Check urinalysis, urinary drug screen.                #) Acute hypo-osmolar  hyponatremia: Corrected serum sodium of approximately 132 relative demonstration prior serum sodium data point of 136 in July 2023 . suspect an element of hypovolumeia, with suspected contribution from dehydration in the setting of clinical evidence of such as well as report of exposure to warm ambient temperatures confirming likely ostensible losses without compensatory increase in water consumption.  Differential also includes the possibility of a contribution from pain mediated SIADH. in general, will provide gentle IV fluids to attend to suspected contribution from dehydration, while further evaluating for any additional contributing factors, including SIADH, as further detailed below. Will also check TSH. No overt pharmacologic contributions. Of note, no evidence of associated acute focal neurologic deficits and no report of recent trauma.  No acute evidence of volume overload or acute pulmonary pathology, with CTA chest showing no evidence of acute process, as above.  However, as the patient is at increased risk for development of volume overload in the setting of acute sickle cell pain crisis, will also add on BNP.  Additionally, with this in mind, will be gentle with initiation of IV fluids, as outlined above.  Plan: monitor strict I's and O's and daily weights.  check UA, random urine sodium, urine osmolality.  Check serum osmolality to confirm suspected hypoosmolar etiology.  Repeat CMP in the morning. Check TSH. Gentle IVF's, as above.  Check BNP, INR.                #)  Chronic leukocytosis: For at least the last 14 years, the patient has a documented history of chronic leukocytosis with baseline white blood cell count range of 13-27.  Presenting episode count is consistent with this range.  Consequently, will refrain from aggressive additional inpatient workup of this chronic, baseline finding at this time.  As  noted above, no overt evidence of underlying infectious process at this time, but will also check urinalysis.  Plan: Monitor strict I's and O's Daily weights.  Gentle IV fluids, as above.  Repeat CBC with differential in the morning.  Check urinalysis.                  #) Type 2 Diabetes Mellitus: documented history of such. Home insulin regimen: None. Home oral hypoglycemic agents: Metformin, glipizide. presenting blood sugar: 159. Most recent A1c noted to be 5.5% when checked in October 2023.  Plan: accuchecks QAC and HS with low dose SSI. hold home oral hypoglycemic agents during this hospitalization.  Add on hemoglobin A1c level.      DVT prophylaxis: SCD's   Code Status: Full code Family Communication: none Disposition Plan: Per Rounding Team Consults called: none;  Admission status: obs    I SPENT GREATER THAN 75  MINUTES IN CLINICAL CARE TIME/MEDICAL DECISION-MAKING IN COMPLETING THIS ADMISSION.     Chaney Born Shraga Custard DO [facility] From 7PM - 7AM   07/29/2022, 10:08 PM

## 2022-07-29 NOTE — ED Notes (Signed)
ED TO INPATIENT HANDOFF REPORT  Name/Age/Gender Gregory Bryant 47 y.o. male  Code Status    Code Status Orders  (From admission, onward)           Start     Ordered   07/29/22 2202  Full code  Continuous       Question:  By:  Answer:  Consent: discussion documented in EHR   07/29/22 2202           Code Status History     Date Active Date Inactive Code Status Order ID Comments User Context   08/25/2020 0922 08/26/2020 2217 Full Code [id number]  Reva Bores, MD ED   01/11/2018 0958 01/11/2018 1854 Full Code [id number]  Massie Maroon, FNP Inpatient       Home/SNF/Other Home  Chief Complaint Sickle cell pain crisis (HCC) [D57.00]  Level of Care/Admitting Diagnosis ED Disposition     ED Disposition  Admit   Condition  --   Comment  Hospital Area: [facility] [[id number]]  Level of Care: Telemetry [5]  Admit to tele based on following criteria: Monitor for Ischemic changes  May place patient in observation at [facility] or [facility] if equivalent level of care is available:: No  Covid Evaluation: Asymptomatic - no recent exposure (last 10 days) testing not required  Diagnosis: Sickle cell pain crisis [facility]) [[id number]]  Admitting Physician: Angie Fava [[id number]]  Attending Physician: Angie Fava [[id number]]          Medical History Past Medical History:  Diagnosis Date   Diabetes mellitus without complication (HCC)    Sickle cell anemia (HCC)     Allergies Allergies  Allergen Reactions   Aspirin Other (See Comments)    unknown   Sulfa Antibiotics Other (See Comments)    unknown    IV Location/Drains/Wounds Patient Lines/Drains/Airways Status     Active Line/Drains/Airways     Name Placement date Placement time Site Days   Peripheral IV 07/29/22 20 G Left Antecubital 07/29/22  1853  Antecubital  less than 1            Labs/Imaging Results for orders placed or performed during the  hospital encounter of 07/29/22 (from the past 48 hour(s))  Comprehensive metabolic panel     Status: Abnormal   Collection Time: 07/29/22  6:46 PM  Result Value Ref Range   Sodium 131 (L) 135 - 145 mmol/L   Potassium 3.5 3.5 - 5.1 mmol/L   Chloride 102 98 - 111 mmol/L   CO2 19 (L) 22 - 32 mmol/L   Glucose, Bld 159 (H) 70 - 99 mg/dL    Comment: Glucose reference range applies only to samples taken after fasting for at least 8 hours.   BUN 12 6 - 20 mg/dL   Creatinine, Ser 5.28 0.61 - 1.24 mg/dL   Calcium 9.8 8.9 - 41.3 mg/dL   Total Protein 9.6 (H) 6.5 - 8.1 g/dL   Albumin 5.0 3.5 - 5.0 g/dL   AST 39 15 - 41 U/L   ALT 29 0 - 44 U/L   Alkaline Phosphatase 103 38 - 126 U/L   Total Bilirubin 4.6 (H) 0.3 - 1.2 mg/dL   GFR, Estimated >24 >40 mL/min    Comment: (NOTE) Calculated using the CKD-EPI Creatinine Equation (2021)    Anion gap 10 5 - 15    Comment: Performed at [facility], [address] [place], [place] [address]  CBC with Differential  Status: Abnormal   Collection Time: 07/29/22  6:46 PM  Result Value Ref Range   WBC 28.3 (H) 4.0 - 10.5 K/uL   RBC 4.51 4.22 - 5.81 MIL/uL   Hemoglobin 13.2 13.0 - 17.0 g/dL   HCT 40.9 (L) 81.1 - 91.4 %   MCV 81.6 80.0 - 100.0 fL   MCH 29.3 26.0 - 34.0 pg   MCHC 35.9 30.0 - 36.0 g/dL   RDW 78.2 95.6 - 21.3 %   Platelets 387 150 - 400 K/uL   nRBC 2.0 (H) 0.0 - 0.2 %   Neutrophils Relative % 75 %   Neutro Abs 21.3 (H) 1.7 - 7.7 K/uL   Lymphocytes Relative 11 %   Lymphs Abs 3.0 0.7 - 4.0 K/uL   Monocytes Relative 7 %   Monocytes Absolute 2.0 (H) 0.1 - 1.0 K/uL   Eosinophils Relative 1 %   Eosinophils Absolute 0.3 0.0 - 0.5 K/uL   Basophils Relative 1 %   Basophils Absolute 0.3 (H) 0.0 - 0.1 K/uL   WBC Morphology VACUOLATED NEUTROPHILS    RBC Morphology RARE NRBC'S    Immature Granulocytes 5 %   Abs Immature Granulocytes 1.41 (H) 0.00 - 0.07 K/uL   Polychromasia PRESENT    Target Cells PRESENT     Comment:  Performed at [facility], [address], [place], [place] [address]  Reticulocytes     Status: Abnormal   Collection Time: 07/29/22  6:46 PM  Result Value Ref Range   Retic Ct Pct 6.7 (H) 0.4 - 3.1 %    Comment: RESULTS CONFIRMED BY MANUAL DILUTION   RBC. 4.53 4.22 - 5.81 MIL/uL   Retic Count, Absolute 280.5 (H) 19.0 - 186.0 K/uL   Immature Retic Fract 37.2 (H) 2.3 - 15.9 %    Comment: Performed at [facility], [address] [place], [place] [address]  Troponin I (High Sensitivity)     Status: None   Collection Time: 07/29/22  6:46 PM  Result Value Ref Range   Troponin I (High Sensitivity) 16 <18 ng/L    Comment: (NOTE) Elevated high sensitivity troponin I (hsTnI) values and significant  changes across serial measurements may suggest ACS but many other  chronic and acute conditions are known to elevate hsTnI results.  Refer to the "Links" section for chest pain algorithms and additional  guidance. Performed at [facility], [address] [place], [place] [address]   SARS Coronavirus 2 by RT PCR (hospital order, performed in [facility] hospital lab) *cepheid single result test* Anterior Nasal Swab     Status: None   Collection Time: 07/29/22  8:21 PM   Specimen: Anterior Nasal Swab  Result Value Ref Range   SARS Coronavirus 2 by RT PCR NEGATIVE NEGATIVE    Comment: (NOTE) SARS-CoV-2 target nucleic acids are NOT DETECTED.  The SARS-CoV-2 RNA is generally detectable in upper and lower respiratory specimens during the acute phase of infection. The lowest concentration of SARS-CoV-2 viral copies this assay can detect is 250 copies / mL. A negative result does not preclude SARS-CoV-2 infection and should not be used as the sole basis for treatment or other patient management decisions.  A negative result may occur with improper specimen collection / handling, submission of specimen other than nasopharyngeal swab,  presence of viral mutation(s) within the areas targeted by this assay, and inadequate number of viral copies (<250 copies / mL). A negative result must be combined with clinical observations,  patient history, and epidemiological information.  Fact Sheet for Patients:   RoadLapTop.co.za  Fact Sheet for Healthcare Providers: http://kim-miller.com/  This test is not yet approved or  cleared by the [place] FDA and has been authorized for detection and/or diagnosis of SARS-CoV-2 by FDA under an Emergency Use Authorization (EUA).  This EUA will remain in effect (meaning this test can be used) for the duration of the COVID-19 declaration under Section 564(b)(1) of the Act, 21 U.S.C. section 360bbb-3(b)(1), unless the authorization is terminated or revoked sooner.  Performed at [facility], [address], [place], [place] [address]    CT Angio Chest PE W and/or Wo Contrast  Result Date: 07/29/2022 CLINICAL DATA:  Pulmonary embolism suspected, high probability. Sickle cell flare-up. Arm, back, and chest pain. EXAM: CT ANGIOGRAPHY CHEST WITH CONTRAST TECHNIQUE: Multidetector CT imaging of the chest was performed using the standard protocol during bolus administration of intravenous contrast. Multiplanar CT image reconstructions and MIPs were obtained to evaluate the vascular anatomy. RADIATION DOSE REDUCTION: This exam was performed according to the departmental dose-optimization program which includes automated exposure control, adjustment of the mA and/or kV according to patient size and/or use of iterative reconstruction technique. CONTRAST:  80mL OMNIPAQUE IOHEXOL 350 MG/ML SOLN COMPARISON:  03/23/2020 FINDINGS: Cardiovascular: . heart is normal in size and there is no pericardial effusion. The aorta and pulmonary trunk are normal in caliber. No evidence of pulmonary embolism. Mediastinum/Nodes: No mediastinal or hilar  lymphadenopathy. Nonspecific prominent lymph nodes are present in the axillary regions bilaterally. The thyroid gland, trachea, and esophagus are within normal limits. Lungs/Pleura: Lungs are clear. No pleural effusion or pneumothorax. Upper Abdomen: The spleen is small and calcified, compatible with history of sickle cell disease. The gallbladder is surgically absent. Mild renal cortical scarring is noted bilaterally. 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 Mild degenerative changes are present in the thoracic spine. Increased density is noted throughout the bones, compatible with history of sickle cell disease. No acute osseous abnormality. Review of the MIP images confirms the above findings. IMPRESSION: 1. No evidence of pulmonary embolism or other acute process. 2. Sequela of sickle cell disease. Electronically Signed   By: Thornell Sartorius M.D.   On: 07/29/2022 21:14   DG Chest Port 1 View  Result Date: 07/29/2022 CLINICAL DATA:  Sickle cell pain EXAM: PORTABLE CHEST 1 VIEW COMPARISON:  08/25/2020 FINDINGS: Heart and mediastinal contours are within normal limits. No focal opacities or effusions. No acute bony abnormality. Sclerosis within the humeral head cysts compatible with sickle cell. IMPRESSION: No active cardiopulmonary disease. Electronically Signed   By: Charlett Nose M.D.   On: 07/29/2022 18:46    Pending Labs Unresulted Labs (From admission, onward)     Start     Ordered   07/30/22 0500  CBC with Differential/Platelet  Tomorrow morning,   R        07/29/22 2202   07/30/22 0500  Comprehensive metabolic panel  Tomorrow morning,   R        07/29/22 2202   07/30/22 0500  Magnesium  Tomorrow morning,   R        07/29/22 2202   07/30/22 0500  Phosphorus  Tomorrow morning,   R        07/29/22 2202   07/29/22 2203  Magnesium  Add-on,   AD        07/29/22 2202            Vitals/Pain  Today's Vitals   07/29/22 1839 07/29/22 1900 07/29/22 2032  07/29/22 2103  BP:  (!) 146/88    Pulse:  96  (!) 102  Resp:  20    Temp:    98.7 F (37.1 C)  TempSrc:    Oral  SpO2:  94%    Weight:      Height:      PainSc: 7   5      Isolation Precautions No active isolations  Medications Medications  HYDROmorphone (DILAUDID) injection 0.5 mg ( Subcutaneous Canceled Entry 07/29/22 1827)  0.45 % sodium chloride infusion ( Intravenous New Bag/Given 07/29/22 1857)  acetaminophen (TYLENOL) tablet 650 mg (has no administration in time range)    Or  acetaminophen (TYLENOL) suppository 650 mg (has no administration in time range)  melatonin tablet 3 mg (has no administration in time range)  ondansetron (ZOFRAN) injection 4 mg (has no administration in time range)  HYDROmorphone (DILAUDID) injection 1 mg (has no administration in time range)  naloxone (NARCAN) injection 0.4 mg (has no administration in time range)    And  sodium chloride flush (NS) 0.9 % injection 9 mL (has no administration in time range)  diphenhydrAMINE (BENADRYL) capsule 25 mg (has no administration in time range)  HYDROmorphone (DILAUDID) 1 mg/mL PCA injection (has no administration in time range)  HYDROmorphone (DILAUDID) injection 0.5 mg (0.5 mg Subcutaneous Given 07/29/22 1717)  oxyCODONE (Oxy IR/ROXICODONE) immediate release tablet 10 mg (10 mg Oral Given 07/29/22 1804)  HYDROmorphone (DILAUDID) injection 0.5 mg (0.5 mg Subcutaneous Given 07/29/22 1959)  iohexol (OMNIPAQUE) 350 MG/ML injection 80 mL (80 mLs Intravenous Contrast Given 07/29/22 2049)    Mobility walks

## 2022-07-29 NOTE — ED Triage Notes (Signed)
Sickle cell flare up that started this AM. Tylenol and Oxycodone 10mg  PTA with no relief. C/o arm pain, back pain, chest pain.

## 2022-07-29 NOTE — ED Notes (Signed)
pt was ambulated SpO2 remained between 94-98% and heart rate between 116-120

## 2022-07-29 NOTE — ED Notes (Signed)
Unsuccessful IV attempt x2.  

## 2022-07-30 DIAGNOSIS — G894 Chronic pain syndrome: Secondary | ICD-10-CM | POA: Diagnosis present

## 2022-07-30 DIAGNOSIS — Z1152 Encounter for screening for COVID-19: Secondary | ICD-10-CM | POA: Diagnosis not present

## 2022-07-30 DIAGNOSIS — Z7984 Long term (current) use of oral hypoglycemic drugs: Secondary | ICD-10-CM | POA: Diagnosis not present

## 2022-07-30 DIAGNOSIS — L299 Pruritus, unspecified: Secondary | ICD-10-CM | POA: Diagnosis present

## 2022-07-30 DIAGNOSIS — D72829 Elevated white blood cell count, unspecified: Secondary | ICD-10-CM | POA: Diagnosis present

## 2022-07-30 DIAGNOSIS — R000 Tachycardia, unspecified: Secondary | ICD-10-CM | POA: Diagnosis present

## 2022-07-30 DIAGNOSIS — Z79899 Other long term (current) drug therapy: Secondary | ICD-10-CM | POA: Diagnosis not present

## 2022-07-30 DIAGNOSIS — E871 Hypo-osmolality and hyponatremia: Secondary | ICD-10-CM | POA: Diagnosis present

## 2022-07-30 DIAGNOSIS — I10 Essential (primary) hypertension: Secondary | ICD-10-CM | POA: Diagnosis present

## 2022-07-30 DIAGNOSIS — Z886 Allergy status to analgesic agent status: Secondary | ICD-10-CM | POA: Diagnosis not present

## 2022-07-30 DIAGNOSIS — Z833 Family history of diabetes mellitus: Secondary | ICD-10-CM | POA: Diagnosis not present

## 2022-07-30 DIAGNOSIS — Z882 Allergy status to sulfonamides status: Secondary | ICD-10-CM | POA: Diagnosis not present

## 2022-07-30 DIAGNOSIS — E119 Type 2 diabetes mellitus without complications: Secondary | ICD-10-CM | POA: Diagnosis present

## 2022-07-30 DIAGNOSIS — D638 Anemia in other chronic diseases classified elsewhere: Secondary | ICD-10-CM | POA: Diagnosis present

## 2022-07-30 DIAGNOSIS — D5700 Hb-SS disease with crisis, unspecified: Secondary | ICD-10-CM | POA: Diagnosis present

## 2022-07-30 DIAGNOSIS — E860 Dehydration: Secondary | ICD-10-CM | POA: Diagnosis present

## 2022-07-30 LAB — CBC WITH DIFFERENTIAL/PLATELET
Abs Immature Granulocytes: 0.86 10*3/uL — ABNORMAL HIGH (ref 0.00–0.07)
Basophils Absolute: 0.2 10*3/uL — ABNORMAL HIGH (ref 0.0–0.1)
Basophils Relative: 1 %
Eosinophils Absolute: 0.2 10*3/uL (ref 0.0–0.5)
Eosinophils Relative: 1 %
HCT: 34.6 % — ABNORMAL LOW (ref 39.0–52.0)
Hemoglobin: 12.5 g/dL — ABNORMAL LOW (ref 13.0–17.0)
Immature Granulocytes: 3 %
Lymphocytes Relative: 18 %
Lymphs Abs: 4.7 10*3/uL — ABNORMAL HIGH (ref 0.7–4.0)
MCH: 29.4 pg (ref 26.0–34.0)
MCHC: 36.1 g/dL — ABNORMAL HIGH (ref 30.0–36.0)
MCV: 81.4 fL (ref 80.0–100.0)
Monocytes Absolute: 1.5 10*3/uL — ABNORMAL HIGH (ref 0.1–1.0)
Monocytes Relative: 6 %
Neutro Abs: 18.5 10*3/uL — ABNORMAL HIGH (ref 1.7–7.7)
Neutrophils Relative %: 71 %
Platelets: 347 10*3/uL (ref 150–400)
RBC: 4.25 MIL/uL (ref 4.22–5.81)
RDW: 14.8 % (ref 11.5–15.5)
WBC: 25.9 10*3/uL — ABNORMAL HIGH (ref 4.0–10.5)
nRBC: 1.9 % — ABNORMAL HIGH (ref 0.0–0.2)

## 2022-07-30 LAB — GLUCOSE, CAPILLARY
Glucose-Capillary: 202 mg/dL — ABNORMAL HIGH (ref 70–99)
Glucose-Capillary: 185 mg/dL — ABNORMAL HIGH (ref 70–99)
Glucose-Capillary: 193 mg/dL — ABNORMAL HIGH (ref 70–99)
Glucose-Capillary: 227 mg/dL — ABNORMAL HIGH (ref 70–99)

## 2022-07-30 LAB — COMPREHENSIVE METABOLIC PANEL
ALT: 26 U/L (ref 0–44)
AST: 36 U/L (ref 15–41)
Albumin: 4.4 g/dL (ref 3.5–5.0)
Alkaline Phosphatase: 94 U/L (ref 38–126)
Anion gap: 11 (ref 5–15)
BUN: 10 mg/dL (ref 6–20)
CO2: 23 mmol/L (ref 22–32)
Calcium: 9.7 mg/dL (ref 8.9–10.3)
Chloride: 100 mmol/L (ref 98–111)
Creatinine, Ser: 0.76 mg/dL (ref 0.61–1.24)
GFR, Estimated: >60 mL/min (ref >60)
Glucose, Bld: 212 mg/dL — ABNORMAL HIGH (ref 70–99)
Potassium: 4 mmol/L (ref 3.5–5.1)
Sodium: 134 mmol/L — ABNORMAL LOW (ref 135–145)
Total Bilirubin: 4.2 mg/dL — ABNORMAL HIGH (ref 0.3–1.2)
Total Protein: 8.6 g/dL — ABNORMAL HIGH (ref 6.5–8.1)

## 2022-07-30 LAB — TSH: TSH: 1.217 u[IU]/mL (ref 0.350–4.500)

## 2022-07-30 LAB — URINALYSIS, COMPLETE (UACMP) WITH MICROSCOPIC
Bacteria, UA: NONE SEEN
Bilirubin Urine: NEGATIVE
Glucose, UA: 50 mg/dL — AB
Hgb urine dipstick: NEGATIVE
Ketones, ur: NEGATIVE mg/dL
Leukocytes,Ua: NEGATIVE
Nitrite: NEGATIVE
Protein, ur: NEGATIVE mg/dL
Specific Gravity, Urine: 1.012 (ref 1.005–1.030)
pH: 5 (ref 5.0–8.0)

## 2022-07-30 LAB — TYPE AND SCREEN
ABO/RH(D): A POS
Antibody Screen: NEGATIVE

## 2022-07-30 LAB — RAPID URINE DRUG SCREEN, HOSP PERFORMED
Amphetamines: NOT DETECTED
Barbiturates: NOT DETECTED
Benzodiazepines: NOT DETECTED
Cocaine: NOT DETECTED
Opiates: NOT DETECTED
Tetrahydrocannabinol: NOT DETECTED

## 2022-07-30 LAB — CREATININE, URINE, RANDOM: Creatinine, Urine: 57 mg/dL

## 2022-07-30 LAB — RETICULOCYTES
Immature Retic Fract: 39.9 % — ABNORMAL HIGH (ref 2.3–15.9)
RBC.: 4.21 MIL/uL — ABNORMAL LOW (ref 4.22–5.81)
Retic Count, Absolute: 234.5 10*3/uL — ABNORMAL HIGH (ref 19.0–186.0)
Retic Ct Pct: 6.7 % — ABNORMAL HIGH (ref 0.4–3.1)

## 2022-07-30 LAB — OSMOLALITY: Osmolality: 291 mOsm/kg (ref 275–295)

## 2022-07-30 LAB — PROTIME-INR
INR: 1 (ref 0.8–1.2)
Prothrombin Time: 13.8 seconds (ref 11.4–15.2)

## 2022-07-30 LAB — SODIUM, URINE, RANDOM: Sodium, Ur: 68 mmol/L

## 2022-07-30 LAB — BRAIN NATRIURETIC PEPTIDE: B Natriuretic Peptide: 38.6 pg/mL (ref 0.0–100.0)

## 2022-07-30 LAB — HEMOGLOBIN A1C
Hgb A1c MFr Bld: 5.5 % (ref 4.8–5.6)
Mean Plasma Glucose: 111 mg/dL

## 2022-07-30 LAB — OSMOLALITY, URINE: Osmolality, Ur: 329 mOsm/kg (ref 300–900)

## 2022-07-30 LAB — PHOSPHORUS: Phosphorus: 4.5 mg/dL (ref 2.5–4.6)

## 2022-07-30 LAB — MAGNESIUM: Magnesium: 1.7 mg/dL (ref 1.7–2.4)

## 2022-07-30 LAB — ABO/RH: ABO/RH(D): A POS

## 2022-07-30 MED ORDER — OXYCODONE HCL 5 MG PO TABS: 10.0000 mg | ORAL_TABLET | ORAL | Status: DC | PRN

## 2022-07-30 MED ORDER — FOLIC ACID 1 MG PO TABS
1.0000 mg | ORAL_TABLET | Freq: Every day | ORAL | Status: DC
  Administered 2022-07-30 – 2022-07-31 (×2): 1 mg via ORAL
  Filled 2022-07-30 (×2): qty 1

## 2022-07-30 NOTE — Progress Notes (Signed)
Subjective: Gregory Bryant is a 47 year old male with a medical history significant for sickle cell cell disease, chronic pain syndrome, type 2 DM, hypertension, and anemia of chronic disease was admitted for sickle cell pain crisis.   Gregory Bryant is complaining of allover body pain. He rates pain at 8/10, he says that pain is improving with PCA.  He denies headache, shortness of breath, chest pain, nausea, vomiting, or diarrhea.   Objective:  Vital signs in last 24 hours:  Vitals:   07/30/22 0402 07/30/22 0642 07/30/22 0900 07/30/22 1049  BP:  (!) 162/94  (!) 148/97  Pulse:  90  89  Resp: 20 16 16 18   Temp:  98.5 F (36.9 C)  98.4 F (36.9 C)  TempSrc:  Oral  Oral  SpO2: 97% 97% 97% 96%  Weight:      Height:        Intake/Output from previous day:   Intake/Output Summary (Last 24 hours) at 07/30/2022 1141 Last data filed at 07/30/2022 7829 Gross per 24 hour  Intake 1241.25 ml  Output --  Net 1241.25 ml    Physical Exam: General: Alert, awake, oriented x3, in no acute distress.  HEENT: [place]/AT PEERL, EOMI Neck: Trachea midline,  no masses, no thyromegal,y no JVD, no carotid bruit OROPHARYNX:  Moist, No exudate/ erythema/lesions.  Heart: Regular rate and rhythm, without murmurs, rubs, gallops, PMI non-displaced, no heaves or thrills on palpation.  Lungs: Clear to auscultation, no wheezing or rhonchi noted. No increased vocal fremitus resonant to percussion  Abdomen: Soft, nontender, nondistended, positive bowel sounds, no masses no hepatosplenomegaly noted..  Neuro: No focal neurological deficits noted cranial nerves II through XII grossly intact. DTRs 2+ bilaterally upper and lower extremities. Strength 5 out of 5 in bilateral upper and lower extremities. Musculoskeletal: No warm swelling or erythema around joints, no spinal tenderness noted. Psychiatric: Patient alert and oriented x3, good insight and cognition, good recent to remote recall. Lymph node survey: No  cervical axillary or inguinal lymphadenopathy noted.  Lab Results:  Basic Metabolic Panel:    Component Value Date/Time   NA 134 (L) 07/30/2022 0053   NA 136 08/06/2021 1218   K 4.0 07/30/2022 0053   CL 100 07/30/2022 0053   CO2 23 07/30/2022 0053   BUN 10 07/30/2022 0053   BUN 9 08/06/2021 1218   CREATININE 0.76 07/30/2022 0053   GLUCOSE 212 (H) 07/30/2022 0053   CALCIUM 9.7 07/30/2022 0053   CBC:    Component Value Date/Time   WBC 25.9 (H) 07/30/2022 0053   HGB 12.5 (L) 07/30/2022 0053   HGB 13.2 05/13/2022 0919   HCT 34.6 (L) 07/30/2022 0053   HCT 39.7 05/13/2022 0919   PLT 347 07/30/2022 0053   PLT 444 05/13/2022 0919   MCV 81.4 07/30/2022 0053   MCV 86 05/13/2022 0919   NEUTROABS 18.5 (H) 07/30/2022 0053   NEUTROABS 10.1 (H) 05/13/2022 0919   LYMPHSABS 4.7 (H) 07/30/2022 0053   LYMPHSABS 4.2 (H) 05/13/2022 0919   MONOABS 1.5 (H) 07/30/2022 0053   EOSABS 0.2 07/30/2022 0053   EOSABS 0.9 (H) 05/13/2022 0919   BASOSABS 0.2 (H) 07/30/2022 0053   BASOSABS 0.2 05/13/2022 0919    Recent Results (from the past 240 hour(s))  SARS Coronavirus 2 by RT PCR (hospital order, performed in [facility] hospital lab) *cepheid single result test* Anterior Nasal Swab     Status: None   Collection Time: 07/29/22  8:21 PM   Specimen: Anterior Nasal Swab  Result Value Ref Range Status   SARS Coronavirus 2 by RT PCR NEGATIVE NEGATIVE Final    Comment: (NOTE) SARS-CoV-2 target nucleic acids are NOT DETECTED.  The SARS-CoV-2 RNA is generally detectable in upper and lower respiratory specimens during the acute phase of infection. The lowest concentration of SARS-CoV-2 viral copies this assay can detect is 250 copies / mL. A negative result does not preclude SARS-CoV-2 infection and should not be used as the sole basis for treatment or other patient management decisions.  A negative result may occur with improper specimen collection / handling, submission of specimen other than  nasopharyngeal swab, presence of viral mutation(s) within the areas targeted by this assay, and inadequate number of viral copies (<250 copies / mL). A negative result must be combined with clinical observations, patient history, and epidemiological information.  Fact Sheet for Patients:   RoadLapTop.co.za  Fact Sheet for Healthcare Providers: http://kim-miller.com/  This test is not yet approved or  cleared by the [place] FDA and has been authorized for detection and/or diagnosis of SARS-CoV-2 by FDA under an Emergency Use Authorization (EUA).  This EUA will remain in effect (meaning this test can be used) for the duration of the COVID-19 declaration under Section 564(b)(1) of the Act, 21 U.S.C. section 360bbb-3(b)(1), unless the authorization is terminated or revoked sooner.  Performed at [facility], [address] [place], [place] [address]     Studies/Results: CT Angio Chest PE W and/or Wo Contrast  Result Date: 07/29/2022 CLINICAL DATA:  Pulmonary embolism suspected, high probability. Sickle cell flare-up. Arm, back, and chest pain. EXAM: CT ANGIOGRAPHY CHEST WITH CONTRAST TECHNIQUE: Multidetector CT imaging of the chest was performed using the standard protocol during bolus administration of intravenous contrast. Multiplanar CT image reconstructions and MIPs were obtained to evaluate the vascular anatomy. RADIATION DOSE REDUCTION: This exam was performed according to the departmental dose-optimization program which includes automated exposure control, adjustment of the mA and/or kV according to patient size and/or use of iterative reconstruction technique. CONTRAST:  80mL OMNIPAQUE IOHEXOL 350 MG/ML SOLN COMPARISON:  03/23/2020 FINDINGS: Cardiovascular: . heart is normal in size and there is no pericardial effusion. The aorta and pulmonary trunk are normal in caliber. No evidence of pulmonary embolism.  Mediastinum/Nodes: No mediastinal or hilar lymphadenopathy. Nonspecific prominent lymph nodes are present in the axillary regions bilaterally. The thyroid gland, trachea, and esophagus are within normal limits. Lungs/Pleura: Lungs are clear. No pleural effusion or pneumothorax. Upper Abdomen: The spleen is small and calcified, compatible with history of sickle cell disease. The gallbladder is surgically absent. Mild renal cortical scarring is noted bilaterally. 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 Mild degenerative changes are present in the thoracic spine. Increased density is noted throughout the bones, compatible with history of sickle cell disease. No acute osseous abnormality. Review of the MIP images confirms the above findings. IMPRESSION: 1. No evidence of pulmonary embolism or other acute process. 2. Sequela of sickle cell disease. Electronically Signed   By: Thornell Sartorius M.D.   On: 07/29/2022 21:14   DG Chest Port 1 View  Result Date: 07/29/2022 CLINICAL DATA:  Sickle cell pain EXAM: PORTABLE CHEST 1 VIEW COMPARISON:  08/25/2020 FINDINGS: Heart and mediastinal contours are within normal limits. No focal opacities or effusions. No acute bony abnormality. Sclerosis within the humeral head cysts compatible with sickle cell. IMPRESSION: No active cardiopulmonary disease. Electronically Signed   By: Charlett Nose M.D.   On: 07/29/2022 18:46  Medications: Scheduled Meds:  docusate sodium  100 mg Oral BID   HYDROmorphone   Intravenous Q4H   insulin aspart  0-9 Units Subcutaneous TID WC   lisinopril  5 mg Oral Daily   Continuous Infusions:  sodium chloride 75 mL/hr at 07/30/22 0938   PRN Meds:.acetaminophen **OR** acetaminophen, diphenhydrAMINE, melatonin, naloxone **AND** sodium chloride flush, ondansetron (ZOFRAN) IV, polyethylene glycol  Consultants: None  Procedures: None  Antibiotics: None  Assessment/Plan: Principal Problem:   Sickle  cell pain crisis (HCC) Active Problems:   DM2 (diabetes mellitus, type 2) (HCC)   Acute hyponatremia   H/O leukocytosis   Sickle cell disease with pain crisis:  Patient admitted for sickle cell pain crisis.  Will continue IV Dilaudid PCA with settings of 0.5 mg, 10-minute lockout, and 3 mg/h. Continue IV fluids, 0.45% saline at 75 mL/h Also, resume patient's home oxycodone 10 mg every 4 hours as needed for severe breakthrough pain Toradol 15 mg IV every 6 hours Monitor vital signs very closely, reevaluate pain scale regularly, and supplemental oxygen as needed  Type 2 diabetes mellitus: Patient has fairly well-controlled diabetes.  Will continue SSI while in hospital.  Hyponatremia: More than likely secondary to dehydration.  Will continue to monitor closely.  Leukocytosis: Patient has chronic leukocytosis.  WBCs elevated above baseline, more than likely reactive.  Continue to monitor closely.  Anemia of chronic disease: Hemoglobin is stable and consistent with patient's baseline.  No clinical indication for blood transfusion.  Monitor closely.  Labs in AM.  Code Status: Full Code Family Communication: N/A Disposition Plan: Not yet ready for discharge  Nolon Nations  APRN, MSN, FNP-C [facility] [address] [place], [place] [address] [phone]  If 7PM-7AM, please contact night-coverage.  07/30/2022, 11:41 AM  LOS: 0 days

## 2022-07-31 ENCOUNTER — Encounter: Payer: Self-pay | Admitting: Family Medicine

## 2022-07-31 DIAGNOSIS — D5700 Hb-SS disease with crisis, unspecified: Secondary | ICD-10-CM | POA: Diagnosis not present

## 2022-07-31 LAB — BASIC METABOLIC PANEL
Anion gap: 10 (ref 5–15)
BUN: 12 mg/dL (ref 6–20)
CO2: 21 mmol/L — ABNORMAL LOW (ref 22–32)
Calcium: 9.5 mg/dL (ref 8.9–10.3)
Chloride: 101 mmol/L (ref 98–111)
Creatinine, Ser: 0.61 mg/dL (ref 0.61–1.24)
GFR, Estimated: >60 mL/min (ref >60)
Glucose, Bld: 207 mg/dL — ABNORMAL HIGH (ref 70–99)
Potassium: 4.3 mmol/L (ref 3.5–5.1)
Sodium: 132 mmol/L — ABNORMAL LOW (ref 135–145)

## 2022-07-31 LAB — GLUCOSE, CAPILLARY
Glucose-Capillary: 165 mg/dL — ABNORMAL HIGH (ref 70–99)
Glucose-Capillary: 202 mg/dL — ABNORMAL HIGH (ref 70–99)

## 2022-07-31 LAB — CBC
HCT: 36.9 % — ABNORMAL LOW (ref 39.0–52.0)
Hemoglobin: 13.1 g/dL (ref 13.0–17.0)
MCH: 29.2 pg (ref 26.0–34.0)
MCHC: 35.5 g/dL (ref 30.0–36.0)
MCV: 82.2 fL (ref 80.0–100.0)
Platelets: 312 10*3/uL (ref 150–400)
RBC: 4.49 MIL/uL (ref 4.22–5.81)
RDW: 14.9 % (ref 11.5–15.5)
WBC: 16.4 10*3/uL — ABNORMAL HIGH (ref 4.0–10.5)
nRBC: 4.2 % — ABNORMAL HIGH (ref 0.0–0.2)

## 2022-07-31 MED ORDER — OXYCODONE HCL 10 MG PO TABS
10.0000 mg | ORAL_TABLET | ORAL | 0 refills | Status: DC | PRN
Start: 2022-07-31 — End: 2022-12-23

## 2022-07-31 NOTE — Progress Notes (Signed)
Pt d/c'd before TOC completion; unable to complete assessment.

## 2022-07-31 NOTE — Discharge Summary (Signed)
Physician Discharge Summary  Gregory Bryant ZOX:[id number] DOB: Jun 04, 1975 DOA: 07/29/2022  PCP: Massie Maroon, FNP  Admit date: 07/29/2022  Discharge date: 07/31/2022  Discharge Diagnoses:  Principal Problem:   Sickle cell pain crisis (HCC) Active Problems:   DM2 (diabetes mellitus, type 2) (HCC)   Acute hyponatremia   H/O leukocytosis   Discharge Condition: Stable  Disposition:  Pt is discharged home in good condition and is to follow up with Massie Maroon, FNP this week to have labs evaluated. Gregory Bryant is instructed to increase activity slowly and balance with rest for the next few days, and use prescribed medication to complete treatment of pain  Diet: Regular Wt Readings from Last 3 Encounters:  07/29/22 72.8 kg  07/11/22 73.5 kg  06/22/22 74.8 kg    History of present illness:  Gregory Bryant is a 47 year old male with a medical history significant for sickle cell disease with prior hospitalizations for sickle cell pain crisis, chronic leukocytosis with baseline white blood cell count 13-27, type 2 diabetes mellitus, who was admitted to [facility] on 07/29/2022 with acute sickle cell pain crisis after presenting from home to [facility] emergency department with complaints of generalized pain. In this patient with a documented history of sickle cell disease, the patient reports 2 days of sharp discomfort involving bilateral upper extremities, back, and chest.  In distribution, quality, and intensity that is consistent with pain experienced at time of their prior acute sickle cell pain crisis.  He notes that the chest pain is also similar to that which he has experienced at time of previous sickle cell pain crisis, and notes that the chest pain is nonexertional and nonpleuritic.  He reports mild shortness of breath in the absence of any associated orthopnea, PND, or worsening peripheral edema.  No recent preceding  trauma.  No recent headache, neck stiffness, rash, cough, abdominal pain, dysuria, or gross hematuria. Denies any associated palpitations, dizziness, nausea, vomiting, diarrhea, dizziness, presyncope, or syncope.  Also, denies any recent subjective fever, chills, rigors, or generalized myalgias. Denies any routine or recent alcohol consumption and denies any history of recreational drug use. The patient reports poor pain control at home despite use of as needed oxycodone 10 mg every 4 hours as needed, prompting him to present to the [facility] emergency department today for further evaluation and management thereof. He notes that he has been spending time in the warm July ambient temperatures, and acknowledges that there has not been compensatory increase in water in the setting.  No recent known sick contacts. Per chart, the patient has documented history of chronic leukocytosis with baseline white blood cells ranging between 13-27 dating back to at least May 2010.  ED course: Vital signs in the ED were notable for the following: Afebrile; heart rates in the 80s to low 100s; systolic blood pressure in the 140s to 150s; respiratory rate 18-22, oxygen saturation 94 to 100% on room air. Labs were notable for the following: CMP notable for sodium 131, corrected to approximately 132 when taken into account degree of presenting glucose level relative to most recent prior sodium level data point of 136 in July 2023, creatinine 0.82 compared to 0.81 in July 2023, glucose 159, total bilirubin 4.8, otherwise liver enzymes within normal limits.  High-sensitivity troponin 1 x 1 was found to be 16.  CBC notable for white blood cell count 28,300 with 75% neutrophils, hemoglobin 13.2, unchanged from most recent prior hemoglobin data point on 05/13/2022,  platelet count 387,000.  Reticulocyte count 6.7 compared to 7.1% in July 2023.  COVID-19 PCR negative. For interpretation, EKG in ED demonstrated the following: Sinus  rhythm with heart rate 95, normal intervals, no evidence of T wave or ST changes, including no evidence of ST elevation. Imaging in the ED, per corresponding formal radiology read was notable for the following: CTA of the chest with pulmonary embolism protocol showed no evidence of acute cardiopulmonary process, including no evidence of acute pulmonary embolism, no evidence of infiltrate, edema, effusion, or pneumothorax. While in the ED, the following were administrated: Dilaudid 0.5 mg IV x 3 doses, oxycodone 10 mg p.o. x 1 dose  In the setting of poorly controlled pain following at least 3 doses of IV analgesics in the ED, the patient is subsequently being admitted for further evaluation/management of presenting acute sickle cell pain crisis, including emphasis on pain control.  Subsequently, the patient was admitted for further evaluation Hospital Course:   Sickle cell pain crisis:  Patient was admitted for sickle cell pain crisis and managed appropriately with IVF, IV Dilaudid Via PCA and IV Toradol, as well as other adjunct therapies per sickle cell pain management protocols.  PCA was discontinued and patient was transition to his home medication of oxycodone 10 mg every 4 hours as needed for severe breakthrough pain. Patient will follow-up on 08/12/2022 for medication management.  Discussed medication changes at length. Also, discussed the importance of hydrating with 32 to 64 ounces of water per day.  During summer months, it is very important to add additional hydration to current regimen.  Type 2 diabetes mellitus: Patient is followed in primary care every 3 months for his type 2 diabetes mellitus.  Patient's fructosamine levels have been stable.  Patient will continue to follow-up in primary care.  He will resume all prescribed home pain medications.  Patient is aware of upcoming appointment.  Oxycodone 10 mg every 4 hours #60 has been sent to patient's pharmacy.  Reviewed PDMP, no  inconsistencies noted.   Patient was therefore discharged home today in a hemodynamically stable condition.   Kemp will follow-up with PCP within 1 week of this discharge. Alejos was counseled extensively about nonpharmacologic means of pain management, patient verbalized understanding and was appreciative of  the care received during this admission.   We discussed the need for good hydration, monitoring of hydration status, avoidance of heat, cold, stress, and infection triggers. We discussed the need to be adherent with taking Hydrea and other home medications. Patient was reminded of the need to seek medical attention immediately if any symptom of bleeding, anemia, or infection occurs.  Discharge Exam: Vitals:   07/31/22 0702 07/31/22 0939  BP: 130/88 128/80  Pulse: 81 87  Resp: 18 18  Temp: 98.5 F (36.9 C) 98.4 F (36.9 C)  SpO2: 97% 96%   Vitals:   07/31/22 0241 07/31/22 0420 07/31/22 0702 07/31/22 0939  BP: 115/81  130/88 128/80  Pulse: 89  81 87  Resp: 18 18 18 18   Temp: 98.6 F (37 C)  98.5 F (36.9 C) 98.4 F (36.9 C)  TempSrc: Oral  Oral Oral  SpO2: 96% 96% 97% 96%  Weight:      Height:        General appearance : Awake, alert, not in any distress. Speech Clear. Not toxic looking HEENT: Atraumatic and Normocephalic, pupils equally reactive to light and accomodation Neck: Supple, no JVD. No cervical lymphadenopathy.  Chest: Good air entry bilaterally, no added  sounds  CVS: S1 S2 regular, no murmurs.  Abdomen: Bowel sounds present, Non tender and not distended with no gaurding, rigidity or rebound. Extremities: B/L Lower Ext shows no edema, both legs are warm to touch Neurology: Awake alert, and oriented X 3, CN II-XII intact, Non focal Skin: No Rash  Discharge Instructions  Discharge Instructions     Discharge patient   Complete by: As directed    Discharge disposition: 01-Home or Self Care   Discharge patient date: 07/31/2022      Allergies as of  07/31/2022       Reactions   Aspirin Other (See Comments)   unknown   Sulfa Antibiotics Other (See Comments)   unknown        Medication List     TAKE these medications    Blood Glucose Monitoring Suppl Devi 1 each by Does not apply route 2 (two) times daily. May substitute to any manufacturer covered by patient's insurance.   diclofenac 75 MG EC tablet Commonly known as: VOLTAREN Take 1 tablet (75 mg total) by mouth 2 (two) times daily.   folic acid 1 MG tablet Commonly known as: FOLVITE Take 1 tablet (1 mg total) by mouth daily.   glipiZIDE 10 MG tablet Commonly known as: GLUCOTROL Take 1 tablet (10 mg total) by mouth daily before breakfast.   ibuprofen 600 MG tablet Commonly known as: ADVIL Take 1 tablet (600 mg total) by mouth every 8 (eight) hours as needed.   lisinopril 5 MG tablet Commonly known as: ZESTRIL Take 1 tablet (5 mg total) by mouth daily.   metFORMIN 1000 MG tablet Commonly known as: GLUCOPHAGE Take 1 tablet (1,000 mg total) by mouth 2 (two) times daily with a meal.   mupirocin ointment 2 % Commonly known as: BACTROBAN Apply 1 Application topically 2 (two) times daily.   ondansetron 8 MG disintegrating tablet Commonly known as: Zofran ODT Take 1 tablet (8 mg total) by mouth every 8 (eight) hours as needed for nausea or vomiting.   Oxycodone HCl 10 MG Tabs Take 1 tablet (10 mg total) by mouth every 4 (four) hours as needed.   Trulicity 0.75 MG/0.5ML Sopn Generic drug: Dulaglutide Inject 0.75 mg into the skin once a week.   Vitamin D (Ergocalciferol) 1.25 MG (50000 UNIT) Caps capsule Commonly known as: DRISDOL Take 1 capsule (50,000 Units total) by mouth every 7 (seven) days. Mondays What changed:  when to take this additional instructions        The results of significant diagnostics from this hospitalization (including imaging, microbiology, ancillary and laboratory) are listed below for reference.    Significant Diagnostic  Studies: CT Angio Chest PE W and/or Wo Contrast  Result Date: 07/29/2022 CLINICAL DATA:  Pulmonary embolism suspected, high probability. Sickle cell flare-up. Arm, back, and chest pain. EXAM: CT ANGIOGRAPHY CHEST WITH CONTRAST TECHNIQUE: Multidetector CT imaging of the chest was performed using the standard protocol during bolus administration of intravenous contrast. Multiplanar CT image reconstructions and MIPs were obtained to evaluate the vascular anatomy. RADIATION DOSE REDUCTION: This exam was performed according to the departmental dose-optimization program which includes automated exposure control, adjustment of the mA and/or kV according to patient size and/or use of iterative reconstruction technique. CONTRAST:  80mL OMNIPAQUE IOHEXOL 350 MG/ML SOLN COMPARISON:  03/23/2020 FINDINGS: Cardiovascular: . heart is normal in size and there is no pericardial effusion. The aorta and pulmonary trunk are normal in caliber. No evidence of pulmonary embolism. Mediastinum/Nodes: No mediastinal or hilar lymphadenopathy. Nonspecific  prominent lymph nodes are present in the axillary regions bilaterally. The thyroid gland, trachea, and esophagus are within normal limits. Lungs/Pleura: Lungs are clear. No pleural effusion or pneumothorax. Upper Abdomen: The spleen is small and calcified, compatible with history of sickle cell disease. The gallbladder is surgically absent. Mild renal cortical scarring is noted bilaterally. 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 Mild degenerative changes are present in the thoracic spine. Increased density is noted throughout the bones, compatible with history of sickle cell disease. No acute osseous abnormality. Review of the MIP images confirms the above findings. IMPRESSION: 1. No evidence of pulmonary embolism or other acute process. 2. Sequela of sickle cell disease. Electronically Signed   By: Thornell Sartorius M.D.   On: 07/29/2022 21:14   DG  Chest Port 1 View  Result Date: 07/29/2022 CLINICAL DATA:  Sickle cell pain EXAM: PORTABLE CHEST 1 VIEW COMPARISON:  08/25/2020 FINDINGS: Heart and mediastinal contours are within normal limits. No focal opacities or effusions. No acute bony abnormality. Sclerosis within the humeral head cysts compatible with sickle cell. IMPRESSION: No active cardiopulmonary disease. Electronically Signed   By: Charlett Nose M.D.   On: 07/29/2022 18:46   DG Foot Complete Left  Result Date: 07/25/2022 Please see detailed radiograph report in office note.   Microbiology: Recent Results (from the past 240 hour(s))  SARS Coronavirus 2 by RT PCR (hospital order, performed in [facility] hospital lab) *cepheid single result test* Anterior Nasal Swab     Status: None   Collection Time: 07/29/22  8:21 PM   Specimen: Anterior Nasal Swab  Result Value Ref Range Status   SARS Coronavirus 2 by RT PCR NEGATIVE NEGATIVE Final    Comment: (NOTE) SARS-CoV-2 target nucleic acids are NOT DETECTED.  The SARS-CoV-2 RNA is generally detectable in upper and lower respiratory specimens during the acute phase of infection. The lowest concentration of SARS-CoV-2 viral copies this assay can detect is 250 copies / mL. A negative result does not preclude SARS-CoV-2 infection and should not be used as the sole basis for treatment or other patient management decisions.  A negative result may occur with improper specimen collection / handling, submission of specimen other than nasopharyngeal swab, presence of viral mutation(s) within the areas targeted by this assay, and inadequate number of viral copies (<250 copies / mL). A negative result must be combined with clinical observations, patient history, and epidemiological information.  Fact Sheet for Patients:   RoadLapTop.co.za  Fact Sheet for Healthcare Providers: http://kim-miller.com/  This test is not yet approved or  cleared by  the [place] FDA and has been authorized for detection and/or diagnosis of SARS-CoV-2 by FDA under an Emergency Use Authorization (EUA).  This EUA will remain in effect (meaning this test can be used) for the duration of the COVID-19 declaration under Section 564(b)(1) of the Act, 21 U.S.C. section 360bbb-3(b)(1), unless the authorization is terminated or revoked sooner.  Performed at [facility], [address] [place], [place] [address]      Labs: Basic Metabolic Panel: Recent Labs  Lab 07/29/22 1846 07/30/22 0053  NA 131* 134*  K 3.5 4.0  CL 102 100  CO2 19* 23  GLUCOSE 159* 212*  BUN 12 10  CREATININE 0.84 0.76  CALCIUM 9.8 9.7  MG  --  1.7  PHOS  --  4.5   Liver Function Tests: Recent Labs  Lab 07/29/22 1846 07/30/22 0053  AST 39 36  ALT 29  26  ALKPHOS 103 94  BILITOT 4.6* 4.2*  PROT 9.6* 8.6*  ALBUMIN 5.0 4.4   No results for input(s): "LIPASE", "AMYLASE" in the last 168 hours. No results for input(s): "AMMONIA" in the last 168 hours. CBC: Recent Labs  Lab 07/29/22 1846 07/30/22 0053  WBC 28.3* 25.9*  NEUTROABS 21.3* 18.5*  HGB 13.2 12.5*  HCT 36.8* 34.6*  MCV 81.6 81.4  PLT 387 347   Cardiac Enzymes: No results for input(s): "CKTOTAL", "CKMB", "CKMBINDEX", "TROPONINI" in the last 168 hours. BNP: Invalid input(s): "POCBNP" CBG: Recent Labs  Lab 07/30/22 1126 07/30/22 1639 07/30/22 2119 07/31/22 0747 07/31/22 1200  GLUCAP 185* 193* 227* 165* 202*    Time coordinating discharge: 30 minutes  Signed:   Nolon Nations  APRN, MSN, FNP-C Patient Care [facility] [address] [place], [place] [address] [phone]  [facility] 07/31/2022, 12:13 PM

## 2022-08-04 ENCOUNTER — Telehealth: Payer: Self-pay

## 2022-08-04 NOTE — Transitions of Care (Post Inpatient/ED Visit) (Signed)
08/04/2022  Name: Gregory Bryant MRN: [id number] DOB: [birth year]  Today's TOC FU Call Status: Today's TOC FU Call Status:: Successful TOC FU Call Competed TOC FU Call Complete Date: 08/04/22  Transition Care Management Follow-up Telephone Call Date of Discharge: 07/31/22 Discharge Facility: [facility]) Type of Discharge: Inpatient Admission Primary Inpatient Discharge Diagnosis:: Sickle cell pain crisis How have you been since you were released from the hospital?: Better Any questions or concerns?: No  Items Reviewed: Did you receive and understand the discharge instructions provided?: Yes Medications obtained,verified, and reconciled?: Yes (Medications Reviewed) Any new allergies since your discharge?: No Dietary orders reviewed?: Yes Do you have support at home?: Yes  Medications Reviewed Today: Medications Reviewed Today     Reviewed by Merleen Nicely, LPN (Licensed Practical Nurse) on 08/04/22 at 1141  Med List Status: <None>   Medication Order Taking? Sig Documenting Provider Last Dose Status Informant  Blood Glucose Monitoring Suppl DEVI [id number]  1 each by Does not apply route 2 (two) times daily. May substitute to any manufacturer covered by patient's insurance. Massie Maroon, FNP  Active Self, Pharmacy Records   Patient not taking:   Discontinued 11/30/19 1446 diclofenac (VOLTAREN) 75 MG EC tablet [id number]  Take 1 tablet (75 mg total) by mouth 2 (two) times daily.  Patient not taking: Reported on 07/11/2022   Lenn Sink, DPM  Active Self, Pharmacy Records  Dulaglutide (TRULICITY) 0.75 MG/0.5ML [facility] [id number]  Inject 0.75 mg into the skin once a week.  Patient not taking: Reported on 07/11/2022   Massie Maroon, FNP  Active Self, Pharmacy Records   Patient not taking:   Discontinued 11/30/19 1446 folic acid (FOLVITE) 1 MG tablet [id number] Yes Take 1 tablet (1 mg total) by mouth daily. Massie Maroon, FNP Taking Active Self, Pharmacy  Records  glipiZIDE (GLUCOTROL) 10 MG tablet [id number] Yes Take 1 tablet (10 mg total) by mouth daily before breakfast. Massie Maroon, FNP Taking Active Self, Pharmacy Records  ibuprofen (ADVIL) 600 MG tablet [id number] Yes Take 1 tablet (600 mg total) by mouth every 8 (eight) hours as needed. Massie Maroon, FNP Taking Active Self, Pharmacy Records           Med Note Randa Evens, [place] D   Wed Jul 30, 2022 11:45 AM)    lisinopril (ZESTRIL) 5 MG tablet [id number] Yes Take 1 tablet (5 mg total) by mouth daily. Massie Maroon, FNP Taking Active Self, Pharmacy Records  metFORMIN (GLUCOPHAGE) 1000 MG tablet [id number] Yes Take 1 tablet (1,000 mg total) by mouth 2 (two) times daily with a meal. Massie Maroon, FNP Taking Active Self, Pharmacy Records  mupirocin ointment (BACTROBAN) 2 % [id number] Yes Apply 1 Application topically 2 (two) times daily. Ivonne Andrew, NP Taking Active Self, Pharmacy Records  ondansetron (ZOFRAN ODT) 8 MG disintegrating tablet [id number] Yes Take 1 tablet (8 mg total) by mouth every 8 (eight) hours as needed for nausea or vomiting. Massie Maroon, FNP Taking Active Self, Pharmacy Records           Med Note Randa Evens, [place] D   Wed Jul 30, 2022 11:45 AM)    Oxycodone HCl 10 MG TABS [id number] Yes Take 1 tablet (10 mg total) by mouth every 4 (four) hours as needed. Massie Maroon, FNP Taking Active   Vitamin D, Ergocalciferol, (DRISDOL) 1.25 MG (50000 UNIT) CAPS capsule [id number] Yes Take 1 capsule (50,000 Units total) by mouth every 7 (seven) days. Mondays  Patient taking differently: Take 50,000 Units by mouth every 30 (thirty) days.   Massie Maroon, FNP Taking Active Self, Pharmacy Records            Home Care and Equipment/Supplies: Were Home Health Services Ordered?: No Any new equipment or medical supplies ordered?: No  Functional Questionnaire: Do you need assistance with bathing/showering or dressing?: No Do you need assistance with meal  preparation?: No Do you need assistance with eating?: No Do you have difficulty maintaining continence: No Do you need assistance with getting out of bed/getting out of a chair/moving?: No Do you have difficulty managing or taking your medications?: No  Follow up appointments reviewed: PCP Follow-up appointment confirmed?: Yes Date of PCP follow-up appointment?: 08/12/22 Follow-up Provider: Julianne Handler FNP Specialist [facility] Follow-up appointment confirmed?: No Do you need transportation to your follow-up appointment?: No Do you understand care options if your condition(s) worsen?: Yes-patient verbalized understanding    SIGNATURE  Woodfin Ganja LPN [facility] Nurse Health Advisor Direct Dial [phone]

## 2022-08-06 ENCOUNTER — Other Ambulatory Visit: Payer: Self-pay | Admitting: Family Medicine

## 2022-08-06 DIAGNOSIS — E119 Type 2 diabetes mellitus without complications: Secondary | ICD-10-CM

## 2022-08-12 ENCOUNTER — Encounter: Payer: Self-pay | Admitting: Family Medicine

## 2022-08-12 ENCOUNTER — Ambulatory Visit (INDEPENDENT_AMBULATORY_CARE_PROVIDER_SITE_OTHER): Payer: Medicaid Other | Admitting: Family Medicine

## 2022-08-12 VITALS — BP 147/83 | HR 65 | Temp 97.2°F | Wt 166.8 lb

## 2022-08-12 DIAGNOSIS — I10 Essential (primary) hypertension: Secondary | ICD-10-CM | POA: Diagnosis not present

## 2022-08-12 DIAGNOSIS — D5700 Hb-SS disease with crisis, unspecified: Secondary | ICD-10-CM

## 2022-08-12 DIAGNOSIS — E119 Type 2 diabetes mellitus without complications: Secondary | ICD-10-CM

## 2022-08-12 DIAGNOSIS — E559 Vitamin D deficiency, unspecified: Secondary | ICD-10-CM

## 2022-08-12 DIAGNOSIS — Z09 Encounter for follow-up examination after completed treatment for conditions other than malignant neoplasm: Secondary | ICD-10-CM

## 2022-08-12 NOTE — Progress Notes (Signed)
Established Patient Office Visit  Subjective   Patient ID: Gregory Bryant, male    DOB: 11-12-1975  Age: 47 y.o. MRN: [id number]  Chief Complaint  Patient presents with   Sickle Cell Anemia    Follow up    Gregory Bryant is a 47 year old male with a medical history significant for sickle cell disease, type 2 diabetes mellitus, essential hypertension, hyperlipidemia, chronic pain syndrome, opiate tolerance, and anemia of chronic disease presents accompanied by his wife for a post hospital follow-up. Patient was recently hospitalized for sickle cell pain crisis.  Patient suddenly developed pain that was unrelieved with his home medications.  Patient presented to the emergency department.  While in the hospital, he was treated with IV Dilaudid PCA, IV fluids, and Toradol.  Throughout admission, patient's pain intensity decreased and he was able to discharge home in a hemodynamically stable condition.  Patient has been able to manage his chronic pain at home on oxycodone 10 mg around every 6 hours as needed.  Currently, he denies any headache, blurry vision, chest pain, urinary symptoms, nausea, vomiting, or diarrhea.    Patient Active Problem List   Diagnosis Date Noted   Acute hyponatremia 07/29/2022   H/O leukocytosis 07/29/2022   Laceration of left ring finger without foreign body without damage to nail 07/11/2022   Need for immunization against influenza 01/15/2022   Screening for colon cancer 01/15/2022   Encounter for examination following treatment at hospital 01/15/2022   Vitamin D deficiency 10/28/2021   COVID 08/25/2020   Uncontrolled type 2 diabetes mellitus with hyperglycemia (HCC) 08/15/2019   Constipation due to pain medication 07/14/2019   Sickle cell pain crisis (HCC) 01/11/2018   Sickle cell anemia with pain (HCC) 07/22/2017   Chronic musculoskeletal pain 03/30/2017   Avascular necrosis (HCC) 03/30/2017   Asthma, mild intermittent 03/30/2017   DM2  (diabetes mellitus, type 2) (HCC) 02/07/2017   Functional asplenia 02/07/2017   Sickle cell disease, type [place] (HCC) 02/07/2017   Past Medical History:  Diagnosis Date   Diabetes mellitus without complication (HCC)    Sickle cell anemia (HCC)    Past Surgical History:  Procedure Laterality Date   CHOLECYSTECTOMY     Social History   Tobacco Use   Smoking status: Never   Smokeless tobacco: Never  Vaping Use   Vaping status: Never Used  Substance Use Topics   Alcohol use: No   Drug use: No   Social History   Socioeconomic History   Marital status: Legally Separated    Spouse name: Not on file   Number of children: Not on file   Years of education: Not on file   Highest education level: Not on file  Occupational History   Not on file  Tobacco Use   Smoking status: Never   Smokeless tobacco: Never  Vaping Use   Vaping status: Never Used  Substance and Sexual Activity   Alcohol use: No   Drug use: No   Sexual activity: Not Currently  Other Topics Concern   Not on file  Social History Narrative   ** Merged History Encounter **       Social Determinants of Health   Financial Resource Strain: Medium Risk (07/09/2022)   Overall Financial Resource Strain (CARDIA)    Difficulty of Paying Living Expenses: Somewhat hard  Food Insecurity: Food Insecurity Present (07/29/2022)   Hunger Vital Sign    Worried About Running Out of Food in the Last Year: Sometimes true    Ran  Out of Food in the Last Year: Sometimes true  Transportation Needs: No Transportation Needs (07/29/2022)   PRAPARE - Administrator, Civil Service (Medical): No    Lack of Transportation (Non-Medical): No  Physical Activity: Sufficiently Active (07/09/2022)   Exercise Vital Sign    Days of Exercise per Week: 7 days    Minutes of Exercise per Session: 150+ min  Stress: No Stress Concern Present (07/09/2022)   [employer] of Occupational Health - Occupational Stress Questionnaire    Feeling  of Stress : Only a little  Social Connections: Moderately Integrated (07/09/2022)   Social Connection and Isolation Panel [NHANES]    Frequency of Communication with Friends and Family: More than three times a week    Frequency of Social Gatherings with Friends and Family: Once a week    Attends Religious Services: More than 4 times per year    Active Member of [employer] or Organizations: Yes    Attends Banker Meetings: 1 to 4 times per year    Marital Status: Separated  Intimate Partner Violence: Not At Risk (07/29/2022)   Humiliation, Afraid, Rape, and Kick questionnaire    Fear of Current or Ex-Partner: No    Emotionally Abused: No    Physically Abused: No    Sexually Abused: No   Family Status  Relation Name Status   Mother  Alive   Father  Deceased  No partnership data on file   Family History  Problem Relation Age of Onset   Diabetes Mother    Allergies  Allergen Reactions   Aspirin Other (See Comments)    unknown   Sulfa Antibiotics Other (See Comments)    unknown      Review of Systems  Constitutional: Negative.   HENT: Negative.    Eyes: Negative.   Respiratory: Negative.    Cardiovascular: Negative.   Gastrointestinal: Negative.   Genitourinary: Negative.   Musculoskeletal:  Positive for back pain and joint pain.  Skin: Negative.   Neurological: Negative.   Endo/Heme/Allergies: Negative.   Psychiatric/Behavioral: Negative.        Objective:     BP (!) 147/83   Pulse 65   Temp (!) 97.2 F (36.2 C)   Wt 166 lb 12.8 oz (75.7 kg)   SpO2 98%   BMI 26.92 kg/m  BP Readings from Last 3 Encounters:  08/12/22 (!) 147/83  07/31/22 128/80  07/11/22 135/69   Wt Readings from Last 3 Encounters:  08/12/22 166 lb 12.8 oz (75.7 kg)  07/29/22 160 lb 7.9 oz (72.8 kg)  07/11/22 162 lb (73.5 kg)      Physical Exam Eyes:     Pupils: Pupils are equal, round, and reactive to light.  Cardiovascular:     Rate and Rhythm: Normal rate and regular  rhythm.     Pulses: Normal pulses.  Pulmonary:     Effort: Pulmonary effort is normal.  Abdominal:     General: Bowel sounds are normal.  Musculoskeletal:        General: Normal range of motion.  Skin:    General: Skin is warm.  Neurological:     General: No focal deficit present.     Mental Status: Mental status is at baseline.  Psychiatric:        Mood and Affect: Mood normal.        Behavior: Behavior normal.        Thought Content: Thought content normal.  Judgment: Judgment normal.      Results for orders placed or performed in visit on 08/12/22  Fructosamine  Result Value Ref Range   Fructosamine 398 (H) 0 - 285 umol/L  Sickle Cell Panel  Result Value Ref Range   Glucose 111 (H) 70 - 99 mg/dL   BUN 7 6 - 24 mg/dL   Creatinine, Ser 1.61 0.76 - 1.27 mg/dL   eGFR 096 >04 VW/UJW/1.19   BUN/Creatinine Ratio 9 9 - 20   Sodium 139 134 - 144 mmol/L   Potassium 3.8 3.5 - 5.2 mmol/L   Chloride 105 96 - 106 mmol/L   CO2 16 (L) 20 - 29 mmol/L   Calcium 9.7 8.7 - 10.2 mg/dL   Total Protein 7.1 6.0 - 8.5 g/dL   Albumin 4.2 4.1 - 5.1 g/dL   Globulin, Total 2.9 1.5 - 4.5 g/dL   Bilirubin Total 2.5 (H) 0.0 - 1.2 mg/dL   Alkaline Phosphatase 101 44 - 121 IU/L   AST 18 0 - 40 IU/L   ALT 17 0 - 44 IU/L   Ferritin 591 (H) 30 - 400 ng/mL   Vit D, 25-Hydroxy 34.0 30.0 - 100.0 ng/mL   WBC 16.5 (H) 3.4 - 10.8 x10E3/uL   RBC 4.06 (L) 4.14 - 5.80 x10E6/uL   Hemoglobin 11.7 (L) 13.0 - 17.7 g/dL   Hematocrit 14.7 (L) 82.9 - 51.0 %   MCV 89 79 - 97 fL   MCH 28.8 26.6 - 33.0 pg   MCHC 32.3 31.5 - 35.7 g/dL   RDW 56.2 (H) 13.0 - 86.5 %   Platelets 441 150 - 450 x10E3/uL   Neutrophils 49 Not Estab. %   Lymphs 33 Not Estab. %   Monocytes 5 Not Estab. %   Eos 10 Not Estab. %   Basos 1 Not Estab. %   Neutrophils Absolute 8.1 (H) 1.4 - 7.0 x10E3/uL   Lymphocytes Absolute 5.5 (H) 0.7 - 3.1 x10E3/uL   Monocytes Absolute 0.8 0.1 - 0.9 x10E3/uL   EOS (ABSOLUTE) 1.7 (H) 0.0 - 0.4  x10E3/uL   Basophils Absolute 0.2 0.0 - 0.2 x10E3/uL   Immature Granulocytes 2 Not Estab. %   Immature Grans (Abs) 0.3 (H) 0.0 - 0.1 x10E3/uL   NRBC 3 (H) 0 - 0 %   Hematology Comments: Note:    Retic Ct Pct 8.8 (H) 0.6 - 2.6 %  [id number] 11+Oxyco+Alc+Crt-Bund  Result Value Ref Range   Ethanol Negative Cutoff=0.020 %   Amphetamines, Urine Negative Cutoff=1000 ng/mL   Barbiturate Negative Cutoff=200 ng/mL   BENZODIAZ UR QL Negative Cutoff=200 ng/mL   Cannabinoid Quant, Ur Negative Cutoff=50 ng/mL   Cocaine (Metabolite) Negative Cutoff=300 ng/mL   OPIATE SCREEN URINE Negative Cutoff=300 ng/mL   Oxycodone/Oxymorphone, Urine Negative Cutoff=300 ng/mL   Phencyclidine Negative Cutoff=25 ng/mL   Methadone Screen, Urine Negative Cutoff=300 ng/mL   Propoxyphene Negative Cutoff=300 ng/mL   Meperidine Negative Cutoff=200 ng/mL   Tramadol Negative Cutoff=200 ng/mL   Creatinine 66.4 20.0 - 300.0 mg/dL   pH, Urine 5.1 4.5 - 8.9    Last CBC Lab Results  Component Value Date   WBC 16.5 (H) 08/12/2022   HGB 11.7 (L) 08/12/2022   HCT 36.2 (L) 08/12/2022   MCV 89 08/12/2022   MCH 28.8 08/12/2022   RDW 16.7 (H) 08/12/2022   PLT 441 08/12/2022   Last metabolic panel Lab Results  Component Value Date   GLUCOSE 111 (H) 08/12/2022   NA 139 08/12/2022   K 3.8 08/12/2022  CL 105 08/12/2022   CO2 16 (L) 08/12/2022   BUN 7 08/12/2022   CREATININE 0.79 08/12/2022   GFRNONAA >60 07/31/2022   CALCIUM 9.7 08/12/2022   PHOS 4.5 07/30/2022   PROT 7.1 08/12/2022   ALBUMIN 4.2 08/12/2022   LABGLOB 2.9 08/12/2022   AGRATIO 1.4 08/06/2021   BILITOT 2.5 (H) 08/12/2022   ALKPHOS 101 08/12/2022   AST 18 08/12/2022   ALT 17 08/12/2022   ANIONGAP 10 07/31/2022   Last lipids No results found for: "CHOL", "HDL", "LDLCALC", "LDLDIRECT", "TRIG", "CHOLHDL" Last hemoglobin A1c Lab Results  Component Value Date   HGBA1C 5.5 07/30/2022   Last thyroid functions Lab Results  Component Value Date   TSH  1.217 07/30/2022   Last vitamin D Lab Results  Component Value Date   VD25OH 34.0 08/12/2022   Last vitamin B12 and Folate No results found for: "VITAMINB12", "FOLATE"    The ASCVD Risk score (Arnett DK, et al., 2019) failed to calculate for the following reasons:   Cannot find a previous HDL lab   Cannot find a previous total cholesterol lab    Assessment & Plan:   Problem List Items Addressed This Visit       Endocrine   DM2 (diabetes mellitus, type 2) (HCC)   Relevant Orders   Fructosamine (Completed)     Other   Vitamin D deficiency   Relevant Orders   Sickle Cell Panel (Completed)   Sickle cell anemia with pain (HCC)   Relevant Orders   Sickle Cell Panel (Completed)   [id number] 11+Oxyco+Alc+Crt-Bund (Completed)   Other Visit Diagnoses     Essential hypertension    -  Primary   Hospital discharge follow-up         1. Essential hypertension BP (!) 147/83   Pulse 65   Temp (!) 97.2 F (36.2 C)   Wt 166 lb 12.8 oz (75.7 kg)   SpO2 98%   BMI 26.92 kg/m  - Continue medication, monitor blood pressure at home. Continue DASH diet.  Reminder to go to the ER if any CP, SOB, nausea, dizziness, severe HA, changes vision/speech, left arm numbness and tingling and jaw pain.    2. Type 2 diabetes mellitus without complication, without long-term current use of insulin (HCC)  - Fructosamine  3. Vitamin D deficiency  - Sickle Cell Panel  4. Sickle cell anemia with pain (HCC)  - Sickle Cell Panel - [id number] 11+Oxyco+Alc+Crt-Bund  5. Hospital discharge follow-up Patient has no further needs.  Patient does not warrant any further medication refills at this time.  Sickle cell pain is well-managed on current medication regimen.  He will continue to follow-up every 3 months for sickle cell disease.  Patient also follows up with [facility] hematology for his sickle cell needs.  Return in about 3 months (around 11/12/2022) for sickle cell anemia, diabetes.   Nolon Nations   APRN, MSN, FNP-C Patient Care [facility] [address] [place], [place] [address] [phone]

## 2022-08-13 LAB — CMP14+CBC/D/PLT+FER+RETIC+V...
ALT: 17 IU/L (ref 0–44)
AST: 18 IU/L (ref 0–40)
Albumin: 4.2 g/dL (ref 4.1–5.1)
Alkaline Phosphatase: 101 IU/L (ref 44–121)
BUN/Creatinine Ratio: 9 (ref 9–20)
BUN: 7 mg/dL (ref 6–24)
Basophils Absolute: 0.2 10*3/uL (ref 0.0–0.2)
Basos: 1 %
Bilirubin Total: 2.5 mg/dL — ABNORMAL HIGH (ref 0.0–1.2)
CO2: 16 mmol/L — ABNORMAL LOW (ref 20–29)
Calcium: 9.7 mg/dL (ref 8.7–10.2)
Chloride: 105 mmol/L (ref 96–106)
Creatinine, Ser: 0.79 mg/dL (ref 0.76–1.27)
EOS (ABSOLUTE): 1.7 10*3/uL — ABNORMAL HIGH (ref 0.0–0.4)
Eos: 10 %
Ferritin: 591 ng/mL — ABNORMAL HIGH (ref 30–400)
Globulin, Total: 2.9 g/dL (ref 1.5–4.5)
Glucose: 111 mg/dL — ABNORMAL HIGH (ref 70–99)
Hematocrit: 36.2 % — ABNORMAL LOW (ref 37.5–51.0)
Hemoglobin: 11.7 g/dL — ABNORMAL LOW (ref 13.0–17.7)
Immature Grans (Abs): 0.3 10*3/uL — ABNORMAL HIGH (ref 0.0–0.1)
Immature Granulocytes: 2 %
Lymphocytes Absolute: 5.5 10*3/uL — ABNORMAL HIGH (ref 0.7–3.1)
Lymphs: 33 %
MCH: 28.8 pg (ref 26.6–33.0)
MCHC: 32.3 g/dL (ref 31.5–35.7)
MCV: 89 fL (ref 79–97)
Monocytes Absolute: 0.8 10*3/uL (ref 0.1–0.9)
Monocytes: 5 %
NRBC: 3 % — ABNORMAL HIGH (ref 0–0)
Neutrophils Absolute: 8.1 10*3/uL — ABNORMAL HIGH (ref 1.4–7.0)
Neutrophils: 49 %
Platelets: 441 10*3/uL (ref 150–450)
Potassium: 3.8 mmol/L (ref 3.5–5.2)
RBC: 4.06 x10E6/uL — ABNORMAL LOW (ref 4.14–5.80)
RDW: 16.7 % — ABNORMAL HIGH (ref 11.6–15.4)
Retic Ct Pct: 8.8 % — ABNORMAL HIGH (ref 0.6–2.6)
Sodium: 139 mmol/L (ref 134–144)
Total Protein: 7.1 g/dL (ref 6.0–8.5)
Vit D, 25-Hydroxy: 34 ng/mL (ref 30.0–100.0)
WBC: 16.5 10*3/uL — ABNORMAL HIGH (ref 3.4–10.8)
eGFR: 110 mL/min/{1.73_m2} (ref >59)

## 2022-08-13 LAB — FRUCTOSAMINE: Fructosamine: 398 umol/L — ABNORMAL HIGH (ref 0–285)

## 2022-08-14 LAB — DRUG SCREEN 764883 11+OXYCO+ALC+CRT-BUND
Amphetamines, Urine: NEGATIVE ng/mL
BENZODIAZ UR QL: NEGATIVE ng/mL
Barbiturate: NEGATIVE ng/mL
Cannabinoid Quant, Ur: NEGATIVE ng/mL
Cocaine (Metabolite): NEGATIVE ng/mL
Creatinine: 66.4 mg/dL (ref 20.0–300.0)
Ethanol: NEGATIVE %
Meperidine: NEGATIVE ng/mL
Methadone Screen, Urine: NEGATIVE ng/mL
OPIATE SCREEN URINE: NEGATIVE ng/mL
Oxycodone/Oxymorphone, Urine: NEGATIVE ng/mL
Phencyclidine: NEGATIVE ng/mL
Propoxyphene: NEGATIVE ng/mL
Tramadol: NEGATIVE ng/mL
pH, Urine: 5.1 (ref 4.5–8.9)

## 2022-09-21 IMAGING — CR DG CHEST 2V
2 series · 2 of 2 positions shown · non-contrast
Comparison: 11/30/2019

CLINICAL DATA: Cough, fever

EXAM:
CHEST - 2 VIEW

[w chest pa]
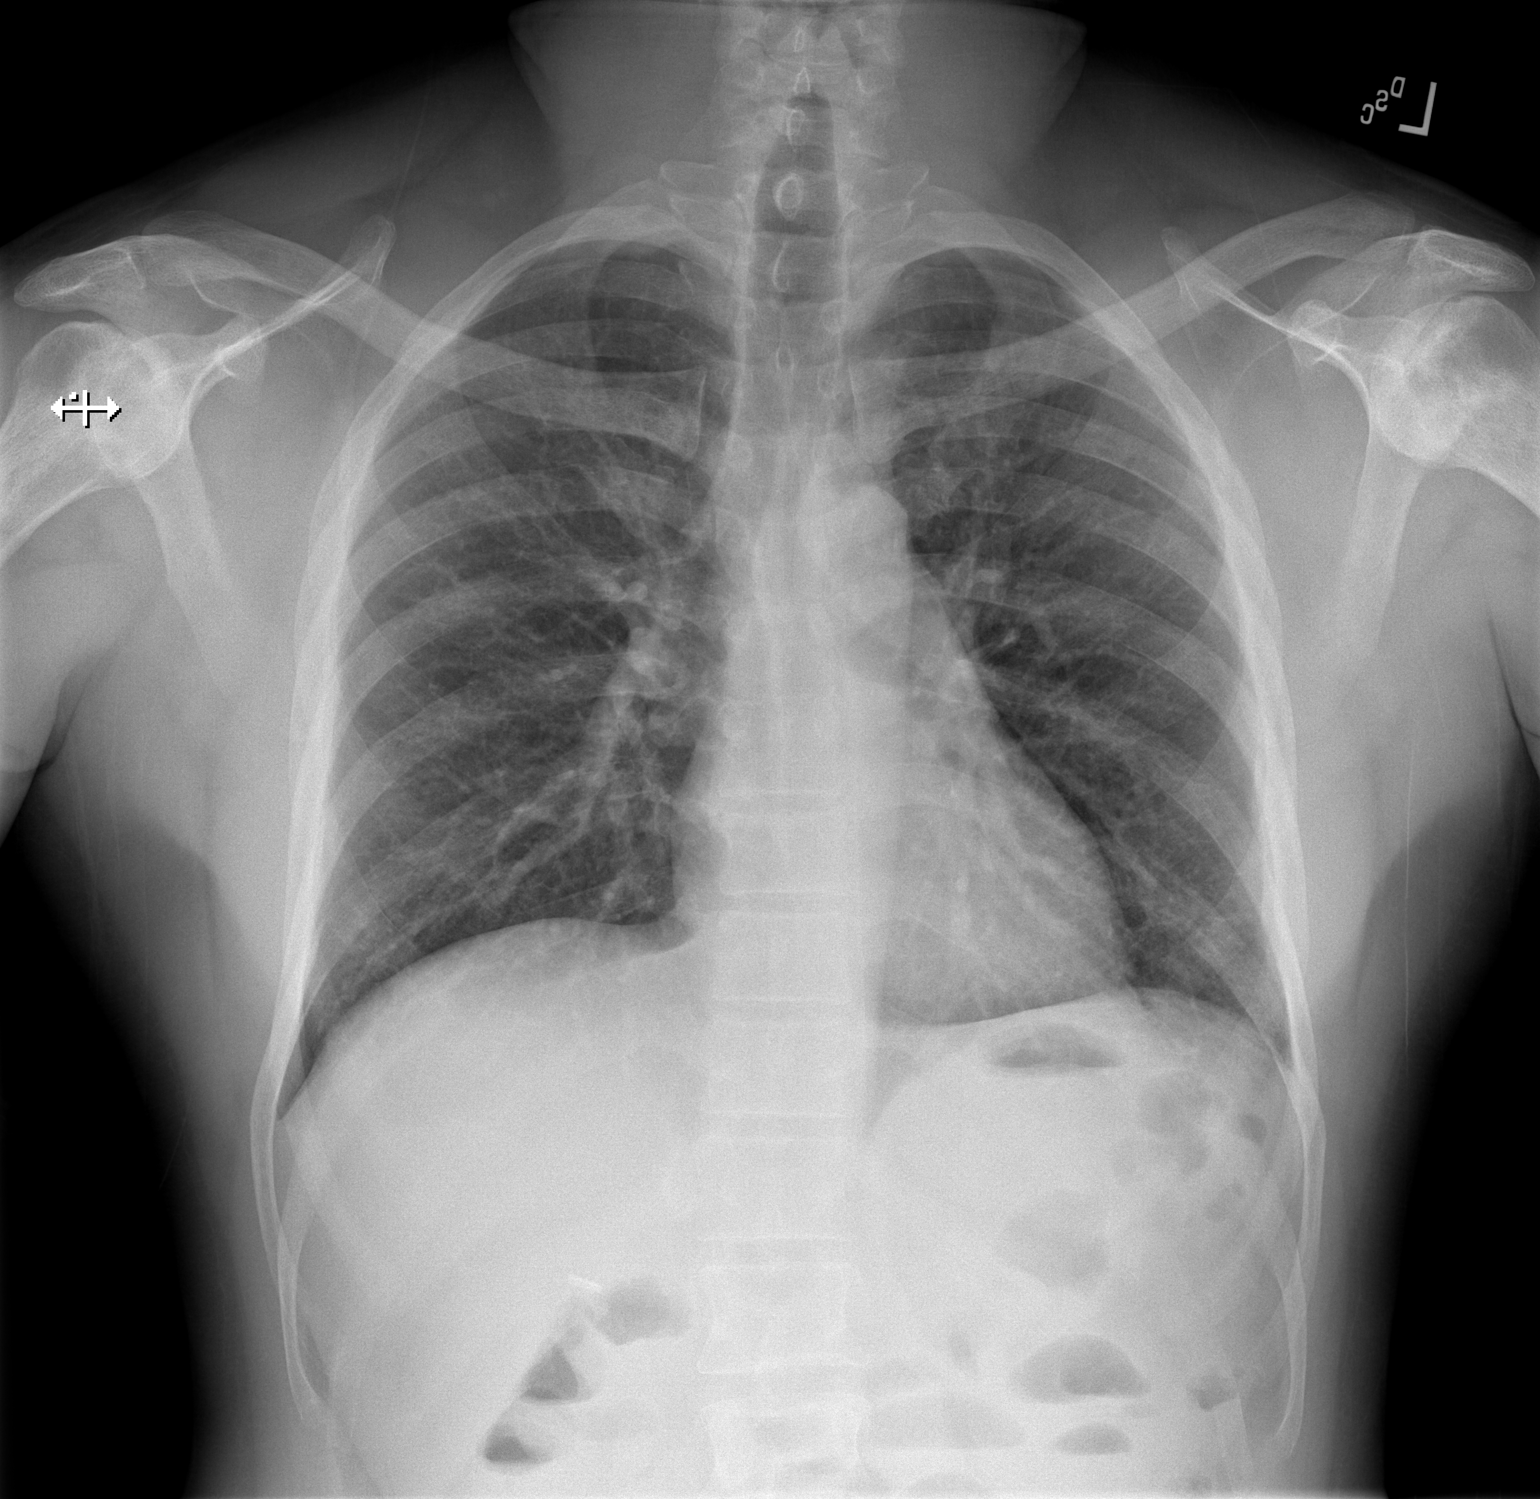

[w chest lat]
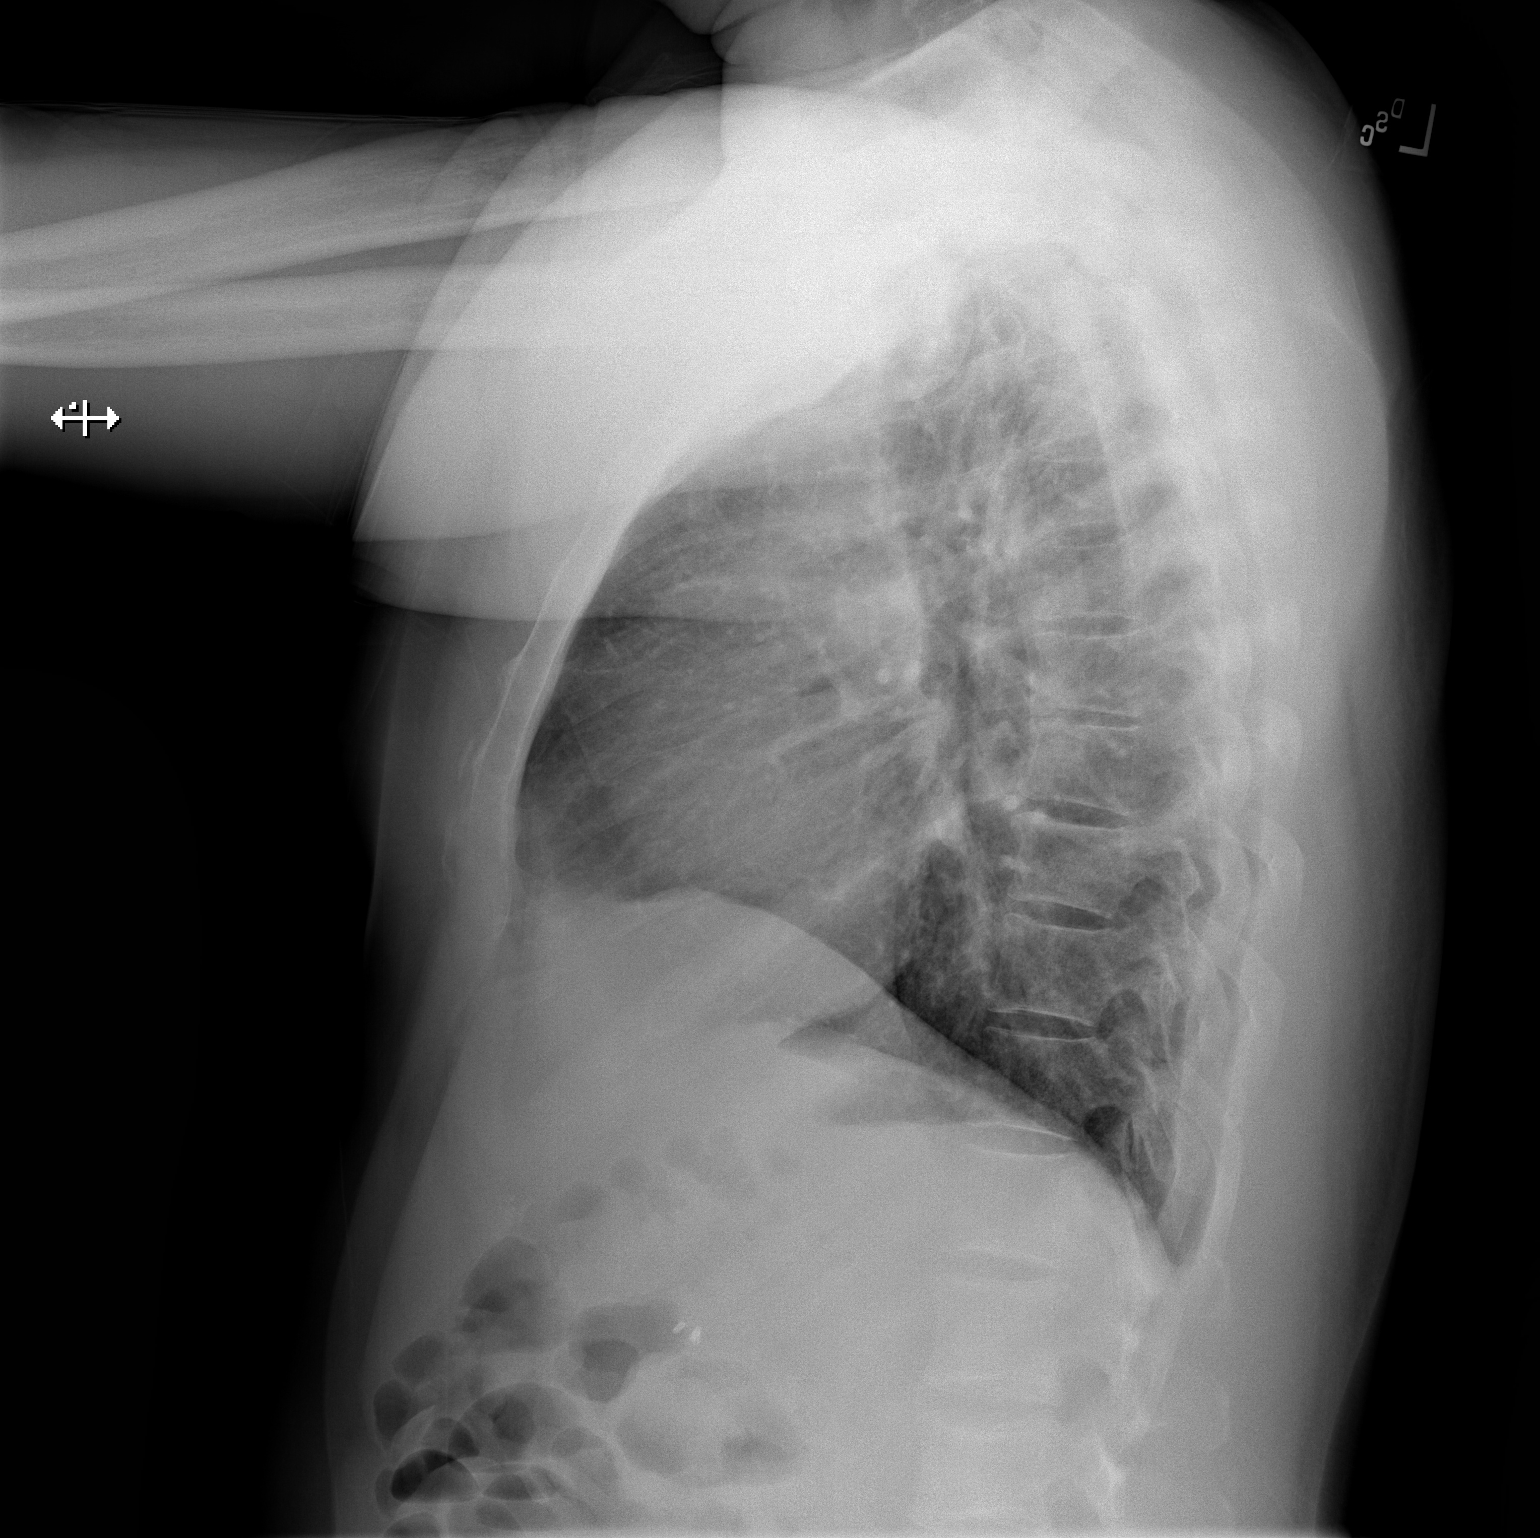

[2 of 2 positions shown; findings below may reference images not displayed]

FINDINGS: Lungs are clear. No pneumothorax or pleural effusion. Cardiac size
within normal limits. Pulmonary vascularity is normal. Irregular
sclerotic pattern within the humeral heads bilaterally is in keeping
with multiple bone infarcts in the setting of underlying sickle cell
disease.
IMPRESSION: No active cardiopulmonary disease.

## 2022-10-20 LAB — HM DIABETES EYE EXAM

## 2022-11-03 ENCOUNTER — Other Ambulatory Visit: Payer: Self-pay | Admitting: Family Medicine

## 2022-11-03 DIAGNOSIS — I10 Essential (primary) hypertension: Secondary | ICD-10-CM

## 2022-11-18 ENCOUNTER — Ambulatory Visit: Payer: Self-pay | Admitting: Family Medicine

## 2022-12-23 ENCOUNTER — Ambulatory Visit (INDEPENDENT_AMBULATORY_CARE_PROVIDER_SITE_OTHER): Payer: Medicaid Other | Admitting: Family Medicine

## 2022-12-23 VITALS — BP 132/83 | HR 64 | Temp 97.0°F | Wt 167.0 lb

## 2022-12-23 DIAGNOSIS — E119 Type 2 diabetes mellitus without complications: Secondary | ICD-10-CM

## 2022-12-23 DIAGNOSIS — M7918 Myalgia, other site: Secondary | ICD-10-CM | POA: Diagnosis not present

## 2022-12-23 DIAGNOSIS — Z23 Encounter for immunization: Secondary | ICD-10-CM | POA: Diagnosis not present

## 2022-12-23 DIAGNOSIS — D5700 Hb-SS disease with crisis, unspecified: Secondary | ICD-10-CM | POA: Diagnosis not present

## 2022-12-23 DIAGNOSIS — G8929 Other chronic pain: Secondary | ICD-10-CM

## 2022-12-23 MED ORDER — OXYCODONE HCL 10 MG PO TABS
10.0000 mg | ORAL_TABLET | ORAL | 0 refills | Status: DC | PRN
Start: 2022-12-23 — End: 2023-06-24

## 2022-12-23 NOTE — Progress Notes (Signed)
Established Patient Office Visit  Subjective   Patient ID: Gregory Bryant, male    DOB: December 09, 1975  Age: 47 y.o. MRN: [id number]  No chief complaint on file.   Gregory Bryant is a 47 year old male with a medical history significant for sickle cell disease, chronic pain syndrome, opiate dependence and tolerance, type 2 diabetes, hypertension, and hyperlipidemia accompanied by his wife for follow-up of chronic conditions.  Patient says that he has been doing well and is without complaint on today. He is not having any pain. Patient has a history of type 2 diabetes mellitus.  He has not been checking blood glucose consistently at home.  However, he has been taking his medications consistently.  He has been attempting to follow a carbohydrate modified diet as best he can.  Patient does not exercise due to chronic pain.  He denies any dizziness, headache, polyuria, polydipsia, or polyphasia. Patient has a history of hypertension.  He does not check his blood pressure at home.  He has been taking medication consistently.  No chest pain or lower extremity swelling. Patient has a history of chronic pain secondary to sickle cell disease.  Pain is well-controlled with oxycodone.  He last had medication several days ago with maximum relief.  He is not having any pain at this time.   Past Medical History:  Diagnosis Date   Diabetes mellitus without complication (HCC)    Sickle cell anemia (HCC)     Social History   Socioeconomic History   Marital status: Legally Separated    Spouse name: Not on file   Number of children: Not on file   Years of education: Not on file   Highest education level: Not on file  Occupational History   Not on file  Tobacco Use   Smoking status: Never   Smokeless tobacco: Never  Vaping Use   Vaping status: Never Used  Substance and Sexual Activity   Alcohol use: No   Drug use: No   Sexual activity: Not Currently  Other Topics Concern   Not on  file  Social History Narrative   ** Merged History Encounter **       Social Drivers of Health   Financial Resource Strain: Medium Risk (07/09/2022)   Overall Financial Resource Strain (CARDIA)    Difficulty of Paying Living Expenses: Somewhat hard  Food Insecurity: Food Insecurity Present (07/29/2022)   Hunger Vital Sign    Worried About Running Out of Food in the Last Year: Sometimes true    Ran Out of Food in the Last Year: Sometimes true  Transportation Needs: No Transportation Needs (07/29/2022)   PRAPARE - Administrator, Civil Service (Medical): No    Lack of Transportation (Non-Medical): No  Physical Activity: Sufficiently Active (07/09/2022)   Exercise Vital Sign    Days of Exercise per Week: 7 days    Minutes of Exercise per Session: 150+ min  Stress: No Stress Concern Present (07/09/2022)   [employer] of Occupational Health - Occupational Stress Questionnaire    Feeling of Stress : Only a little  Social Connections: Moderately Integrated (07/09/2022)   Social Connection and Isolation Panel [NHANES]    Frequency of Communication with Friends and Family: More than three times a week    Frequency of Social Gatherings with Friends and Family: Once a week    Attends Religious Services: More than 4 times per year    Active Member of [employer] or Organizations: Yes    Attends [employer]  or Organization Meetings: 1 to 4 times per year    Marital Status: Separated  Intimate Partner Violence: Not At Risk (07/29/2022)   Humiliation, Afraid, Rape, and Kick questionnaire    Fear of Current or Ex-Partner: No    Emotionally Abused: No    Physically Abused: No    Sexually Abused: No   Immunization History  Administered Date(s) Administered   Influenza Split 10/12/2017   Influenza, Seasonal, Injecte, Preservative Fre 12/23/2022   Influenza,inj,Quad PF,6+ Mos 04/10/2017, 10/12/2017, 10/08/2018, 10/13/2019, 10/18/2020, 01/15/2022   Influenza,inj,Quad PF,6-35 Mos 04/10/2017,  10/13/2019   Influenza-Unspecified 10/12/2017   Meningococcal B Recombinant 07/14/2019   Meningococcal B, OMV 07/14/2019, 02/03/2022   Meningococcal Conjugate 10/12/2017, 10/12/2017   Meningococcal Mcv4o 08/04/2022   PNEUMOCOCCAL CONJUGATE-20 08/04/2022   Pneumococcal Conjugate-13 04/14/2019   Pneumococcal Polysaccharide-23 10/12/2017   Tdap 07/11/2016, 10/12/2017, 07/08/2022   Allergies  Allergen Reactions   Aspirin Other (See Comments)    unknown   Sulfa Antibiotics Other (See Comments)    unknown       Review of Systems  Constitutional:  Negative for chills and fever.  HENT: Negative.    Eyes: Negative.   Respiratory: Negative.    Cardiovascular: Negative.   Genitourinary: Negative.  Negative for dysuria and urgency.  Musculoskeletal:  Positive for back pain and joint pain.  Skin: Negative.   Neurological: Negative.   Psychiatric/Behavioral: Negative.        Objective:     BP 132/83   Pulse 64   Temp (!) 97 F (36.1 C)   Wt 167 lb (75.8 kg)   SpO2 97%   BMI 26.95 kg/m  BP Readings from Last 3 Encounters:  12/23/22 132/83  08/12/22 (!) 147/83  07/31/22 128/80   Wt Readings from Last 3 Encounters:  12/23/22 167 lb (75.8 kg)  08/12/22 166 lb 12.8 oz (75.7 kg)  07/29/22 160 lb 7.9 oz (72.8 kg)      Physical Exam Constitutional:      Appearance: Normal appearance.  Cardiovascular:     Rate and Rhythm: Normal rate and regular rhythm.     Heart sounds: Normal heart sounds.  Pulmonary:     Effort: Pulmonary effort is normal.  Abdominal:     General: Abdomen is flat. Bowel sounds are normal.     Palpations: Abdomen is soft.  Skin:    General: Skin is warm.  Neurological:     General: No focal deficit present.     Mental Status: He is alert. Mental status is at baseline.  Psychiatric:        Mood and Affect: Mood normal.        Behavior: Behavior normal.        Thought Content: Thought content normal.        Judgment: Judgment normal.      No  results found for any visits on 12/23/22.  Last CBC Lab Results  Component Value Date   WBC 16.5 (H) 08/12/2022   HGB 11.7 (L) 08/12/2022   HCT 36.2 (L) 08/12/2022   MCV 89 08/12/2022   MCH 28.8 08/12/2022   RDW 16.7 (H) 08/12/2022   PLT 441 08/12/2022   Last metabolic panel Lab Results  Component Value Date   GLUCOSE 111 (H) 08/12/2022   NA 139 08/12/2022   K 3.8 08/12/2022   CL 105 08/12/2022   CO2 16 (L) 08/12/2022   BUN 7 08/12/2022   CREATININE 0.79 08/12/2022   EGFR 110 08/12/2022   CALCIUM 9.7 08/12/2022  PHOS 4.5 07/30/2022   PROT 7.1 08/12/2022   ALBUMIN 4.2 08/12/2022   LABGLOB 2.9 08/12/2022   AGRATIO 1.4 08/06/2021   BILITOT 2.5 (H) 08/12/2022   ALKPHOS 101 08/12/2022   AST 18 08/12/2022   ALT 17 08/12/2022   ANIONGAP 10 07/31/2022   Last lipids No results found for: "CHOL", "HDL", "LDLCALC", "LDLDIRECT", "TRIG", "CHOLHDL" Last hemoglobin A1c Lab Results  Component Value Date   HGBA1C 5.5 07/30/2022   Last thyroid functions Lab Results  Component Value Date   TSH 1.217 07/30/2022   Last vitamin D Lab Results  Component Value Date   VD25OH 34.0 08/12/2022   Last vitamin B12 and Folate No results found for: "VITAMINB12", "FOLATE"    The ASCVD Risk score (Arnett DK, et al., 2019) failed to calculate for the following reasons:   Cannot find a previous HDL lab   Cannot find a previous total cholesterol lab    Assessment & Plan:   Problem List Items Addressed This Visit       Endocrine   DM2 (diabetes mellitus, type 2) (HCC)   Relevant Orders   Fructosamine   CMP and Liver     Other   Sickle cell anemia with pain (HCC)   Relevant Orders   CMP and Liver   Chronic musculoskeletal pain   Relevant Medications   Oxycodone HCl 10 MG TABS   Other Visit Diagnoses     Influenza vaccine needed    -  Primary   Relevant Orders   Flu vaccine trivalent PF, 6mos and older(Flulaval,Afluria,Fluarix,Fluzone)     1. Type 2 diabetes mellitus  without complication, without long-term current use of insulin (HCC)  - Fructosamine - CMP and Liver - [id number] 11+Oxyco+Alc+Crt-Bund - Urinalysis, Routine w reflex microscopic  2. Sickle cell anemia with pain (HCC)  - CMP and Liver - Urinalysis, Routine w reflex microscopic  3. Influenza vaccine needed (Primary)  - Flu vaccine trivalent PF, 6mos and older(Flulaval,Afluria,Fluarix,Fluzone)  4. Chronic musculoskeletal pain  - Oxycodone HCl 10 MG TABS; Take 1 tablet (10 mg total) by mouth every 4 (four) hours as needed.  Dispense: 60 tablet; Refill: 0    3 months follow up of chronic conditions  Nolon Nations  APRN, MSN, FNP-C Patient Care [facility] [address] [place], [place] [address] [phone]

## 2022-12-24 LAB — DRUG SCREEN 764883 11+OXYCO+ALC+CRT-BUND
Amphetamines, Urine: NEGATIVE ng/mL
BENZODIAZ UR QL: NEGATIVE ng/mL
Barbiturate: NEGATIVE ng/mL
Cannabinoid Quant, Ur: NEGATIVE ng/mL
Cocaine (Metabolite): NEGATIVE ng/mL
Creatinine: 61.4 mg/dL (ref 20.0–300.0)
Ethanol: NEGATIVE %
Meperidine: NEGATIVE ng/mL
Methadone Screen, Urine: NEGATIVE ng/mL
OPIATE SCREEN URINE: NEGATIVE ng/mL
Oxycodone/Oxymorphone, Urine: NEGATIVE ng/mL
Phencyclidine: NEGATIVE ng/mL
Propoxyphene: NEGATIVE ng/mL
Tramadol: NEGATIVE ng/mL
pH, Urine: 5.2 (ref 4.5–8.9)

## 2022-12-24 LAB — CMP AND LIVER
ALT: 24 [IU]/L (ref 0–44)
AST: 33 [IU]/L (ref 0–40)
Albumin: 4.6 g/dL (ref 4.1–5.1)
Alkaline Phosphatase: 103 [IU]/L (ref 44–121)
BUN: 10 mg/dL (ref 6–24)
Bilirubin Total: 3.3 mg/dL — ABNORMAL HIGH (ref 0.0–1.2)
Bilirubin, Direct: 0.63 mg/dL — ABNORMAL HIGH (ref 0.00–0.40)
CO2: 18 mmol/L — ABNORMAL LOW (ref 20–29)
Calcium: 9.7 mg/dL (ref 8.7–10.2)
Chloride: 103 mmol/L (ref 96–106)
Creatinine, Ser: 0.85 mg/dL (ref 0.76–1.27)
Glucose: 194 mg/dL — ABNORMAL HIGH (ref 70–99)
Potassium: 4.7 mmol/L (ref 3.5–5.2)
Sodium: 138 mmol/L (ref 134–144)
Total Protein: 7.8 g/dL (ref 6.0–8.5)
eGFR: 108 mL/min/{1.73_m2} (ref >59)

## 2022-12-24 LAB — URINALYSIS, ROUTINE W REFLEX MICROSCOPIC
Bilirubin, UA: NEGATIVE
Glucose, UA: NEGATIVE
Ketones, UA: NEGATIVE
Leukocytes,UA: NEGATIVE
Nitrite, UA: NEGATIVE
Protein,UA: NEGATIVE
RBC, UA: NEGATIVE
Specific Gravity, UA: 1.012 (ref 1.005–1.030)
Urobilinogen, Ur: 1 mg/dL (ref 0.2–1.0)
pH, UA: 6 (ref 5.0–7.5)

## 2022-12-24 LAB — FRUCTOSAMINE: Fructosamine: 500 umol/L — ABNORMAL HIGH (ref 0–285)

## 2023-02-03 DIAGNOSIS — H5213 Myopia, bilateral: Secondary | ICD-10-CM | POA: Diagnosis not present

## 2023-02-03 DIAGNOSIS — H0288B Meibomian gland dysfunction left eye, upper and lower eyelids: Secondary | ICD-10-CM | POA: Diagnosis not present

## 2023-02-03 DIAGNOSIS — D57218 Sickle-cell/hb-c disease with crisis with other specified complication: Secondary | ICD-10-CM | POA: Diagnosis not present

## 2023-02-03 DIAGNOSIS — H0288A Meibomian gland dysfunction right eye, upper and lower eyelids: Secondary | ICD-10-CM | POA: Diagnosis not present

## 2023-02-03 DIAGNOSIS — D5720 Sickle-cell/Hb-C disease without crisis: Secondary | ICD-10-CM | POA: Diagnosis not present

## 2023-02-03 DIAGNOSIS — H36813 Nonproliferative sickle-cell retinopathy, bilateral: Secondary | ICD-10-CM | POA: Diagnosis not present

## 2023-02-25 DIAGNOSIS — E559 Vitamin D deficiency, unspecified: Secondary | ICD-10-CM | POA: Diagnosis not present

## 2023-02-25 DIAGNOSIS — D5720 Sickle-cell/Hb-C disease without crisis: Secondary | ICD-10-CM | POA: Diagnosis not present

## 2023-02-25 DIAGNOSIS — R2991 Unspecified symptoms and signs involving the musculoskeletal system: Secondary | ICD-10-CM | POA: Diagnosis not present

## 2023-02-25 DIAGNOSIS — Z79891 Long term (current) use of opiate analgesic: Secondary | ICD-10-CM | POA: Diagnosis not present

## 2023-02-27 ENCOUNTER — Other Ambulatory Visit: Payer: Self-pay | Admitting: Family Medicine

## 2023-02-27 DIAGNOSIS — E119 Type 2 diabetes mellitus without complications: Secondary | ICD-10-CM

## 2023-03-02 NOTE — Telephone Encounter (Signed)
 Please advise [name]

## 2023-03-25 ENCOUNTER — Ambulatory Visit (INDEPENDENT_AMBULATORY_CARE_PROVIDER_SITE_OTHER): Payer: Medicaid Other | Admitting: Nurse Practitioner

## 2023-03-25 ENCOUNTER — Encounter: Payer: Self-pay | Admitting: Nurse Practitioner

## 2023-03-25 VITALS — BP 119/75 | HR 72 | Temp 97.0°F | Wt 164.0 lb

## 2023-03-25 DIAGNOSIS — E119 Type 2 diabetes mellitus without complications: Secondary | ICD-10-CM

## 2023-03-25 DIAGNOSIS — D5700 Hb-SS disease with crisis, unspecified: Secondary | ICD-10-CM

## 2023-03-25 DIAGNOSIS — Z5181 Encounter for therapeutic drug level monitoring: Secondary | ICD-10-CM | POA: Diagnosis not present

## 2023-03-25 DIAGNOSIS — E1165 Type 2 diabetes mellitus with hyperglycemia: Secondary | ICD-10-CM

## 2023-03-25 DIAGNOSIS — I10 Essential (primary) hypertension: Secondary | ICD-10-CM | POA: Diagnosis not present

## 2023-03-25 DIAGNOSIS — R252 Cramp and spasm: Secondary | ICD-10-CM

## 2023-03-25 DIAGNOSIS — M8700 Idiopathic aseptic necrosis of unspecified bone: Secondary | ICD-10-CM

## 2023-03-25 DIAGNOSIS — E559 Vitamin D deficiency, unspecified: Secondary | ICD-10-CM

## 2023-03-25 HISTORY — DX: Essential (primary) hypertension: I10

## 2023-03-25 MED ORDER — LANCET DEVICE MISC: 1.0000 | Freq: Two times a day (BID) | 0 refills | Status: AC

## 2023-03-25 MED ORDER — BLOOD GLUCOSE TEST VI STRP: 1.0000 | ORAL_STRIP | Freq: Two times a day (BID) | 4 refills | Status: AC

## 2023-03-25 MED ORDER — LANCETS MISC. MISC: 1.0000 | Freq: Two times a day (BID) | 4 refills | Status: AC

## 2023-03-25 MED ORDER — BLOOD GLUCOSE MONITORING SUPPL DEVI: 1.0000 | Freq: Two times a day (BID) | 0 refills | Status: DC

## 2023-03-25 NOTE — Assessment & Plan Note (Signed)
 Continue vitamin D 50,000 units once weekly Last vitamin D Lab Results  Component Value Date   VD25OH 34.0 08/12/2022

## 2023-03-25 NOTE — Assessment & Plan Note (Signed)
 Sickle cell disease - Continue  Folic acid 1 mg daily We discussed the need for good hydration, monitoring of hydration status, avoidance of heat, cold, stress, and infection triggers.. The patient was reminded of the need to seek medical attention of any symptoms of bleeding, anemia, or infection.   Pulmonary evaluation - Patient denies severe recurrent wheezes, shortness of breath with exercise, or persistent cough. If these symptoms develop, pulmonary function tests with spirometry will be ordered, and if abnormal, plan on referral to Pulmonology for further evaluation.   Eye - High risk of proliferative retinopathy.  He is up-to-date with yearly eye exam   Immunization status - Patient is up-to-date with his vaccines  Acute and chronic painful episodes -  Pt states that he primarily utilizes Ibuprofen for mild pain related to sickle cell anemia.  Takes oxycodone 10 mg every 4 hours as needed moderate pain we discussed that pt is to receive her Schedule II prescriptions only from [place]. Pt is also aware that the prescription history is available to [place] online through the [facility] CSRS. We reminded the patient that all patients receiving Schedule II narcotics must be seen for follow within the 3 months in the office.  We reviewed the terms of our pain agreement, including the need to keep medicines in a safe locked location away from children or pets, and the need to report excess sedation or constipation, measures to avoid constipation, and policies related to early refills and stolen prescriptions. According to the [place] [facility] program, we have reviewed details related to analgesia, adverse effects, aberrant behaviors.   Controlled substances prescription agreement form completed today   - Sickle Cell Panel - ToxAssure Flex 15, Ur

## 2023-03-25 NOTE — Assessment & Plan Note (Signed)
 States that his muscle cramping has improved since he started magnesium 400 mg daily.  Continue current medication

## 2023-03-25 NOTE — Assessment & Plan Note (Signed)
 BP Readings from Last 3 Encounters:  03/25/23 119/75  12/23/22 132/83  08/12/22 (!) 147/83   HTN Controlled on lisinopril 5 g daily Continue current medications. No changes in management. Discussed DASH diet and dietary sodium restrictions Continue to increase dietary efforts and exercise.

## 2023-03-25 NOTE — Assessment & Plan Note (Deleted)
 Continue glipizide 10 mg daily, metformin 1000 mg twice daily Patient counseled on low-carb diets CBG goals discussed He is up-to-date with his yearly eye exam Checking lipid panel, patient currently not on a statin Checking fructosamine levels

## 2023-03-25 NOTE — Assessment & Plan Note (Signed)
 Patient encouraged to maintain close follow-up with orthopedics

## 2023-03-25 NOTE — Progress Notes (Signed)
 New Patient Office Visit  Subjective:  Patient ID: Gregory Bryant, male    DOB: [birth year]  Age: 48 y.o. MRN: [id number]  CC:  Chief Complaint  Patient presents with   Sickle Cell Anemia   Diabetes    HPI Gregory Bryant is a 48 y.o. male  has a past medical history of Diabetes mellitus without complication (HCC) and Sickle cell anemia (HCC).  Patient presented establish care for his chronic medical conditions.  Previous PCP Julianne Handler NP.  Sickle cell disease.  Patient is established with hematologist at [facility], he is not on a disease modifying agent at this time.  On folic acid 1 mg daily ,takes ibuprofen and oxycodone as needed for his chronic pain.  Stated that oxycodone was last taking 3 days ago.  Currently denies fever, chills, abdominal pain, nausea, vomiting.  Denies sickle cell pain currently.  Type 2 diabetes.  Currently on metformin 1000 mg twice daily, glipizide 10 mg daily, confirms that his diet can be better as he has been drinking a lot of Gatorade.Marland Kitchen  He is not on a statin. he denies polyuria polyphagia probably dips in  Avascular necrosis .he is followed by orthopedics and plans on having surgery.    Past Medical History:  Diagnosis Date   Diabetes mellitus without complication (HCC)    Sickle cell anemia (HCC)     Past Surgical History:  Procedure Laterality Date   CHOLECYSTECTOMY      Family History  Problem Relation Age of Onset   Diabetes Mother     Social History   Socioeconomic History   Marital status: Married    Spouse name: Not on file   Number of children: 2   Years of education: Not on file   Highest education level: Not on file  Occupational History   Not on file  Tobacco Use   Smoking status: Never   Smokeless tobacco: Never  Vaping Use   Vaping status: Never Used  Substance and Sexual Activity   Alcohol use: No   Drug use: No   Sexual activity: Not Currently  Other Topics Concern    Not on file  Social History Narrative   Lives with his wife    Social Drivers of Health   Financial Resource Strain: Medium Risk (07/09/2022)   Overall Financial Resource Strain (CARDIA)    Difficulty of Paying Living Expenses: Somewhat hard  Food Insecurity: Food Insecurity Present (07/29/2022)   Hunger Vital Sign    Worried About Running Out of Food in the Last Year: Sometimes true    Ran Out of Food in the Last Year: Sometimes true  Transportation Needs: No Transportation Needs (07/29/2022)   PRAPARE - Administrator, Civil Service (Medical): No    Lack of Transportation (Non-Medical): No  Physical Activity: Sufficiently Active (07/09/2022)   Exercise Vital Sign    Days of Exercise per Week: 7 days    Minutes of Exercise per Session: 150+ min  Stress: No Stress Concern Present (07/09/2022)   [employer] of Occupational Health - Occupational Stress Questionnaire    Feeling of Stress : Only a little  Social Connections: Moderately Integrated (07/09/2022)   Social Connection and Isolation Panel [NHANES]    Frequency of Communication with Friends and Family: More than three times a week    Frequency of Social Gatherings with Friends and Family: Once a week    Attends Religious Services: More than 4 times per year  Active Member of Clubs or Organizations: Yes    Attends Banker Meetings: 1 to 4 times per year    Marital Status: Separated  Intimate Partner Violence: Not At Risk (07/29/2022)   Humiliation, Afraid, Rape, and Kick questionnaire    Fear of Current or Ex-Partner: No    Emotionally Abused: No    Physically Abused: No    Sexually Abused: No    ROS Review of Systems  Constitutional:  Negative for appetite change, chills, fatigue and fever.  HENT:  Negative for congestion, postnasal drip, rhinorrhea and sneezing.   Respiratory:  Negative for cough, shortness of breath and wheezing.   Cardiovascular:  Negative for chest pain, palpitations and leg  swelling.  Gastrointestinal:  Negative for abdominal pain, constipation, nausea and vomiting.  Genitourinary:  Negative for difficulty urinating, dysuria, flank pain and frequency.  Musculoskeletal:  Negative for arthralgias, back pain, joint swelling and myalgias.  Skin:  Negative for color change, pallor, rash and wound.  Neurological:  Negative for dizziness, facial asymmetry, weakness, numbness and headaches.  Psychiatric/Behavioral:  Negative for behavioral problems, confusion, self-injury and suicidal ideas.     Objective:   Today's Vitals: BP 119/75   Pulse 72   Temp (!) 97 F (36.1 C)   Wt 164 lb (74.4 kg)   SpO2 98%   BMI 26.47 kg/m   Physical Exam Vitals and nursing note reviewed.  Constitutional:      General: He is not in acute distress.    Appearance: Normal appearance. He is not ill-appearing, toxic-appearing or diaphoretic.  HENT:     Mouth/Throat:     Mouth: Mucous membranes are moist.     Pharynx: Oropharynx is clear. No oropharyngeal exudate or posterior oropharyngeal erythema.  Eyes:     General: No scleral icterus.       Right eye: No discharge.        Left eye: No discharge.     Extraocular Movements: Extraocular movements intact.     Conjunctiva/sclera: Conjunctivae normal.  Cardiovascular:     Rate and Rhythm: Normal rate and regular rhythm.     Pulses: Normal pulses.     Heart sounds: Normal heart sounds. No murmur heard.    No friction rub. No gallop.  Pulmonary:     Effort: Pulmonary effort is normal. No respiratory distress.     Breath sounds: Normal breath sounds. No stridor. No wheezing, rhonchi or rales.  Chest:     Chest wall: No tenderness.  Abdominal:     General: There is no distension.     Palpations: Abdomen is soft.     Tenderness: There is no abdominal tenderness. There is no right CVA tenderness, left CVA tenderness or guarding.  Musculoskeletal:        General: No swelling, deformity or signs of injury.     Right lower leg: No  edema.     Left lower leg: No edema.  Skin:    General: Skin is warm and dry.     Capillary Refill: Capillary refill takes less than 2 seconds.     Coloration: Skin is not jaundiced or pale.     Findings: No bruising, erythema or lesion.  Neurological:     Mental Status: He is alert and oriented to person, place, and time.     Motor: No weakness.     Coordination: Coordination normal.     Gait: Gait normal.  Psychiatric:        Mood and Affect:  Mood normal.        Behavior: Behavior normal.        Thought Content: Thought content normal.        Judgment: Judgment normal.     Assessment & Plan:   Problem List Items Addressed This Visit       Cardiovascular and Mediastinum   Essential hypertension   BP Readings from Last 3 Encounters:  03/25/23 119/75  12/23/22 132/83  08/12/22 (!) 147/83   HTN Controlled on lisinopril 5 g daily Continue current medications. No changes in management. Discussed DASH diet and dietary sodium restrictions Continue to increase dietary efforts and exercise.           Endocrine   DM2 (diabetes mellitus, type 2) (HCC) - Primary   Relevant Orders   Microalbumin/Creatinine Ratio, Urine   Lipid panel   Fructosamine   Uncontrolled type 2 diabetes mellitus with hyperglycemia (HCC)   Continue glipizide 10 mg daily, metformin 1000 mg twice daily Patient counseled on low-carb diets CBG goals discussed He is up-to-date with his yearly eye exam Checking lipid panel, patient currently not on a statin Checking fructosamine levels        Musculoskeletal and Integument   Avascular necrosis (HCC)   Patient encouraged to maintain close follow-up with orthopedics        Other   Sickle cell anemia with pain (HCC)   Sickle cell disease - Continue  Folic acid 1 mg daily We discussed the need for good hydration, monitoring of hydration status, avoidance of heat, cold, stress, and infection triggers.. The patient was reminded of the need to seek  medical attention of any symptoms of bleeding, anemia, or infection.   Pulmonary evaluation - Patient denies severe recurrent wheezes, shortness of breath with exercise, or persistent cough. If these symptoms develop, pulmonary function tests with spirometry will be ordered, and if abnormal, plan on referral to Pulmonology for further evaluation.   Eye - High risk of proliferative retinopathy.  He is up-to-date with yearly eye exam   Immunization status - Patient is up-to-date with his vaccines  Acute and chronic painful episodes -  Pt states that he primarily utilizes Ibuprofen for mild pain related to sickle cell anemia.  Takes oxycodone 10 mg every 4 hours as needed moderate pain we discussed that pt is to receive her Schedule II prescriptions only from [place]. Pt is also aware that the prescription history is available to [place] online through the [facility] CSRS. We reminded the patient that all patients receiving Schedule II narcotics must be seen for follow within the 3 months in the office.  We reviewed the terms of our pain agreement, including the need to keep medicines in a safe locked location away from children or pets, and the need to report excess sedation or constipation, measures to avoid constipation, and policies related to early refills and stolen prescriptions. According to the [place] [facility] program, we have reviewed details related to analgesia, adverse effects, aberrant behaviors.   Controlled substances prescription agreement form completed today   - Sickle Cell Panel - ToxAssure Flex 15, Ur        Relevant Orders   Sickle Cell Panel   ToxAssure Flex 15, Ur   Vitamin D deficiency   Continue vitamin D 50,000 units once weekly Last vitamin D Lab Results  Component Value Date   VD25OH 34.0 08/12/2022         Muscle cramping   States that his muscle cramping has  improved since he started magnesium 400 mg daily.  Continue current medication       Outpatient  Encounter Medications as of 03/25/2023  Medication Sig   Blood Glucose Monitoring Suppl DEVI 1 each by Does not apply route 2 (two) times daily. May substitute to any manufacturer covered by patient's insurance.   Blood Glucose Monitoring Suppl DEVI 1 each by Does not apply route in the morning and at bedtime. May substitute to any manufacturer covered by patient's insurance.   folic acid (FOLVITE) 1 MG tablet Take 1 tablet (1 mg total) by mouth daily.   glipiZIDE (GLUCOTROL) 10 MG tablet Take 1 tablet (10 mg total) by mouth daily before breakfast.   Glucose Blood (BLOOD GLUCOSE TEST STRIPS) STRP 1 each by In Vitro route 2 (two) times daily. May substitute to any manufacturer covered by patient's insurance.   ibuprofen (ADVIL) 600 MG tablet Take 1 tablet (600 mg total) by mouth every 8 (eight) hours as needed.   Lancet Device MISC 1 each by Does not apply route in the morning and at bedtime. May substitute to any manufacturer covered by patient's insurance.   Lancets Misc. MISC 1 each by Does not apply route 2 (two) times daily. May substitute to any manufacturer covered by patient's insurance.   lisinopril (ZESTRIL) 5 MG tablet Take 1 tablet (5 mg total) by mouth daily.   magnesium oxide (MAG-OX) 400 MG tablet Take 400 mg by mouth daily.   metFORMIN (GLUCOPHAGE) 1000 MG tablet TAKE ONE TABLET (1000 mg total) TWO TIMES DAILY WITH MEALS   ondansetron (ZOFRAN ODT) 8 MG disintegrating tablet Take 1 tablet (8 mg total) by mouth every 8 (eight) hours as needed for nausea or vomiting.   Oxycodone HCl 10 MG TABS Take 1 tablet (10 mg total) by mouth every 4 (four) hours as needed.   Vitamin D, Ergocalciferol, (DRISDOL) 1.25 MG (50000 UNIT) CAPS capsule Take 1 capsule (50,000 Units total) by mouth every 7 (seven) days. Mondays (Patient taking differently: Take 50,000 Units by mouth every 30 (thirty) days.)   diclofenac (VOLTAREN) 75 MG EC tablet Take 1 tablet (75 mg total) by mouth 2 (two) times daily.  (Patient not taking: Reported on 07/11/2022)   mupirocin ointment (BACTROBAN) 2 % Apply 1 Application topically 2 (two) times daily. (Patient not taking: Reported on 12/23/2022)   [DISCONTINUED] cetirizine (ZYRTEC) 10 MG tablet Take 1 tablet (10 mg total) by mouth daily. (Patient not taking: Reported on 11/30/2019)   [DISCONTINUED] fluticasone (FLONASE) 50 MCG/ACT nasal spray Place 2 sprays into both nostrils daily. (Patient not taking: Reported on 11/30/2019)   No facility-administered encounter medications on file as of 03/25/2023.    Follow-up: Return in about 3 months (around 06/22/2023) for HTN, DM.   Donell Beers, FNP

## 2023-03-25 NOTE — Patient Instructions (Signed)
 Goal for fasting blood sugar ranges from 80 to 120 and 2 hours after any meal or at bedtime should be between 130 to 170.   It is important that you exercise regularly at least 30 minutes 5 times a week as tolerated  Think about what you will eat, plan ahead. Choose " clean, green, fresh or frozen" over canned, processed or packaged foods which are more sugary, salty and fatty. 70 to 75% of food eaten should be vegetables and fruit. Three meals at set times with snacks allowed between meals, but they must be fruit or vegetables. Aim to eat over a 12 hour period , example 7 am to 7 pm, and STOP after  your last meal of the day. Drink water,generally about 64 ounces per day, no other drink is as healthy. Fruit juice is best enjoyed in a healthy way, by EATING the fruit.  Thanks for choosing [facility] we consider it a privelige to serve you.

## 2023-03-25 NOTE — Assessment & Plan Note (Signed)
 Continue glipizide 10 mg daily, metformin 1000 mg twice daily Patient counseled on low-carb diets CBG goals discussed He is up-to-date with his yearly eye exam Checking lipid panel, patient currently not on a statin Checking fructosamine levels

## 2023-03-26 LAB — CMP14+CBC/D/PLT+FER+RETIC+V...
ALT: 30 [IU]/L (ref 0–44)
AST: 32 [IU]/L (ref 0–40)
Albumin: 4.4 g/dL (ref 4.1–5.1)
Alkaline Phosphatase: 127 [IU]/L — ABNORMAL HIGH (ref 44–121)
BUN/Creatinine Ratio: 12 (ref 9–20)
BUN: 11 mg/dL (ref 6–24)
Basophils Absolute: 0.2 10*3/uL (ref 0.0–0.2)
Basos: 1 %
Bilirubin Total: 3.7 mg/dL — ABNORMAL HIGH (ref 0.0–1.2)
CO2: 20 mmol/L (ref 20–29)
Calcium: 9.7 mg/dL (ref 8.7–10.2)
Chloride: 100 mmol/L (ref 96–106)
Creatinine, Ser: 0.93 mg/dL (ref 0.76–1.27)
EOS (ABSOLUTE): 0.3 10*3/uL (ref 0.0–0.4)
Eos: 2 %
Ferritin: 1298 ng/mL — ABNORMAL HIGH (ref 30–400)
Globulin, Total: 3.4 g/dL (ref 1.5–4.5)
Glucose: 238 mg/dL — ABNORMAL HIGH (ref 70–99)
Hematocrit: 36.1 % — ABNORMAL LOW (ref 37.5–51.0)
Hemoglobin: 12.2 g/dL — ABNORMAL LOW (ref 13.0–17.7)
Immature Grans (Abs): 0.3 10*3/uL — ABNORMAL HIGH (ref 0.0–0.1)
Immature Granulocytes: 2 %
Lymphocytes Absolute: 4.1 10*3/uL — ABNORMAL HIGH (ref 0.7–3.1)
Lymphs: 28 %
MCH: 29.5 pg (ref 26.6–33.0)
MCHC: 33.8 g/dL (ref 31.5–35.7)
MCV: 87 fL (ref 79–97)
Monocytes Absolute: 1.2 10*3/uL — ABNORMAL HIGH (ref 0.1–0.9)
Monocytes: 8 %
NRBC: 3 % — ABNORMAL HIGH (ref 0–0)
Neutrophils Absolute: 8.6 10*3/uL — ABNORMAL HIGH (ref 1.4–7.0)
Neutrophils: 59 %
Platelets: 401 10*3/uL (ref 150–450)
Potassium: 5.1 mmol/L (ref 3.5–5.2)
RBC: 4.14 x10E6/uL (ref 4.14–5.80)
RDW: 15.4 % (ref 11.6–15.4)
Retic Ct Pct: 7.7 % — ABNORMAL HIGH (ref 0.6–2.6)
Sodium: 134 mmol/L (ref 134–144)
Total Protein: 7.8 g/dL (ref 6.0–8.5)
Vit D, 25-Hydroxy: 29.7 ng/mL — ABNORMAL LOW (ref 30.0–100.0)
WBC: 14.7 10*3/uL — ABNORMAL HIGH (ref 3.4–10.8)
eGFR: 102 mL/min/{1.73_m2} (ref >59)

## 2023-03-26 LAB — LIPID PANEL
Chol/HDL Ratio: 4.1 {ratio} (ref 0.0–5.0)
Cholesterol, Total: 186 mg/dL (ref 100–199)
HDL: 45 mg/dL (ref >39)
LDL Chol Calc (NIH): 128 mg/dL — ABNORMAL HIGH (ref 0–99)
Triglycerides: 71 mg/dL (ref 0–149)
VLDL Cholesterol Cal: 13 mg/dL (ref 5–40)

## 2023-03-26 LAB — MICROALBUMIN / CREATININE URINE RATIO
Creatinine, Urine: 51.9 mg/dL
Microalb/Creat Ratio: 20 mg/g{creat} (ref 0–29)
Microalbumin, Urine: 10.6 ug/mL

## 2023-03-26 LAB — FRUCTOSAMINE: Fructosamine: 582 umol/L — ABNORMAL HIGH (ref 0–285)

## 2023-03-27 ENCOUNTER — Other Ambulatory Visit: Payer: Self-pay | Admitting: Nurse Practitioner

## 2023-03-27 DIAGNOSIS — E785 Hyperlipidemia, unspecified: Secondary | ICD-10-CM

## 2023-03-27 DIAGNOSIS — E1165 Type 2 diabetes mellitus with hyperglycemia: Secondary | ICD-10-CM

## 2023-03-29 LAB — TOXASSURE FLEX 15, UR
6-ACETYLMORPHINE IA: NEGATIVE ng/mL
AMPHETAMINES IA: NEGATIVE ng/mL
BARBITURATES IA: NEGATIVE ng/mL
CANNABINOIDS IA: NEGATIVE ng/mL
COCAINE METABOLITE IA: NEGATIVE ng/mL
Creatinine: 58 mg/dL
ETHYL ALCOHOL Enzymatic: NEGATIVE g/dL
METHADONE IA: NEGATIVE ng/mL
METHADONE MTB IA: NEGATIVE ng/mL
OPIATE CLASS IA: NEGATIVE ng/mL
OXYCODONE CLASS IA: NEGATIVE ng/mL
PHENCYCLIDINE IA: NEGATIVE ng/mL
TAPENTADOL, IA: NEGATIVE ng/mL
TRAMADOL IA: NEGATIVE ng/mL

## 2023-03-31 DIAGNOSIS — D5720 Sickle-cell/Hb-C disease without crisis: Secondary | ICD-10-CM | POA: Diagnosis not present

## 2023-04-08 ENCOUNTER — Telehealth: Payer: Self-pay

## 2023-04-08 DIAGNOSIS — E1165 Type 2 diabetes mellitus with hyperglycemia: Secondary | ICD-10-CM

## 2023-04-08 NOTE — Progress Notes (Signed)
   04/08/2023  Patient ID: Gregory Bryant, male   DOB: July 04, 1975, 48 y.o.   MRN: [id number]  Attempted to contact patient for medication management/review. Left HIPAA compliant message for patient to return my call at their convenience.   First attempt for patient outreach. Will follow up with patient in 5-7 business days.  Thank you for allowing pharmacy to be a part of this patient's care.  Cephus Shelling, PharmD Clinical Pharmacist Cell: [phone]

## 2023-05-26 ENCOUNTER — Other Ambulatory Visit: Payer: Self-pay | Admitting: Nurse Practitioner

## 2023-05-26 DIAGNOSIS — E119 Type 2 diabetes mellitus without complications: Secondary | ICD-10-CM

## 2023-05-27 ENCOUNTER — Other Ambulatory Visit: Payer: Self-pay | Admitting: Family Medicine

## 2023-05-27 DIAGNOSIS — R2991 Unspecified symptoms and signs involving the musculoskeletal system: Secondary | ICD-10-CM | POA: Diagnosis not present

## 2023-05-27 DIAGNOSIS — E559 Vitamin D deficiency, unspecified: Secondary | ICD-10-CM | POA: Diagnosis not present

## 2023-05-27 DIAGNOSIS — I10 Essential (primary) hypertension: Secondary | ICD-10-CM

## 2023-05-27 DIAGNOSIS — D5720 Sickle-cell/Hb-C disease without crisis: Secondary | ICD-10-CM | POA: Diagnosis not present

## 2023-05-27 DIAGNOSIS — R252 Cramp and spasm: Secondary | ICD-10-CM | POA: Diagnosis not present

## 2023-06-16 ENCOUNTER — Telehealth: Payer: Self-pay

## 2023-06-16 ENCOUNTER — Other Ambulatory Visit: Payer: Self-pay

## 2023-06-16 DIAGNOSIS — E1165 Type 2 diabetes mellitus with hyperglycemia: Secondary | ICD-10-CM

## 2023-06-16 NOTE — Progress Notes (Addendum)
   06/16/2023  Patient ID: Gregory Bryant, male   DOB: [birth year], 48 y.o.   MRN: [id number]  Attempted to contact patient for medication management/review. Left HIPAA compliant message for patient to return my call at their convenience. Referral was placed for diabetes and hyperlipidemia management. Upcoming appointment with PCP on 06/24/2023.   Chart reviewed and assessed medication adherence with Dr. Anson Basta patient portal. Per chart review, patient last lipid panel came back abnormal and patient is not on a statin. Total bilirubin elevated and no imaging or prior documentation of cirrhosis or ascites. If unable to reach patient, may consider starting rosuvastatin 5 mg once daily due to estimated Child Pugh B score (no INR lab available to accurately calculate child pugh) along with lifestyle medications. Max dose of rosuvastatin with Child Pugh B is 10 mg daily. Will defer INR lab for PCP to decide on necessity. Also, reviewed medication related to liver impairment, no other medications noted for dose adjustment.  Additionally, POCT glucose has been elevated and last hemoglobin A1c 5.5% on 07/30/2022. Will defer updated hemoglobin A1c for PCP.   The 10-year ASCVD risk score (Arnett DK, et al., 2019) is: 14.8%   Values used to calculate the score:     Age: 48 years     Sex: Male     Is Non-Hispanic African American: Yes     Diabetic: Yes     Tobacco smoker: No     Systolic Blood Pressure: 128 mmHg     Is BP treated: Yes     HDL Cholesterol: 45 mg/dL     Total Cholesterol: 186 mg/dL   Second attempt for patient outreach. Will reach out to [facility], NT to reschedule.    Thank you for allowing pharmacy to be a part of this patient's care.  Alexandria Angel, PharmD Clinical Pharmacist Cell: [phone]

## 2023-06-16 NOTE — Progress Notes (Signed)
   06/16/2023  Patient ID: Gregory Bryant, male   DOB: April 03, 1975, 49 y.o.   MRN: [id number]  Patient called back and was ask to reschedule since he has an upcoming appointment with PCP on 06/24/2023. Patient was agreeable to schedule appointment with pharmacist after he sees PCP.   Patient states that he is available on Tuesdays and Wednesdays between 2 pm and 4 pm.   Alexandria Angel, PharmD Clinical Pharmacist Cell: [phone]

## 2023-06-23 ENCOUNTER — Other Ambulatory Visit: Payer: Self-pay | Admitting: Nurse Practitioner

## 2023-06-23 DIAGNOSIS — G8929 Other chronic pain: Secondary | ICD-10-CM

## 2023-06-23 DIAGNOSIS — D5700 Hb-SS disease with crisis, unspecified: Secondary | ICD-10-CM

## 2023-06-23 NOTE — Telephone Encounter (Signed)
 Reschedule for 07/06/23  Barnie Bora  [facility]  [facility]  Direct Dial: [phone]  Fax [phone]

## 2023-06-23 NOTE — Telephone Encounter (Unsigned)
 Copied from CRM [id number]. Topic: Clinical - Medication Refill >> Jun 23, 2023 10:49 AM Crispin Dolphin wrote: Medication: Oxycodone  HCl 10 MG TABS  Has the patient contacted their pharmacy? No (Agent: If no, request that the patient contact the pharmacy for the refill. If patient does not wish to contact the pharmacy document the reason why and proceed with request.) (Agent: If yes, when and what did the pharmacy advise?)  This is the patient's preferred pharmacy:  [facility] - [place], [place] - [address] [place] [address] Phone: [phone] Fax: [phone]  Is this the correct pharmacy for this prescription? Yes If no, delete pharmacy and type the correct one.   Has the prescription been filled recently? No  Is the patient out of the medication? Yes  Has the patient been seen for an appointment in the last year OR does the patient have an upcoming appointment? Yes  Can we respond through MyChart? Yes  Agent: Please be advised that Rx refills may take up to 3 business days. We ask that you follow-up with your pharmacy.

## 2023-06-24 ENCOUNTER — Ambulatory Visit (INDEPENDENT_AMBULATORY_CARE_PROVIDER_SITE_OTHER): Payer: Self-pay | Admitting: Nurse Practitioner

## 2023-06-24 ENCOUNTER — Encounter: Payer: Self-pay | Admitting: Nurse Practitioner

## 2023-06-24 VITALS — BP 130/81 | HR 94 | Temp 97.3°F | Wt 158.0 lb

## 2023-06-24 DIAGNOSIS — M7918 Myalgia, other site: Secondary | ICD-10-CM

## 2023-06-24 DIAGNOSIS — D5700 Hb-SS disease with crisis, unspecified: Secondary | ICD-10-CM

## 2023-06-24 DIAGNOSIS — E785 Hyperlipidemia, unspecified: Secondary | ICD-10-CM

## 2023-06-24 DIAGNOSIS — M8700 Idiopathic aseptic necrosis of unspecified bone: Secondary | ICD-10-CM | POA: Diagnosis not present

## 2023-06-24 DIAGNOSIS — E559 Vitamin D deficiency, unspecified: Secondary | ICD-10-CM

## 2023-06-24 DIAGNOSIS — I10 Essential (primary) hypertension: Secondary | ICD-10-CM

## 2023-06-24 DIAGNOSIS — R17 Unspecified jaundice: Secondary | ICD-10-CM

## 2023-06-24 DIAGNOSIS — E1165 Type 2 diabetes mellitus with hyperglycemia: Secondary | ICD-10-CM | POA: Diagnosis not present

## 2023-06-24 DIAGNOSIS — G8929 Other chronic pain: Secondary | ICD-10-CM

## 2023-06-24 DIAGNOSIS — R748 Abnormal levels of other serum enzymes: Secondary | ICD-10-CM

## 2023-06-24 DIAGNOSIS — Z5181 Encounter for therapeutic drug level monitoring: Secondary | ICD-10-CM | POA: Diagnosis not present

## 2023-06-24 HISTORY — DX: Hyperlipidemia, unspecified: E78.5

## 2023-06-24 MED ORDER — OXYCODONE HCL 10 MG PO TABS: 10.0000 mg | ORAL_TABLET | ORAL | 0 refills | Status: DC | PRN

## 2023-06-24 MED ORDER — IBUPROFEN 600 MG PO TABS: 600.0000 mg | ORAL_TABLET | Freq: Three times a day (TID) | ORAL | 5 refills | Status: DC | PRN

## 2023-06-24 MED ORDER — FOLIC ACID 1 MG PO TABS: 1.0000 mg | ORAL_TABLET | Freq: Every day | ORAL | 2 refills | Status: DC

## 2023-06-24 MED ORDER — GLIPIZIDE 10 MG PO TABS: 10.0000 mg | ORAL_TABLET | Freq: Every day | ORAL | 1 refills | Status: DC

## 2023-06-24 NOTE — Assessment & Plan Note (Signed)
 Lab Results  Component Value Date   CHOL 186 03/25/2023   HDL 45 03/25/2023   LDLCALC 128 (H) 03/25/2023   TRIG 71 03/25/2023   CHOLHDL 4.1 03/25/2023  Currently not on medication Due to elevated bilirubin  checking INR to help determine if it is safe to start crestor

## 2023-06-24 NOTE — Assessment & Plan Note (Signed)
Followed by orthopedics

## 2023-06-24 NOTE — Patient Instructions (Addendum)
 Goal for fasting blood sugar ranges from 80 to 120 and 2 hours after any meal or at bedtime should be between 130 to 170.     Aaron Aas Uncontrolled type 2 diabetes mellitus with hyperglycemia (HCC)  - Fructosamine - Direct LDL - glipiZIDE  (GLUCOTROL ) 10 MG tablet; Take 1 tablet (10 mg total) by mouth daily before breakfast.  Dispense: 90 tablet; Refill: 1 - Ambulatory referral to diabetic education - AMB Referral [facility] Care Management     Sickle cell anemia with pain (HCC)  - Sickle Cell Panel - [id number] 11+Oxyco+Alc+Crt-Bund - folic acid  (FOLVITE ) 1 MG tablet; Take 1 tablet (1 mg total) by mouth daily.  Dispense: 90 tablet; Refill: 2 - Oxycodone  HCl 10 MG TABS; Take 1 tablet (10 mg total) by mouth every 4 (four) hours as needed.  Dispense: 60 tablet; Refill: 0 - ibuprofen  (ADVIL ) 600 MG tablet; Take 1 tablet (600 mg total) by mouth every 8 (eight) hours as needed for mild pain (pain score 1-3).  Dispense: 30 tablet; Refill: 5   Chronic musculoskeletal pain  - Oxycodone  HCl 10 MG TABS; Take 1 tablet (10 mg total) by mouth every 4 (four) hours as needed.  Dispense: 60 tablet; Refill: 0       It is important that you exercise regularly at least 30 minutes 5 times a week as tolerated  Think about what you will eat, plan ahead. Choose " clean, green, fresh or frozen" over canned, processed or packaged foods which are more sugary, salty and fatty. 70 to 75% of food eaten should be vegetables and fruit. Three meals at set times with snacks allowed between meals, but they must be fruit or vegetables. Aim to eat over a 12 hour period , example 7 am to 7 pm, and STOP after  your last meal of the day. Drink water,generally about 64 ounces per day, no other drink is as healthy. Fruit juice is best enjoyed in a healthy way, by EATING the fruit.  Thanks for choosing [facility] we consider it a privelige to serve you.

## 2023-06-24 NOTE — Progress Notes (Signed)
 Established Patient Office Visit  Subjective:  Patient ID: Gregory Bryant, male    DOB: [birth year]  Age: 48 y.o. MRN: [id number]  CC:  Chief Complaint  Patient presents with   Hypertension   Diabetes   Sickle Cell Anemia    HPI Gregory Bryant is a 48 y.o. male  has a past medical history of Diabetes mellitus without complication (HCC) and Sickle cell anemia (HCC).  Patient presents for follow-up for his chronic medical conditions  Type 2 diabetes.  Currently on metformin  1000 mg twice daily, has not taking glipizide  for about 2 months because he was not able to refill the prescription at the pharmacy.  random blood sugar readings at home has been in the 250s ,no complaints of polyphagia polyuria.    Sickle cell disease.  Currently on folic acid  1 mg daily, recently started on hydroxyurea 500 mg by the hematologist but he has not started taking the medication.  For chronic pain oxycodone  10 mg every 4 hours as needed, oxycodone  was last taken yesterday.  He is pain level is currently rated 4/10 and its mainly in his back and legs. Has a RX for ibuprofen  but he does not take the medication. He denies fever , chills  CP, SOB, abdominal pain , nausea , vomiting.  Hypertension.  Currently on lisinopril  5 mg daily.  He has not taken medication today.  Denies chest pain shortness of breath edema  Patient is accompanied by his spouse     Past Medical History:  Diagnosis Date   Diabetes mellitus without complication (HCC)    Sickle cell anemia (HCC)     Past Surgical History:  Procedure Laterality Date   CHOLECYSTECTOMY      Family History  Problem Relation Age of Onset   Diabetes Mother     Social History   Socioeconomic History   Marital status: Married    Spouse name: Not on file   Number of children: 2   Years of education: Not on file   Highest education level: Not on file  Occupational History   Not on file  Tobacco Use   Smoking  status: Never   Smokeless tobacco: Never  Vaping Use   Vaping status: Never Used  Substance and Sexual Activity   Alcohol use: No   Drug use: No   Sexual activity: Not Currently  Other Topics Concern   Not on file  Social History Narrative   Lives with his wife    Social Drivers of Health   Financial Resource Strain: Medium Risk (07/09/2022)   Overall Financial Resource Strain (CARDIA)    Difficulty of Paying Living Expenses: Somewhat hard  Food Insecurity: Food Insecurity Present (07/29/2022)   Hunger Vital Sign    Worried About Running Out of Food in the Last Year: Sometimes true    Ran Out of Food in the Last Year: Sometimes true  Transportation Needs: No Transportation Needs (07/29/2022)   PRAPARE - Administrator, Civil Service (Medical): No    Lack of Transportation (Non-Medical): No  Physical Activity: Sufficiently Active (07/09/2022)   Exercise Vital Sign    Days of Exercise per Week: 7 days    Minutes of Exercise per Session: 150+ min  Stress: No Stress Concern Present (07/09/2022)   [employer] of Occupational Health - Occupational Stress Questionnaire    Feeling of Stress : Only a little  Social Connections: Moderately Integrated (07/09/2022)   Social Connection and Isolation Panel [NHANES]  Frequency of Communication with Friends and Family: More than three times a week    Frequency of Social Gatherings with Friends and Family: Once a week    Attends Religious Services: More than 4 times per year    Active Member of [employer] or Organizations: Yes    Attends Banker Meetings: 1 to 4 times per year    Marital Status: Separated  Intimate Partner Violence: Not At Risk (07/29/2022)   Humiliation, Afraid, Rape, and Kick questionnaire    Fear of Current or Ex-Partner: No    Emotionally Abused: No    Physically Abused: No    Sexually Abused: No    Outpatient Medications Prior to Visit  Medication Sig Dispense Refill   Blood Glucose Monitoring  Suppl DEVI 1 each by Does not apply route 2 (two) times daily. May substitute to any manufacturer covered by patient's insurance. 1 each 0   Glucose Blood (BLOOD GLUCOSE TEST STRIPS) STRP 1 each by In Vitro route 2 (two) times daily. May substitute to any manufacturer covered by patient's insurance. 200 strip 4   hydroxyurea (HYDREA) 500 MG capsule Take 500 mg by mouth daily.     lisinopril  (ZESTRIL ) 5 MG tablet Take 1 tablet (5 mg total) by mouth daily. 90 tablet 1   magnesium oxide (MAG-OX) 400 MG tablet Take 400 mg by mouth daily.     metFORMIN  (GLUCOPHAGE ) 1000 MG tablet TAKE ONE TABLET BY MOUTH TWICE DAILY WITH MEALS 180 tablet 0   ondansetron  (ZOFRAN  ODT) 8 MG disintegrating tablet Take 1 tablet (8 mg total) by mouth every 8 (eight) hours as needed for nausea or vomiting. 20 tablet 0   Vitamin D , Ergocalciferol , (DRISDOL ) 1.25 MG (50000 UNIT) CAPS capsule Take 1 capsule (50,000 Units total) by mouth every 7 (seven) days. Mondays (Patient taking differently: Take 50,000 Units by mouth every 30 (thirty) days.) 12 capsule 3   folic acid  (FOLVITE ) 1 MG tablet Take 1 tablet (1 mg total) by mouth daily. 30 tablet 5   Oxycodone  HCl 10 MG TABS Take 1 tablet (10 mg total) by mouth every 4 (four) hours as needed. 60 tablet 0   mupirocin  ointment (BACTROBAN ) 2 % Apply 1 Application topically 2 (two) times daily. (Patient not taking: Reported on 12/23/2022) 22 g 0   Blood Glucose Monitoring Suppl DEVI 1 each by Does not apply route in the morning and at bedtime. May substitute to any manufacturer covered by patient's insurance. 1 each 0   diclofenac  (VOLTAREN ) 75 MG EC tablet Take 1 tablet (75 mg total) by mouth 2 (two) times daily. (Patient not taking: Reported on 07/11/2022) 50 tablet 2   glipiZIDE  (GLUCOTROL ) 10 MG tablet Take 1 tablet (10 mg total) by mouth daily before breakfast. (Patient not taking: Reported on 06/24/2023) 90 tablet 2   ibuprofen  (ADVIL ) 600 MG tablet Take 1 tablet (600 mg total) by  mouth every 8 (eight) hours as needed. (Patient not taking: Reported on 06/24/2023) 30 tablet 5   No facility-administered medications prior to visit.    Allergies  Allergen Reactions   Aspirin Other (See Comments)    unknown   Sulfa Antibiotics Other (See Comments)    unknown    ROS Review of Systems  Constitutional:  Negative for appetite change, chills, fatigue and fever.  HENT:  Negative for congestion, postnasal drip, rhinorrhea and sneezing.   Respiratory:  Negative for cough, shortness of breath and wheezing.   Cardiovascular:  Negative for chest pain, palpitations and  leg swelling.  Gastrointestinal:  Negative for abdominal pain, constipation, nausea and vomiting.  Genitourinary:  Negative for difficulty urinating, dysuria, flank pain and frequency.  Musculoskeletal:  Positive for arthralgias. Negative for back pain, joint swelling and myalgias.  Skin:  Negative for color change, pallor, rash and wound.  Neurological:  Negative for dizziness, facial asymmetry, weakness, numbness and headaches.  Psychiatric/Behavioral:  Negative for behavioral problems, confusion, self-injury and suicidal ideas.       Objective:     Physical Exam Vitals and nursing note reviewed.  Constitutional:      General: He is not in acute distress.    Appearance: Normal appearance. He is obese. He is not ill-appearing, toxic-appearing or diaphoretic.  Eyes:     General: Scleral icterus present.        Right eye: No discharge.        Left eye: No discharge.     Extraocular Movements: Extraocular movements intact.  Cardiovascular:     Rate and Rhythm: Normal rate and regular rhythm.     Pulses: Normal pulses.     Heart sounds: Normal heart sounds. No murmur heard.    No friction rub. No gallop.  Pulmonary:     Effort: Pulmonary effort is normal. No respiratory distress.     Breath sounds: Normal breath sounds. No stridor. No wheezing, rhonchi or rales.  Chest:     Chest wall: No  tenderness.  Abdominal:     General: There is no distension.     Palpations: Abdomen is soft.     Tenderness: There is no abdominal tenderness. There is no right CVA tenderness, left CVA tenderness or guarding.  Musculoskeletal:        General: No swelling.     Right lower leg: No edema.     Left lower leg: No edema.  Skin:    General: Skin is warm and dry.     Capillary Refill: Capillary refill takes less than 2 seconds.     Coloration: Skin is not jaundiced or pale.     Findings: No bruising, erythema or lesion.  Neurological:     Mental Status: He is alert and oriented to person, place, and time.     Motor: No weakness.     Coordination: Coordination normal.     Gait: Gait normal.  Psychiatric:        Mood and Affect: Mood normal.        Behavior: Behavior normal.        Thought Content: Thought content normal.        Judgment: Judgment normal.     BP 130/81   Pulse 94   Temp (!) 97.3 F (36.3 C)   Wt 158 lb (71.7 kg)   SpO2 95%   BMI 25.50 kg/m  Wt Readings from Last 3 Encounters:  06/24/23 158 lb (71.7 kg)  03/25/23 164 lb (74.4 kg)  12/23/22 167 lb (75.8 kg)    Lab Results  Component Value Date   TSH 1.217 07/30/2022   Lab Results  Component Value Date   WBC 14.7 (H) 03/25/2023   HGB 12.2 (L) 03/25/2023   HCT 36.1 (L) 03/25/2023   MCV 87 03/25/2023   PLT 401 03/25/2023   Lab Results  Component Value Date   NA 134 03/25/2023   K 5.1 03/25/2023   CO2 20 03/25/2023   GLUCOSE 238 (H) 03/25/2023   BUN 11 03/25/2023   CREATININE 0.93 03/25/2023   BILITOT 3.7 (H) 03/25/2023  ALKPHOS 127 (H) 03/25/2023   AST 32 03/25/2023   ALT 30 03/25/2023   PROT 7.8 03/25/2023   ALBUMIN 4.4 03/25/2023   CALCIUM 9.7 03/25/2023   ANIONGAP 10 07/31/2022   EGFR 102 03/25/2023   Lab Results  Component Value Date   CHOL 186 03/25/2023   Lab Results  Component Value Date   HDL 45 03/25/2023   Lab Results  Component Value Date   LDLCALC 128 (H) 03/25/2023    Lab Results  Component Value Date   TRIG 71 03/25/2023   Lab Results  Component Value Date   CHOLHDL 4.1 03/25/2023   Lab Results  Component Value Date   HGBA1C 5.5 07/30/2022      Assessment & Plan:   Problem List Items Addressed This Visit       Cardiovascular and Mediastinum   Essential hypertension   Continue lisinopril  5 mg daily DASH diet and commitment to daily physical activity for a minimum of 30 minutes discussed and encouraged, as a part of hypertension management. The importance of attaining a healthy weight is also discussed.     06/24/2023    9:11 AM 06/24/2023    9:03 AM 03/25/2023    8:36 AM 12/23/2022    9:57 AM 08/12/2022    9:11 AM 08/12/2022    9:00 AM 07/31/2022    9:39 AM  BP/Weight  Systolic BP 130 140 119 132 147 143 128  Diastolic BP 81 82 75 83 83 83 80  Wt. (Lbs)  158 164 167  166.8   BMI  25.5 kg/m2 26.47 kg/m2 26.95 kg/m2  26.92 kg/m2              Endocrine   Uncontrolled type 2 diabetes mellitus with hyperglycemia (HCC)   Glipizide  10 mg daily refilled, encouraged to take medication daily as ordered Continue metformin  1000 mg twice daily Patient counseled on low-carb diet, CBG goals discussed, patient referred for diabetes nutrition education Checking fructosamine level Up-to-date with diabetic eye exam Patient referred to the clinical pharmacist to assist with medication adherence      Relevant Medications   glipiZIDE  (GLUCOTROL ) 10 MG tablet   Other Relevant Orders   Fructosamine   Direct LDL   Ambulatory referral to diabetic education   AMB Referral [facility] Care Management     Musculoskeletal and Integument   Avascular necrosis (HCC)   Followed by orthopedics        Other   Chronic musculoskeletal pain   Relevant Medications   Oxycodone  HCl 10 MG TABS   ibuprofen  (ADVIL ) 600 MG tablet   Sickle cell anemia with pain (HCC)   Sickle cell disease - Continue  Folic acid  1 mg daily, encouraged to consider taking  hydroxyurea 500 mg that was recently ordered by hematologist .we discussed the need for good hydration, monitoring of hydration status, avoidance of heat, cold, stress, and infection triggers.. The patient was reminded of the need to seek medical attention of any symptoms of bleeding, anemia, or infection.    Pulmonary evaluation - Patient denies severe recurrent wheezes, shortness of breath with exercise, or persistent cough. If these symptoms develop, pulmonary function tests with spirometry will be ordered, and if abnormal, plan on referral to Pulmonology for further evaluation.   Eye - High risk of proliferative retinopathy.  He is up-to-date with yearly eye exam     Immunization status - Patient is up-to-date with his vaccines  Acute and chronic painful episodes -has a prescription for ibuprofen  but  does not take the medication, takes oxycodone  10 mg every 4 hours as needed moderate pain we discussed that pt is to receive her Schedule II prescriptions only from us . Pt is also aware that the prescription history is available to us  online through the [facility] CSRS. We reminded the patient that all patients receiving Schedule II narcotics must be seen for follow within the 3 months in the office.  We reviewed the terms of our pain agreement, including the need to keep medicines in a safe locked location away from children or pets, and the need to report excess sedation or constipation, measures to avoid constipation, and policies related to early refills and stolen prescriptions. According to the [place] [facility] program, we have reviewed details related to analgesia, adverse effects, aberrant behaviors.    1 Sickle Cell Panel - 161096 11+Oxyco+Alc+Crt-Bund - folic acid  (FOLVITE ) 1 MG tablet; Take 1 tablet (1 mg total) by mouth daily.  Dispense: 90 tablet; Refill: 2 - Oxycodone  HCl 10 MG TABS; Take 1 tablet (10 mg total) by mouth every 4 (four) hours as needed.  Dispense: 60 tablet; Refill:  0 - ibuprofen  (ADVIL ) 600 MG tablet; Take 1 tablet (600 mg total) by mouth every 8 (eight) hours as needed for mild pain (pain score 1-3).  Dispense: 30 tablet; Refill: 5          Relevant Medications   folic acid  (FOLVITE ) 1 MG tablet   Oxycodone  HCl 10 MG TABS   ibuprofen  (ADVIL ) 600 MG tablet   Other Relevant Orders   Sickle Cell Panel   045409 11+Oxyco+Alc+Crt-Bund   Vitamin D  deficiency   Last vitamin D  Lab Results  Component Value Date   VD25OH 29.7 (L) 03/25/2023  Continue vitamin D  50,000 units once weekly      Dyslipidemia, goal LDL below 70   Lab Results  Component Value Date   CHOL 186 03/25/2023   HDL 45 03/25/2023   LDLCALC 128 (H) 03/25/2023   TRIG 71 03/25/2023   CHOLHDL 4.1 03/25/2023  Currently not on medication Due to elevated bilirubin  checking INR to help determine if it is safe to start crestor         Other Visit Diagnoses       Elevated alkaline phosphatase level    -  Primary   Relevant Orders   INR       Meds ordered this encounter  Medications   glipiZIDE  (GLUCOTROL ) 10 MG tablet    Sig: Take 1 tablet (10 mg total) by mouth daily before breakfast.    Dispense:  90 tablet    Refill:  1    This prescription was filled on 08/06/2022. Any refills authorized will be placed on file.   folic acid  (FOLVITE ) 1 MG tablet    Sig: Take 1 tablet (1 mg total) by mouth daily.    Dispense:  90 tablet    Refill:  2    This prescription was filled on 04/24/2021. Any refills authorized will be placed on file.   Oxycodone  HCl 10 MG TABS    Sig: Take 1 tablet (10 mg total) by mouth every 4 (four) hours as needed.    Dispense:  60 tablet    Refill:  0   ibuprofen  (ADVIL ) 600 MG tablet    Sig: Take 1 tablet (600 mg total) by mouth every 8 (eight) hours as needed for mild pain (pain score 1-3).    Dispense:  30 tablet    Refill:  5    Follow-up: Return in about 3 months (around 09/24/2023) for HTN, DM.    Cambrey Lupi R Thelda Gagan, FNP

## 2023-06-24 NOTE — Assessment & Plan Note (Signed)
 Last vitamin D  Lab Results  Component Value Date   VD25OH 29.7 (L) 03/25/2023  Continue vitamin D  50,000 units once weekly

## 2023-06-24 NOTE — Assessment & Plan Note (Signed)
 Sickle cell disease - Continue  Folic acid  1 mg daily, encouraged to consider taking hydroxyurea 500 mg that was recently ordered by hematologist .we discussed the need for good hydration, monitoring of hydration status, avoidance of heat, cold, stress, and infection triggers.. The patient was reminded of the need to seek medical attention of any symptoms of bleeding, anemia, or infection.    Pulmonary evaluation - Patient denies severe recurrent wheezes, shortness of breath with exercise, or persistent cough. If these symptoms develop, pulmonary function tests with spirometry will be ordered, and if abnormal, plan on referral to Pulmonology for further evaluation.   Eye - High risk of proliferative retinopathy.  He is up-to-date with yearly eye exam     Immunization status - Patient is up-to-date with his vaccines  Acute and chronic painful episodes -has a prescription for ibuprofen  but does not take the medication, takes oxycodone  10 mg every 4 hours as needed moderate pain we discussed that pt is to receive her Schedule II prescriptions only from us . Pt is also aware that the prescription history is available to us  online through the [facility] CSRS. We reminded the patient that all patients receiving Schedule II narcotics must be seen for follow within the 3 months in the office.  We reviewed the terms of our pain agreement, including the need to keep medicines in a safe locked location away from children or pets, and the need to report excess sedation or constipation, measures to avoid constipation, and policies related to early refills and stolen prescriptions. According to the [place] Chronic [facility] Initiative program, we have reviewed details related to analgesia, adverse effects, aberrant behaviors.    1 Sickle Cell Panel - [id number] 11+Oxyco+Alc+Crt-Bund - folic acid  (FOLVITE ) 1 MG tablet; Take 1 tablet (1 mg total) by mouth daily.  Dispense: 90 tablet; Refill: 2 - Oxycodone  HCl 10 MG TABS; Take 1 tablet (10  mg total) by mouth every 4 (four) hours as needed.  Dispense: 60 tablet; Refill: 0 - ibuprofen  (ADVIL ) 600 MG tablet; Take 1 tablet (600 mg total) by mouth every 8 (eight) hours as needed for mild pain (pain score 1-3).  Dispense: 30 tablet; Refill: 5

## 2023-06-24 NOTE — Assessment & Plan Note (Signed)
 Continue lisinopril  5 mg daily DASH diet and commitment to daily physical activity for a minimum of 30 minutes discussed and encouraged, as a part of hypertension management. The importance of attaining a healthy weight is also discussed.     06/24/2023    9:11 AM 06/24/2023    9:03 AM 03/25/2023    8:36 AM 12/23/2022    9:57 AM 08/12/2022    9:11 AM 08/12/2022    9:00 AM 07/31/2022    9:39 AM  BP/Weight  Systolic BP 130 140 119 132 147 143 128  Diastolic BP 81 82 75 83 83 83 80  Wt. (Lbs)  158 164 167  166.8   BMI  25.5 kg/m2 26.47 kg/m2 26.95 kg/m2  26.92 kg/m2

## 2023-06-24 NOTE — Telephone Encounter (Signed)
 Please advise [facility]

## 2023-06-24 NOTE — Assessment & Plan Note (Addendum)
 Glipizide  10 mg daily refilled, encouraged to take medication daily as ordered Continue metformin  1000 mg twice daily Patient counseled on low-carb diet, CBG goals discussed, patient referred for diabetes nutrition education Checking fructosamine level Up-to-date with diabetic eye exam Patient referred to the clinical pharmacist to assist with medication adherence

## 2023-06-25 LAB — CMP14+CBC/D/PLT+FER+RETIC+V...
ALT: 18 IU/L (ref 0–44)
AST: 24 IU/L (ref 0–40)
Albumin: 4.5 g/dL (ref 4.1–5.1)
Alkaline Phosphatase: 122 IU/L — ABNORMAL HIGH (ref 44–121)
BUN/Creatinine Ratio: 7 — ABNORMAL LOW (ref 9–20)
BUN: 6 mg/dL (ref 6–24)
Basophils Absolute: 0.2 10*3/uL (ref 0.0–0.2)
Basos: 1 %
Bilirubin Total: 3.1 mg/dL — ABNORMAL HIGH (ref 0.0–1.2)
CO2: 19 mmol/L — ABNORMAL LOW (ref 20–29)
Calcium: 10.4 mg/dL — ABNORMAL HIGH (ref 8.7–10.2)
Chloride: 95 mmol/L — ABNORMAL LOW (ref 96–106)
Creatinine, Ser: 0.84 mg/dL (ref 0.76–1.27)
EOS (ABSOLUTE): 0.7 10*3/uL — ABNORMAL HIGH (ref 0.0–0.4)
Eos: 3 %
Ferritin: 871 ng/mL — ABNORMAL HIGH (ref 30–400)
Globulin, Total: 3.7 g/dL (ref 1.5–4.5)
Glucose: 385 mg/dL — ABNORMAL HIGH (ref 70–99)
Hematocrit: 42.7 % (ref 37.5–51.0)
Hemoglobin: 14.1 g/dL (ref 13.0–17.7)
Immature Grans (Abs): 0.4 10*3/uL — ABNORMAL HIGH (ref 0.0–0.1)
Immature Granulocytes: 2 %
Lymphocytes Absolute: 4 10*3/uL — ABNORMAL HIGH (ref 0.7–3.1)
Lymphs: 19 %
MCH: 28.8 pg (ref 26.6–33.0)
MCHC: 33 g/dL (ref 31.5–35.7)
MCV: 87 fL (ref 79–97)
Monocytes Absolute: 1 10*3/uL — ABNORMAL HIGH (ref 0.1–0.9)
Monocytes: 5 %
NRBC: 2 % — ABNORMAL HIGH (ref 0–0)
Neutrophils Absolute: 14.2 10*3/uL — ABNORMAL HIGH (ref 1.4–7.0)
Neutrophils: 70 %
Platelets: 469 10*3/uL — ABNORMAL HIGH (ref 150–450)
Potassium: 4.7 mmol/L (ref 3.5–5.2)
RBC: 4.9 x10E6/uL (ref 4.14–5.80)
RDW: 15.4 % (ref 11.6–15.4)
Retic Ct Pct: 6.5 % — ABNORMAL HIGH (ref 0.6–2.6)
Sodium: 132 mmol/L — ABNORMAL LOW (ref 134–144)
Total Protein: 8.2 g/dL (ref 6.0–8.5)
Vit D, 25-Hydroxy: 28.2 ng/mL — ABNORMAL LOW (ref 30.0–100.0)
WBC: 20.4 10*3/uL (ref 3.4–10.8)
eGFR: 108 mL/min/{1.73_m2} (ref >59)

## 2023-06-25 LAB — FRUCTOSAMINE: Fructosamine: 659 umol/L — ABNORMAL HIGH (ref 0–285)

## 2023-06-25 LAB — LDL CHOLESTEROL, DIRECT: LDL Direct: 138 mg/dL — ABNORMAL HIGH (ref 0–99)

## 2023-06-26 ENCOUNTER — Other Ambulatory Visit: Payer: Self-pay | Admitting: Nurse Practitioner

## 2023-06-26 ENCOUNTER — Ambulatory Visit: Payer: Self-pay | Admitting: Nurse Practitioner

## 2023-06-26 DIAGNOSIS — E559 Vitamin D deficiency, unspecified: Secondary | ICD-10-CM

## 2023-06-26 DIAGNOSIS — D5700 Hb-SS disease with crisis, unspecified: Secondary | ICD-10-CM

## 2023-06-26 DIAGNOSIS — R748 Abnormal levels of other serum enzymes: Secondary | ICD-10-CM

## 2023-06-26 DIAGNOSIS — E119 Type 2 diabetes mellitus without complications: Secondary | ICD-10-CM

## 2023-06-26 DIAGNOSIS — E785 Hyperlipidemia, unspecified: Secondary | ICD-10-CM

## 2023-06-26 DIAGNOSIS — D72829 Elevated white blood cell count, unspecified: Secondary | ICD-10-CM

## 2023-06-26 LAB — DRUG SCREEN 764883 11+OXYCO+ALC+CRT-BUND

## 2023-06-26 MED ORDER — ROSUVASTATIN CALCIUM 5 MG PO TABS: 5.0000 mg | ORAL_TABLET | Freq: Every day | ORAL | 1 refills | Status: DC

## 2023-06-26 MED ORDER — METFORMIN HCL 1000 MG PO TABS
1000.0000 mg | ORAL_TABLET | Freq: Two times a day (BID) | ORAL | 2 refills | Status: DC
Start: 2023-06-26 — End: 2023-08-25

## 2023-06-26 MED ORDER — VITAMIN D (ERGOCALCIFEROL) 1.25 MG (50000 UNIT) PO CAPS: 50000.0000 [IU] | ORAL_CAPSULE | ORAL | 3 refills | Status: DC

## 2023-06-27 LAB — DRUG SCREEN 764883 11+OXYCO+ALC+CRT-BUND
Amphetamines, Urine: NEGATIVE ng/mL
BENZODIAZ UR QL: NEGATIVE ng/mL
Barbiturate screen, urine: NEGATIVE ng/mL
Cannabinoid Quant, Ur: NEGATIVE ng/mL
Cocaine (Metab.): NEGATIVE ng/mL
Creatinine, Urine: 46 mg/dL (ref 20.0–300.0)
Ethanol, Urine: NEGATIVE %
Meperidine: NEGATIVE ng/mL
Methadone Screen, Urine: NEGATIVE ng/mL
Nitrite Urine, Quantitative: NEGATIVE ug/mL
OPIATE SCREEN URINE: NEGATIVE ng/mL
PCP Quant, Ur: NEGATIVE ng/mL
Propoxyphene: NEGATIVE ng/mL
Tramadol: NEGATIVE ng/mL
pH, Urine: 5.7 (ref 4.5–8.9)

## 2023-06-27 LAB — OXYCODONE/OXYMORPHONE, CONFIRM
OXYCODONE/OXYMORPH: POSITIVE — AB
OXYCODONE: NEGATIVE
OXYMORPHONE (GC/MS): 367 ng/mL
OXYMORPHONE: POSITIVE — AB

## 2023-06-30 ENCOUNTER — Other Ambulatory Visit

## 2023-06-30 DIAGNOSIS — D72829 Elevated white blood cell count, unspecified: Secondary | ICD-10-CM

## 2023-06-30 DIAGNOSIS — R748 Abnormal levels of other serum enzymes: Secondary | ICD-10-CM

## 2023-06-30 DIAGNOSIS — R17 Unspecified jaundice: Secondary | ICD-10-CM

## 2023-07-01 LAB — PROTIME-INR
INR: 1 (ref 0.9–1.2)
Prothrombin Time: 11.4 s (ref 9.1–12.0)

## 2023-07-01 LAB — CBC WITH DIFFERENTIAL/PLATELET
Basophils Absolute: 0.2 10*3/uL (ref 0.0–0.2)
Basos: 1 %
EOS (ABSOLUTE): 1.1 10*3/uL — ABNORMAL HIGH (ref 0.0–0.4)
Eos: 6 %
Hematocrit: 37.6 % (ref 37.5–51.0)
Hemoglobin: 12.4 g/dL — ABNORMAL LOW (ref 13.0–17.7)
Immature Grans (Abs): 0.3 10*3/uL — ABNORMAL HIGH (ref 0.0–0.1)
Immature Granulocytes: 2 %
Lymphocytes Absolute: 5 10*3/uL — ABNORMAL HIGH (ref 0.7–3.1)
Lymphs: 26 %
MCH: 28.6 pg (ref 26.6–33.0)
MCHC: 33 g/dL (ref 31.5–35.7)
MCV: 87 fL (ref 79–97)
Monocytes Absolute: 1.1 10*3/uL — ABNORMAL HIGH (ref 0.1–0.9)
Monocytes: 6 %
NRBC: 2 % — ABNORMAL HIGH (ref 0–0)
Neutrophils Absolute: 11.5 10*3/uL — ABNORMAL HIGH (ref 1.4–7.0)
Neutrophils: 59 %
Platelets: 428 10*3/uL (ref 150–450)
RBC: 4.34 x10E6/uL (ref 4.14–5.80)
RDW: 15.4 % (ref 11.6–15.4)
WBC: 19.1 10*3/uL — ABNORMAL HIGH (ref 3.4–10.8)

## 2023-07-01 LAB — BASIC METABOLIC PANEL WITH GFR
BUN/Creatinine Ratio: 16 (ref 9–20)
BUN: 16 mg/dL (ref 6–24)
CO2: 20 mmol/L (ref 20–29)
Calcium: 9.9 mg/dL (ref 8.7–10.2)
Chloride: 97 mmol/L (ref 96–106)
Creatinine, Ser: 0.99 mg/dL (ref 0.76–1.27)
Glucose: 364 mg/dL — ABNORMAL HIGH (ref 70–99)
Potassium: 4.8 mmol/L (ref 3.5–5.2)
Sodium: 132 mmol/L — ABNORMAL LOW (ref 134–144)
eGFR: 94 mL/min/{1.73_m2} (ref >59)

## 2023-07-06 ENCOUNTER — Other Ambulatory Visit: Payer: Self-pay

## 2023-07-06 NOTE — Progress Notes (Signed)
 Attempted to contact patient for scheduled appointment for medication management. Left HIPAA compliant message for patient to return my call at their convenience. Will outreach via [employer] as well.   Arthea Larsson, PharmD PGY1 Pharmacy Resident

## 2023-08-17 ENCOUNTER — Ambulatory Visit: Admitting: Dietician

## 2023-08-25 ENCOUNTER — Ambulatory Visit (INDEPENDENT_AMBULATORY_CARE_PROVIDER_SITE_OTHER): Payer: Self-pay

## 2023-08-25 DIAGNOSIS — E119 Type 2 diabetes mellitus without complications: Secondary | ICD-10-CM

## 2023-08-25 DIAGNOSIS — E785 Hyperlipidemia, unspecified: Secondary | ICD-10-CM

## 2023-08-25 MED ORDER — PEN NEEDLES 32G X 4 MM MISC: 3 refills | Status: DC

## 2023-08-25 MED ORDER — METFORMIN HCL ER 500 MG PO TB24: 1000.0000 mg | ORAL_TABLET | Freq: Two times a day (BID) | ORAL | 3 refills | Status: DC

## 2023-08-25 MED ORDER — ROSUVASTATIN CALCIUM 20 MG PO TABS: 20.0000 mg | ORAL_TABLET | Freq: Every day | ORAL | 3 refills | Status: DC

## 2023-08-25 MED ORDER — FREESTYLE LIBRE 3 PLUS SENSOR MISC
3 refills | Status: DC
Start: 2023-08-25 — End: 2023-10-06

## 2023-08-25 MED ORDER — INSULIN GLARGINE 100 UNIT/ML ~~LOC~~ SOLN: 8.0000 [IU] | Freq: Every day | SUBCUTANEOUS | 3 refills | Status: DC

## 2023-08-25 NOTE — Patient Instructions (Addendum)
 It was nice to see you today!  Your goal blood sugar is 80-130 before eating and less than 180 after eating.  Medication Changes:  START Lantus  (insulin  glargine) 8 units once daily in the morning. Take each dose about 24 hours apart.   Stop immediate release (IR) metformin . Start extended release (XR) metformin  1000 mg (2 tablets total) in the morning and the evening)  Stop glipizide   Start rosuvastatin  20 mg once daily  Continue all other medications the same  Pick up the Freestyle Libre 3 plus continuous glucose monitor. Before our next appointment, try to download the [place] app on your phone.     Continue all other medication the same.   Monitor blood sugars at home and keep a log (glucometer or piece of paper) to bring with you to your next visit.  Keep up the good work with diet and exercise. Aim for a diet full of vegetables, fruit and lean meats (chicken, [place], fish). Try to limit salt intake by eating fresh or frozen vegetables (instead of canned), rinse canned vegetables prior to cooking and do not add any additional salt to meals.

## 2023-08-25 NOTE — Progress Notes (Signed)
 08/25/2023 Name: Gregory Bryant MRN: [id number] DOB: 1975-02-03  Chief Complaint  Patient presents with   Diabetes   Hyperlipidemia    Gregory Bryant is a 48 y.o. year old male who was referred for medication management by their primary care provider, Paseda, Folashade R, FNP. They presented for a face to face visit today.   They were referred to the pharmacist by their PCP for assistance in managing diabetes and hyperlipidemia  PMH includes includes HTN, T2DM, sickle cell disease, cholecystectomy (2019), asthma.   Subjective: Patient was last seen by PCP, Gregory Shall, NP on 06/24/23. His fructosamine was increased to 659 umol/L, which correlates to an A1C of 12.8%. He had not taken glipizide  in 2 mo due to not having refills at the pharmacy. He was only taking metformin  1000 mg BID. He denied s/sx of hyperglycemia. BP was 130/81 mmHg without taking lisinopril  that morning. He was prescribed rosuvastatin  5 mg daily for elevated LDL-direct 138 mg/dL. He had to return for repeat labs given an elevated WBC  Today, patient reports doing well. He is taking his medications, but reports his BG have still been consistently elevated > 200 mg/dL. He is not monitoring every day.    Care Team: Primary Care Provider: Paseda, Folashade R, FNP ; Next Scheduled Visit: 09/29/23  Medication Access/Adherence  Current Pharmacy:  [facility] - [place], [place] - [address] [place] [address] Phone: [phone] Fax: [phone]   Patient reports affordability concerns with their medications: No  Patient reports access/transportation concerns to their pharmacy: No  Patient reports adherence concerns with their medications:  Yes  - frequently misses second dose of metformin   Diabetes:  Current medications: glipizide  IR 10 mg daily with breakfast, metformin  IR 1000 mg BID (reports that he frequently forgets the second  dose) Medications tried in the past: N/A  Reports that he does have occasional diarrhea and nausea with metformin , which is better if eats with his dose. Would be interested in switching to XR version.  Current glucose readings:  Using Accu Chek meter; testing a couple times a week Brings a glucometer with him, but it is not the meter that he typically uses - 06/25/23: 4AM 300 mg/dL. Reports yesterday AM in the 200s.   Patient denies hypoglycemic s/sx including dizziness, shakiness, sweating. Patient reports hyperglycemic symptoms including occasional nocturia. Notes that he has been losing some weight. Denies polyuria, polydipsia, polyphagia, nocturia, neuropathy, blurred vision.  Current meal patterns: Only eats 2 meals/day (breakfast/dinner) - skips lunch because he is busy and does not get hungry.  - Breakfast: tomato sandwich this AM - Dinner: Salmon, brown rice (~ 1 cup)   Current physical activity: Walks a lot at work  Current medication access support: Medicaid insurance  Hyperlipidemia/ASCVD Risk Reduction  Current lipid lowering medications: rosuvastatin  5 mg daily (has not started)  ASCVD History: none Risk Factors: T2DM, HLD   Objective:  BP Readings from Last 3 Encounters:  06/24/23 130/81  03/25/23 119/75  12/23/22 132/83    Lab Results  Component Value Date   HGBA1C 5.5 07/30/2022   HGBA1C 5.5 11/05/2021   HGBA1C 5.6 08/25/2020   Fructosamine 06/24/23: 659 umol/L > ~A1C 12.8%     Latest Ref Rng & Units 06/30/2023    2:47 PM 06/24/2023   10:01 AM 03/25/2023    9:15 AM  BMP  Glucose 70 - 99 mg/dL 635  614  761   BUN 6 - 24 mg/dL  16  6  11    Creatinine 0.76 - 1.27 mg/dL 9.00  9.15  9.06   BUN/Creat Ratio 9 - 20 16  7  12    Sodium 134 - 144 mmol/L 132  132  134   Potassium 3.5 - 5.2 mmol/L 4.8  4.7  5.1   Chloride 96 - 106 mmol/L 97  95  100   CO2 20 - 29 mmol/L 20  19  20    Calcium  8.7 - 10.2 mg/dL 9.9  89.5  9.7     Lab Results  Component Value Date    CHOL 186 03/25/2023   HDL 45 03/25/2023   LDLCALC 128 (H) 03/25/2023   LDLDIRECT 138 (H) 06/24/2023   TRIG 71 03/25/2023   CHOLHDL 4.1 03/25/2023    Medications Reviewed Today     Reviewed by Gregory Bryant, RPH (Pharmacist) on 08/25/23 at 1042  Med List Status: <None>   Medication Order Taking? Sig Documenting Provider Last Dose Status Informant  Blood Glucose Monitoring Suppl DEVI [id number]  1 each by Does not apply route 2 (two) times daily. May substitute to any manufacturer covered by patient's insurance. Tilford Bertram HERO, FNP  Active Self, Pharmacy Records   Patient not taking:   Discontinued 11/30/19 1446 Continuous Glucose Sensor (FREESTYLE LIBRE 3 PLUS SENSOR) MISC [id number] Yes Change sensor every 15 days. Paseda, Folashade R, FNP  Active    Patient not taking:   Discontinued 11/30/19 1446 folic acid  (FOLVITE ) 1 MG tablet [id number]  Take 1 tablet (1 mg total) by mouth daily. Paseda, Folashade R, FNP  Active     Discontinued 08/25/23 1014 (Change in therapy)   Glucose Blood (BLOOD GLUCOSE TEST STRIPS) STRP [id number]  1 each by In Vitro route 2 (two) times daily. May substitute to any manufacturer covered by patient's insurance. Paseda, Folashade R, FNP  Active   hydroxyurea (HYDREA) 500 MG capsule [id number] Yes Take 500 mg by mouth daily. [provider]  Active   ibuprofen  (ADVIL ) 600 MG tablet [id number]  Take 1 tablet (600 mg total) by mouth every 8 (eight) hours as needed for mild pain (pain score 1-3). Paseda, Folashade R, FNP  Active   insulin  glargine (LANTUS ) 100 UNIT/ML injection [id number] Yes Inject 0.08 mLs (8 Units total) into the skin daily. May increase up to 20 units daily if instructed by your doctor. Paseda, Folashade R, FNP  Active   Insulin  Pen Needle (PEN NEEDLES) 32G X 4 MM MISC [id number] Yes Use as instructed to inject insulin  once daily Paseda, Folashade R, FNP  Active   lisinopril  (ZESTRIL ) 5 MG tablet [id number] Yes Take 1 tablet (5 mg total) by mouth  daily. Paseda, Folashade R, FNP  Active   magnesium oxide (MAG-OX) 400 MG tablet [id number]  Take 400 mg by mouth daily. [provider]  Active    Discontinued 08/25/23 1015 (Change in therapy)   metFORMIN  (GLUCOPHAGE -XR) 500 MG 24 hr tablet [id number] Yes Take 2 tablets (1,000 mg total) by mouth 2 (two) times daily with a meal. Paseda, Folashade R, FNP  Active   mupirocin  ointment (BACTROBAN ) 2 % [id number]  Apply 1 Application topically 2 (two) times daily.  Patient not taking: Reported on 12/23/2022   Oley Bascom RAMAN, NP  Active Self, Pharmacy Records  ondansetron  (ZOFRAN  ODT) 8 MG disintegrating tablet [id number]  Take 1 tablet (8 mg total) by mouth every 8 (eight) hours as needed for nausea or vomiting. Tilford Bertram HERO, FNP  Active Self, Pharmacy Records  Med Note ROBERTHA, CHANELLE D   Wed Jul 30, 2022 11:45 AM)    Oxycodone  HCl 10 MG TABS [id number]  Take 1 tablet (10 mg total) by mouth every 4 (four) hours as needed. Paseda, Folashade R, FNP  Active   rosuvastatin  (CRESTOR ) 20 MG tablet [id number] Yes Take 1 tablet (20 mg total) by mouth daily. Paseda, Folashade R, FNP  Active    Patient not taking:   Discontinued 08/25/23 1015 (Dose change)   Vitamin D , Ergocalciferol , (DRISDOL ) 1.25 MG (50000 UNIT) CAPS capsule [id number] Yes Take 1 capsule (50,000 Units total) by mouth every 7 (seven) days. Mondays Paseda, Folashade R, FNP  Active              Assessment/Plan:   Diabetes: - Currently uncontrolled with most recent fructosamine of 659 umo/L, correlating with an A1C of 12.8% above goal < 7%. Patient is exhibiting s/sx of over hyperglycemia, therefore would prefer to initiate basal insulin  compared to a GLP-1RA. He also has been losing weight and does not currently have a large appetite. Patient has not been regularly monitoring BG and is on high dose glipizide . Will d/c glipizide  to reduce risk of hypoglycemia as we start insulin . Patient is willing to start monitoring  his BG with a CGM. Will provide set up education at follow-up appt in ~2 weeks. - Last UACR Feb 2025: 20 mg/g - Reviewed long term cardiovascular and renal outcomes of uncontrolled blood sugar - Reviewed goal A1c, goal fasting, and goal 2 hour post prandial glucose - Reviewed hypoglycemia management plan and the rule of 15 - Reviewed dietary modifications including  utilizing the healthy plate method, limiting portion size of carbohydrate foods, increasing intake of protein and non-starchy vegetables. Counseled patient to stay hydrated with water throughout the day. - Reviewed lifestyle modifications including: aiming for 150 minutes of moderate intensity exercise every week.  - Recommend to start insulin  glargine (Lantus ) 8 units once daily - Recommend to stop glipizide  - Recommend to switch metformin  IR to XR 1000 mg BID.   - Recommend to check glucose twice daily: fasting and 2-hr PPG and pick up FL3+ CGM from pharmacy. Will collaborate with CPhT to complete PA for CGM. Counseled patient to bring glucometer or BG log to every appointment. - Next A1C due 09/24/23 (next PCP appt)     Hyperlipidemia/ASCVD Risk Reduction: - Currently uncontrolled with most recent LDL-C of 138 mg/dL above goal < 70 mg/dL given U7IF + comorbidities. High intensity statin indicated. - Reviewed long term complications of uncontrolled cholesterol - Recommend to start rosuvastatin  20 mg daily    Follow Up Plan:  Pharmacist 09/08/23 PCP clinic visit 09/29/23   Lorain Baseman, PharmD [facility] [phone]

## 2023-08-26 ENCOUNTER — Other Ambulatory Visit: Payer: Self-pay

## 2023-08-26 DIAGNOSIS — D5720 Sickle-cell/Hb-C disease without crisis: Secondary | ICD-10-CM | POA: Diagnosis not present

## 2023-09-08 ENCOUNTER — Ambulatory Visit: Payer: Self-pay

## 2023-09-08 NOTE — Progress Notes (Deleted)
 09/08/2023 Name: Gregory Bryant MRN: [id number] DOB: 13-Feb-1975  No chief complaint on file.   Gregory Bryant is a 48 y.o. year old male who was referred for medication management by their primary care provider, Gregory Bryant, Gregory R, FNP. They presented for a face to face visit today.   They were referred to the pharmacist by their PCP for assistance in managing diabetes and hyperlipidemia  PMH includes includes HTN, T2DM, sickle cell disease, cholecystectomy (2019), asthma.   Subjective: Patient was last seen by PCP, Gregory Shall, NP on 06/24/23. His fructosamine was increased to 659 umol/L, which correlates to an A1C of 12.8%. He had not taken glipizide  in 2 mo due to not having refills at the pharmacy. He was only taking metformin  1000 mg BID. He denied s/sx of hyperglycemia. BP was 130/81 mmHg without taking lisinopril  that morning. He was prescribed rosuvastatin  5 mg daily for elevated LDL-direct 138 mg/dL. He was seen by pharmacy in person on 08/25/23. He reported infrequent SMBG, but all values > 200 mg/dL. He endorsed unwanted weight loss and nocturia. He was instructed to stop glipizide  and start Lantus  8 units daily. He was also transitioned to metformin  XR for better GI tolerability. He was agreeable to scheduling a follow-up visit in person to set up FL3+ CGM.   Today, patient reports doing well. ***  Care Team: Primary Care Provider: Paseda, Gregory R, FNP ; Next Scheduled Visit: 10/06/23  Medication Access/Adherence  Current Pharmacy:  [facility] - [place], [place] - [address] [place] [address] Phone: [phone] Fax: [phone]   Patient reports affordability concerns with their medications: No  Patient reports access/transportation concerns to their pharmacy: No  Patient reports adherence concerns with their medications:  Yes  - frequently misses second dose of  metformin   Diabetes:  Current medications: glipizide  IR 10 mg daily with breakfast, metformin  IR 1000 mg BID (reports that he frequently forgets the second dose) Medications tried in the past: N/A  Reports that he does have occasional diarrhea and nausea with metformin , which is better if eats with his dose. Would be interested in switching to XR version.  Current glucose readings:  Using Accu Chek meter; testing a couple times a week Brings a glucometer with him, but it is not the meter that he typically uses - 06/25/23: 4AM 300 mg/dL. Reports yesterday AM in the 200s.   Patient denies hypoglycemic s/sx including dizziness, shakiness, sweating. Patient reports hyperglycemic symptoms including occasional nocturia. Notes that he has been losing some weight. Denies polyuria, polydipsia, polyphagia, nocturia, neuropathy, blurred vision.  Current meal patterns: Only eats 2 meals/day (breakfast/dinner) - skips lunch because he is busy and does not get hungry.  - Breakfast: tomato sandwich this AM - Dinner: Salmon, brown rice (~ 1 cup)   Current physical activity: Walks a lot at work  Current medication access support: Medicaid insurance  Hyperlipidemia/ASCVD Risk Reduction  Current lipid lowering medications: rosuvastatin  5 mg daily (has not started)  ASCVD History: none Risk Factors: T2DM, HLD   Objective:  BP Readings from Last 3 Encounters:  06/24/23 130/81  03/25/23 119/75  12/23/22 132/83    Lab Results  Component Value Date   HGBA1C 5.5 07/30/2022   HGBA1C 5.5 11/05/2021   HGBA1C 5.6 08/25/2020   Fructosamine 06/24/23: 659 umol/L > ~A1C 12.8%     Latest Ref Rng & Units 06/30/2023    2:47 PM 06/24/2023   10:01 AM 03/25/2023  9:15 AM  BMP  Glucose 70 - 99 mg/dL 635  614  761   BUN 6 - 24 mg/dL 16  6  11    Creatinine 0.76 - 1.27 mg/dL 9.00  9.15  9.06   BUN/Creat Ratio 9 - 20 16  7  12    Sodium 134 - 144 mmol/L 132  132  134   Potassium 3.5 - 5.2 mmol/L 4.8  4.7   5.1   Chloride 96 - 106 mmol/L 97  95  100   CO2 20 - 29 mmol/L 20  19  20    Calcium  8.7 - 10.2 mg/dL 9.9  89.5  9.7     Lab Results  Component Value Date   CHOL 186 03/25/2023   HDL 45 03/25/2023   LDLCALC 128 (H) 03/25/2023   LDLDIRECT 138 (H) 06/24/2023   TRIG 71 03/25/2023   CHOLHDL 4.1 03/25/2023    Medications Reviewed Today   Medications were not reviewed in this encounter      Assessment/Plan:   Diabetes: - Currently uncontrolled with most recent fructosamine of 659 umo/L, correlating with an A1C of 12.8% above goal < 7%. Patient is exhibiting s/sx of over hyperglycemia, therefore would prefer to initiate basal insulin  compared to a GLP-1RA. He also has been losing weight and does not currently have a large appetite. Patient has not been regularly monitoring BG and is on high dose glipizide . Will d/c glipizide  to reduce risk of hypoglycemia as we start insulin . Patient is willing to start monitoring his BG with a CGM. Will provide set up education at follow-up appt in ~2 weeks. - Last UACR Feb 2025: 20 mg/g - Reviewed long term cardiovascular and renal outcomes of uncontrolled blood sugar - Reviewed goal A1c, goal fasting, and goal 2 hour post prandial glucose - Reviewed hypoglycemia management plan and the rule of 15 - Reviewed dietary modifications including  utilizing the healthy plate method, limiting portion size of carbohydrate foods, increasing intake of protein and non-starchy vegetables. Counseled patient to stay hydrated with water throughout the day. - Reviewed lifestyle modifications including: aiming for 150 minutes of moderate intensity exercise every week.  - Recommend to start insulin  glargine (Lantus ) 8 units once daily - Recommend to stop glipizide  - Recommend to switch metformin  IR to XR 1000 mg BID.   - Recommend to check glucose twice daily: fasting and 2-hr PPG and pick up FL3+ CGM from pharmacy. Will collaborate with CPhT to complete PA for CGM.  Counseled patient to bring glucometer or BG log to every appointment. - Next A1C due 09/24/23 (next PCP appt)     Hyperlipidemia/ASCVD Risk Reduction: - Currently uncontrolled with most recent LDL-C of 138 mg/dL above goal < 70 mg/dL given U7IF + comorbidities. High intensity statin indicated. - Reviewed long term complications of uncontrolled cholesterol - Recommend to start rosuvastatin  20 mg daily    Follow Up Plan:  Pharmacist *** PCP clinic visit 09/29/23   Lorain Baseman, PharmD [facility] [phone]

## 2023-09-09 ENCOUNTER — Ambulatory Visit: Payer: Self-pay

## 2023-09-14 ENCOUNTER — Other Ambulatory Visit: Payer: Self-pay | Admitting: Nurse Practitioner

## 2023-09-14 DIAGNOSIS — E1165 Type 2 diabetes mellitus with hyperglycemia: Secondary | ICD-10-CM

## 2023-09-24 ENCOUNTER — Telehealth: Payer: Self-pay | Admitting: Pharmacy Technician

## 2023-09-24 NOTE — Progress Notes (Signed)
   09/24/2023  Patient ID: Gregory Bryant, male   DOB: [birth year], 48 y.o.   MRN: [id number]  Patient engaged with clinical pharmacist for management of diabetes on 08/25/2023. Outreach by [employer] technician was requested.   Outreached patient to discuss diabetes medication management. Left voicemail for patient to return my call at their convenience.   Clayton Jarmon, CPhT [facility] Office: [phone] Email: [email or web address]

## 2023-09-29 ENCOUNTER — Ambulatory Visit: Payer: Self-pay | Admitting: Nurse Practitioner

## 2023-09-29 ENCOUNTER — Telehealth: Payer: Self-pay | Admitting: Pharmacy Technician

## 2023-09-29 NOTE — Progress Notes (Signed)
   09/29/2023  Patient ID: Gregory Bryant, male   DOB: 1975/04/30, 48 y.o.   MRN: [id number]  Patient engaged with clinical pharmacist for management of diabetes on 08/25/2023. Outreach by [employer] technician was requested.   Outreached patient to discuss diabetes medication management. Left voicemail for patient to return my call at their convenience.    Gregory Bryant, CPhT [facility] Office: [phone] Email: [email or web address]

## 2023-10-01 ENCOUNTER — Telehealth: Payer: Self-pay | Admitting: Pharmacy Technician

## 2023-10-01 NOTE — Progress Notes (Signed)
   10/01/2023  Patient ID: Gregory Bryant, male   DOB: 1975/03/09, 48 y.o.   MRN: [id number]  Patient engaged with clinical pharmacist for management of diabetes on 08/25/2023. Outreach by [employer] technician was requested.   Outreached patient to discuss diabetes medication management. Left voicemail for patient to return my call at their convenience.   Jovanka Westgate, CPhT [facility] Office: [phone] Email: [email or web address]

## 2023-10-05 ENCOUNTER — Telehealth: Payer: Self-pay | Admitting: Pharmacy Technician

## 2023-10-05 ENCOUNTER — Ambulatory Visit: Admitting: Dietician

## 2023-10-05 NOTE — Progress Notes (Signed)
   10/05/2023  Patient ID: Gregory Bryant, male   DOB: 1975-07-04, 47 y.o.   MRN: [id number]  Patient engaged with clinical pharmacist for management of diabetes on 08/25/2023. Outreach by [employer] technician was requested.   Outreached patient to discuss diabetes medication management. Left voicemail for patient to return my call at their convenience.   [name], CPhT [facility] Office: [phone] Email: [email or web address]

## 2023-10-06 ENCOUNTER — Encounter: Payer: Self-pay | Admitting: Nurse Practitioner

## 2023-10-06 ENCOUNTER — Other Ambulatory Visit: Payer: Self-pay

## 2023-10-06 ENCOUNTER — Telehealth: Payer: Self-pay

## 2023-10-06 ENCOUNTER — Ambulatory Visit (INDEPENDENT_AMBULATORY_CARE_PROVIDER_SITE_OTHER): Payer: Self-pay | Admitting: Nurse Practitioner

## 2023-10-06 VITALS — BP 141/72 | HR 70 | Temp 97.3°F | Wt 164.0 lb

## 2023-10-06 DIAGNOSIS — I10 Essential (primary) hypertension: Secondary | ICD-10-CM

## 2023-10-06 DIAGNOSIS — G8929 Other chronic pain: Secondary | ICD-10-CM

## 2023-10-06 DIAGNOSIS — E1165 Type 2 diabetes mellitus with hyperglycemia: Secondary | ICD-10-CM | POA: Diagnosis not present

## 2023-10-06 DIAGNOSIS — E785 Hyperlipidemia, unspecified: Secondary | ICD-10-CM | POA: Diagnosis not present

## 2023-10-06 DIAGNOSIS — Z5181 Encounter for therapeutic drug level monitoring: Secondary | ICD-10-CM

## 2023-10-06 DIAGNOSIS — R051 Acute cough: Secondary | ICD-10-CM | POA: Diagnosis not present

## 2023-10-06 DIAGNOSIS — E559 Vitamin D deficiency, unspecified: Secondary | ICD-10-CM

## 2023-10-06 DIAGNOSIS — D5700 Hb-SS disease with crisis, unspecified: Secondary | ICD-10-CM

## 2023-10-06 DIAGNOSIS — M7918 Myalgia, other site: Secondary | ICD-10-CM

## 2023-10-06 MED ORDER — METFORMIN HCL ER 500 MG PO TB24: 1000.0000 mg | ORAL_TABLET | Freq: Two times a day (BID) | ORAL | 3 refills | Status: AC

## 2023-10-06 MED ORDER — PEN NEEDLES 32G X 4 MM MISC: 3 refills | Status: AC

## 2023-10-06 MED ORDER — FREESTYLE LIBRE 3 PLUS SENSOR MISC: 3 refills | Status: AC

## 2023-10-06 MED ORDER — ROSUVASTATIN CALCIUM 20 MG PO TABS: 20.0000 mg | ORAL_TABLET | Freq: Every day | ORAL | 3 refills | Status: DC

## 2023-10-06 MED ORDER — FREESTYLE LIBRE 3 READER DEVI: 0 refills | Status: AC

## 2023-10-06 MED ORDER — LISINOPRIL 10 MG PO TABS: 10.0000 mg | ORAL_TABLET | Freq: Every day | ORAL | 1 refills | Status: DC

## 2023-10-06 MED ORDER — INSULIN GLARGINE 100 UNIT/ML ~~LOC~~ SOLN: 8.0000 [IU] | Freq: Every day | SUBCUTANEOUS | 3 refills | Status: AC

## 2023-10-06 NOTE — Assessment & Plan Note (Signed)
 Type 2 diabetes mellitus with hyperglycemia Blood glucose at 280 mg/dL, exceeding target levels. Continued sugary drink consumption increases stroke and heart disease risk. Start Lantus  8 units daily as ordered continue metformin  5000 mg twice daily - Encourage dietary modifications, reducing sugary drinks and high-carbohydrate foods. - Advise drinking water instead of sugary drinks. - Reinforce maintaining blood glucose within target range. - Follow up with clinical pharmacist for diabetes management. - Provide educational materials on diabetes nutrition. Advised to schedule an appointment with a nutritionist for diabetes nutrition education contact information provided Follow-up with the clinical pharmacist in 4 weeks as planned Check fructosamine level at next visit

## 2023-10-06 NOTE — Patient Instructions (Addendum)
 Please call the nutritionist to schedule an appointment for diabetes nutrition education [phone]  It was nice to see you today!  Your goal blood sugar is 80-130 before eating and less than 180 after eating. If you see a blood sugar less than 70 mg/dL or frequently feel dizzy, shaky, or sweaty let us  know.  Medication Changes: START  insulin  glargine (Lantus ) 8 units daily. Inject at the same time every day  Continue metformin   STOP glipizide   Increase lisinopril  to 10 mg daily  START rosuvastatin  20 mg daily  Continue all other medication the same.   Monitor blood sugars at home and keep a log (glucometer or piece of paper) to bring with you to your next visit.  PICK UP continuous glucose monitor (CGM - Freestyle Libre) supplies and bring to your next appointment with me on October 14th.   Keep up the good work with diet and exercise. Aim for a diet full of vegetables, fruit and lean meats (chicken, [place], fish). Try to limit salt intake by eating fresh or frozen vegetables (instead of canned), rinse canned vegetables prior to cooking and do not add any additional salt to meals.

## 2023-10-06 NOTE — Assessment & Plan Note (Addendum)
 BP Readings from Last 3 Encounters:  10/06/23 (!) 141/72  06/24/23 130/81  03/25/23 119/75    Blood pressure at 141/72 mmHg, above target for diabetes patients, increasing cardiovascular risk. Increase lisinopril  to 10 mg daily Follow-up with the clinical pharmacist in 4 weeks Appreciates collaboration with the clinical pharmacist

## 2023-10-06 NOTE — Assessment & Plan Note (Signed)
 Continue ibuprofen  800 mg 3 times daily as needed for mild, oxycodone  10 mg every 4 hours as needed for moderate pain.

## 2023-10-06 NOTE — Assessment & Plan Note (Signed)
  Cough for two days, likely due to exposure to colds. COVID-19 and flu tests negative. - Continue NyQuil for symptom relief. - Ensure adequate hydration.

## 2023-10-06 NOTE — Assessment & Plan Note (Addendum)
 Sickle cell anemia with pain Infrequent crises, not on hydroxyurea due to fear. Under hematologist care, no recent hospitalizations. - Encourage starting hydroxyurea  - Advise maintaining hydration with at least 64 ounces of water daily. - Avoid triggers such as infections, extreme temperatures, and dehydration. - Seek immediate medical attention if signs of crisis occur. - Maintain follow-up with hematologist.   Continue ibuprofen  800 mg 3 times daily as needed mild pain, oxycodone  10 mg every 4 hours as needed for moderate pain

## 2023-10-06 NOTE — Assessment & Plan Note (Signed)
  Has not started Crestor  (rosuvastatin ) 20 mg. - Begin taking Crestor  20 mg as prescribed. Lipid panel at next visit

## 2023-10-06 NOTE — Telephone Encounter (Signed)
 Consulted patient in room after PCP appointment. Patient had not been able to pick up Lantus  or CGM supplies as previously instructed. Collaborated with PCP to send orders to new pharmacy that could fill his prescriptions on his Medicaid insurance.   T2DM:  - Start insulin  glargine (Lantus ) 8 units daily - Stop glipizide  - Switch metformin  IR to XR 1000 mg BID - Pick up supplies for FL3+ CGM and bring to next appointment - Repeat fructosamine at follow-up  HTN:  - Increase lisinopril  to 10 mg daily - Repeat BMP at follow-up  HLD:  - Start rosuvastatin  20 mg daily as previously prescribed.   See PCP note from 10/06/23 for additional details.  Lorain Baseman, PharmD [facility] [phone]

## 2023-10-06 NOTE — Progress Notes (Signed)
 Established Patient Office Visit  Subjective:  Patient ID: Gregory Bryant, male    DOB: 1975/05/10  Age: 48 y.o. MRN: [id number]  CC:  Chief Complaint  Patient presents with   Hypertension   Sickle Cell Anemia   Diabetes    HPI   Discussed the use of AI scribe software for clinical note transcription with the patient, who gave verbal consent to proceed.  History of Present Illness Gregory Bryant is a 48 year old male  has a past medical history of Avascular necrosis of humeral head (HCC) (03/30/2017), Diabetes mellitus without complication (HCC), Dyslipidemia, goal LDL below 70 (06/24/2023), Essential hypertension (03/25/2023), and Sickle cell anemia (HCC).   who presents with a cough and medication management issues. He is accompanied by his mother.  He has had a cough for the past two days, which he attributes to exposure from his nephews who had colds. No fever, chills, chest pain, or shortness of breath. He has been taking NyQuil at night, which is helping with the symptoms. COVID-19 and flu tests were negative today  He has diabetes and reports difficulty managing his medications due to issues with his pharmacy, leading to a switch to [facility] on [address] He has not yet received his Lantus  and reports a blood sugar level of 280 mg/dL this morning. His diet includes juice, which he believes contributes to his elevated blood sugar levels.  He was referred for diabetes nutrition education body weight will be able to reach him. On metformin  1000 mg twice daily,  He has not started Crestor  yet but plans to begin today    He has hypertension. He is currently taking lisinopril  5 mg daily.  He has a history of sickle cell disease but has not been taking hydroxyurea due to fear of the medication. He reports not having frequent sickle cell crises and has not been hospitalized since his last visit. He continues to see a hematologist in [facility]. His  current medications include folic acid  1 mg daily, ibuprofen  600 mg every eight hours as needed for mild pain , oxycodone  10 mg every four hours as needed for moderate pain, He last took oxycodone  last month and reports no current pain.    Labs done at hematology office reviewed Hemoglobin was stable   collection Time: 08/26/23 9:17 AM  Result Value Ref Range  Reticulocyte Absolute 267.3 (H) 25.0 - 75.0 10*3/uL  Reticulocyte % 6.3 (H) 0.5 - 2.2 %  Comprehensive Metabolic Panel  Collection Time: 08/26/23 9:17 AM  Result Value Ref Range  Sodium 135 (L) 136 - 145 mmol/L  Potassium 4.2 3.4 - 4.5 mmol/L  Chloride 103 98 - 107 mmol/L  CO2 28 21 - 31 mmol/L  Anion Gap 4 (L) 6 - 14 mmol/L  Glucose, Random 311 (H) 70 - 99 mg/dL  Blood Urea Nitrogen (BUN) 10 7 - 25 mg/dL  Creatinine 9.27 9.29 - 1.30 mg/dL  eGFR >09 >40 fO/fpw/8.26f7  Albumin 4.1 3.5 - 5.7 g/dL  Total Protein 7.5 6.4 - 8.9 g/dL  Bilirubin, Total 3.0 (H) 0.3 - 1.0 mg/dL  Alkaline Phosphatase (ALP) 93 34 - 104 U/L  Aspartate Aminotransferase (AST) 17 13 - 39 U/L  Alanine Aminotransferase (ALT) 15 7 - 52 U/L  Calcium  9.3 8.6 - 10.3 mg/dL  BUN/Creatinine Ratio  Lactate Dehydrogenase (LDH)  Collection Time: 08/26/23 9:17 AM  Result Value Ref Range  Lactate Dehydrogenase (LDH) 179 140 - 271 U/L   Assessment and Plan Assessment & Plan  Past Medical History:  Diagnosis Date   Avascular necrosis of humeral head (HCC) 03/30/2017   Diabetes mellitus without complication (HCC)    Dyslipidemia, goal LDL below 70 06/24/2023   Essential hypertension 03/25/2023   Sickle cell anemia (HCC)     Past Surgical History:  Procedure Laterality Date   CHOLECYSTECTOMY      Family History  Problem Relation Age of Onset   Diabetes Mother     Social History   Socioeconomic History   Marital status: Married    Spouse name: Not on file   Number of children: 2   Years of education: Not on file   Highest education level:  Not on file  Occupational History   Not on file  Tobacco Use   Smoking status: Never   Smokeless tobacco: Never  Vaping Use   Vaping status: Never Used  Substance and Sexual Activity   Alcohol use: No   Drug use: No   Sexual activity: Not Currently  Other Topics Concern   Not on file  Social History Narrative   Lives with his wife    Social Drivers of Health   Financial Resource Strain: Medium Risk (07/09/2022)   Overall Financial Resource Strain (CARDIA)    Difficulty of Paying Living Expenses: Somewhat hard  Food Insecurity: Food Insecurity Present (07/29/2022)   Hunger Vital Sign    Worried About Running Out of Food in the Last Year: Sometimes true    Ran Out of Food in the Last Year: Sometimes true  Transportation Needs: No Transportation Needs (07/29/2022)   PRAPARE - Administrator, Civil Service (Medical): No    Lack of Transportation (Non-Medical): No  Physical Activity: Sufficiently Active (07/09/2022)   Exercise Vital Sign    Days of Exercise per Week: 7 days    Minutes of Exercise per Session: 150+ min  Stress: No Stress Concern Present (07/09/2022)   [employer] of Occupational Health - Occupational Stress Questionnaire    Feeling of Stress : Only a little  Social Connections: Moderately Integrated (07/09/2022)   Social Connection and Isolation Panel    Frequency of Communication with Friends and Family: More than three times a week    Frequency of Social Gatherings with Friends and Family: Once a week    Attends Religious Services: More than 4 times per year    Active Member of [employer] or Organizations: Yes    Attends Banker Meetings: 1 to 4 times per year    Marital Status: Separated  Intimate Partner Violence: Not At Risk (07/29/2022)   Humiliation, Afraid, Rape, and Kick questionnaire    Fear of Current or Ex-Partner: No    Emotionally Abused: No    Physically Abused: No    Sexually Abused: No    Outpatient Medications Prior to  Visit  Medication Sig Dispense Refill   Blood Glucose Monitoring Suppl DEVI 1 each by Does not apply route 2 (two) times daily. May substitute to any manufacturer covered by patient's insurance. 1 each 0   folic acid  (FOLVITE ) 1 MG tablet Take 1 tablet (1 mg total) by mouth daily. 90 tablet 2   Glucose Blood (BLOOD GLUCOSE TEST STRIPS) STRP 1 each by In Vitro route 2 (two) times daily. May substitute to any manufacturer covered by patient's insurance. 200 strip 4   ibuprofen  (ADVIL ) 600 MG tablet Take 1 tablet (600 mg total) by mouth every 8 (eight) hours as needed for mild pain (pain score 1-3). 30 tablet  5   magnesium oxide (MAG-OX) 400 MG tablet Take 400 mg by mouth daily.     ondansetron  (ZOFRAN  ODT) 8 MG disintegrating tablet Take 1 tablet (8 mg total) by mouth every 8 (eight) hours as needed for nausea or vomiting. 20 tablet 0   Oxycodone  HCl 10 MG TABS Take 1 tablet (10 mg total) by mouth every 4 (four) hours as needed. 60 tablet 0   Vitamin D , Ergocalciferol , (DRISDOL ) 1.25 MG (50000 UNIT) CAPS capsule Take 1 capsule (50,000 Units total) by mouth every 7 (seven) days. Mondays 12 capsule 3   Insulin  Pen Needle (PEN NEEDLES) 32G X 4 MM MISC Use as instructed to inject insulin  once daily 100 each 3   lisinopril  (ZESTRIL ) 5 MG tablet Take 1 tablet (5 mg total) by mouth daily. 90 tablet 1   metFORMIN  (GLUCOPHAGE -XR) 500 MG 24 hr tablet Take 2 tablets (1,000 mg total) by mouth 2 (two) times daily with a meal. 360 tablet 3   hydroxyurea (HYDREA) 500 MG capsule Take 500 mg by mouth daily. (Patient not taking: Reported on 10/06/2023)     mupirocin  ointment (BACTROBAN ) 2 % Apply 1 Application topically 2 (two) times daily. (Patient not taking: Reported on 10/06/2023) 22 g 0   Continuous Glucose Sensor (FREESTYLE LIBRE 3 PLUS SENSOR) MISC Change sensor every 15 days. (Patient not taking: Reported on 10/06/2023) 6 each 3   insulin  glargine (LANTUS ) 100 UNIT/ML injection Inject 0.08 mLs (8 Units total) into the  skin daily. May increase up to 20 units daily if instructed by your doctor. (Patient not taking: Reported on 10/06/2023) 15 mL 3   rosuvastatin  (CRESTOR ) 20 MG tablet Take 1 tablet (20 mg total) by mouth daily. (Patient not taking: Reported on 10/06/2023) 90 tablet 3   No facility-administered medications prior to visit.    Allergies  Allergen Reactions   Aspirin Other (See Comments)    unknown   Sulfa Antibiotics Other (See Comments)    unknown    ROS Review of Systems  Constitutional:  Negative for appetite change, chills, fatigue and fever.  HENT:  Negative for congestion, postnasal drip, rhinorrhea and sneezing.   Respiratory:  Negative for cough, shortness of breath and wheezing.   Cardiovascular:  Negative for chest pain, palpitations and leg swelling.  Gastrointestinal:  Negative for abdominal pain, constipation, nausea and vomiting.  Genitourinary:  Negative for difficulty urinating, dysuria, flank pain and frequency.  Musculoskeletal:  Negative for arthralgias, back pain, joint swelling and myalgias.  Skin:  Negative for color change, pallor, rash and wound.  Neurological:  Negative for dizziness, facial asymmetry, weakness, numbness and headaches.  Psychiatric/Behavioral:  Negative for behavioral problems, confusion, self-injury and suicidal ideas.       Objective:    Physical Exam Vitals and nursing note reviewed.  Constitutional:      General: He is not in acute distress.    Appearance: Normal appearance. He is not ill-appearing, toxic-appearing or diaphoretic.  HENT:     Mouth/Throat:     Mouth: Mucous membranes are moist.     Pharynx: Oropharynx is clear. No oropharyngeal exudate or posterior oropharyngeal erythema.  Eyes:     General: Scleral icterus present.        Right eye: No discharge.        Left eye: No discharge.     Extraocular Movements: Extraocular movements intact.  Cardiovascular:     Rate and Rhythm: Normal rate and regular rhythm.     Pulses:  Normal pulses.  Heart sounds: Normal heart sounds. No murmur heard.    No friction rub. No gallop.  Pulmonary:     Effort: Pulmonary effort is normal. No respiratory distress.     Breath sounds: Normal breath sounds. No stridor. No wheezing, rhonchi or rales.  Chest:     Chest wall: No tenderness.  Abdominal:     General: There is no distension.     Palpations: Abdomen is soft.     Tenderness: There is no abdominal tenderness. There is no right CVA tenderness, left CVA tenderness or guarding.  Musculoskeletal:        General: No swelling, tenderness, deformity or signs of injury.     Right lower leg: No edema.     Left lower leg: No edema.  Skin:    General: Skin is warm and dry.     Capillary Refill: Capillary refill takes less than 2 seconds.     Coloration: Skin is not jaundiced or pale.     Findings: No bruising, erythema or lesion.  Neurological:     Mental Status: He is alert and oriented to person, place, and time.     Motor: No weakness.     Coordination: Coordination normal.     Gait: Gait normal.  Psychiatric:        Mood and Affect: Mood normal.        Behavior: Behavior normal.        Thought Content: Thought content normal.        Judgment: Judgment normal.     BP (!) 141/72   Pulse 70   Temp (!) 97.3 F (36.3 C)   Wt 164 lb (74.4 kg)   SpO2 98%   BMI 26.47 kg/m  Wt Readings from Last 3 Encounters:  10/06/23 164 lb (74.4 kg)  06/24/23 158 lb (71.7 kg)  03/25/23 164 lb (74.4 kg)    Lab Results  Component Value Date   TSH 1.217 07/30/2022   Lab Results  Component Value Date   WBC 19.1 (H) 06/30/2023   HGB 12.4 (L) 06/30/2023   HCT 37.6 06/30/2023   MCV 87 06/30/2023   PLT 428 06/30/2023   Lab Results  Component Value Date   NA 132 (L) 06/30/2023   K 4.8 06/30/2023   CO2 20 06/30/2023   GLUCOSE 364 (H) 06/30/2023   BUN 16 06/30/2023   CREATININE 0.99 06/30/2023   BILITOT 3.1 (H) 06/24/2023   ALKPHOS 122 (H) 06/24/2023   AST 24  06/24/2023   ALT 18 06/24/2023   PROT 8.2 06/24/2023   ALBUMIN 4.5 06/24/2023   CALCIUM  9.9 06/30/2023   ANIONGAP 10 07/31/2022   EGFR 94 06/30/2023   Lab Results  Component Value Date   CHOL 186 03/25/2023   Lab Results  Component Value Date   HDL 45 03/25/2023   Lab Results  Component Value Date   LDLCALC 128 (H) 03/25/2023   Lab Results  Component Value Date   TRIG 71 03/25/2023   Lab Results  Component Value Date   CHOLHDL 4.1 03/25/2023   Lab Results  Component Value Date   HGBA1C 5.5 07/30/2022      Assessment & Plan:   Problem List Items Addressed This Visit       Cardiovascular and Mediastinum   Essential hypertension   BP Readings from Last 3 Encounters:  10/06/23 (!) 141/72  06/24/23 130/81  03/25/23 119/75    Blood pressure at 141/72 mmHg, above target for diabetes patients, increasing cardiovascular  risk. Increase lisinopril  to 10 mg daily Follow-up with the clinical pharmacist in 4 weeks Appreciates collaboration with the clinical pharmacist       Relevant Medications   lisinopril  (ZESTRIL ) 10 MG tablet   rosuvastatin  (CRESTOR ) 20 MG tablet     Endocrine   Uncontrolled type 2 diabetes mellitus with hyperglycemia (HCC)   Type 2 diabetes mellitus with hyperglycemia Blood glucose at 280 mg/dL, exceeding target levels. Continued sugary drink consumption increases stroke and heart disease risk. Start Lantus  8 units daily as ordered continue metformin  5000 mg twice daily - Encourage dietary modifications, reducing sugary drinks and high-carbohydrate foods. - Advise drinking water instead of sugary drinks. - Reinforce maintaining blood glucose within target range. - Follow up with clinical pharmacist for diabetes management. - Provide educational materials on diabetes nutrition. Advised to schedule an appointment with a nutritionist for diabetes nutrition education contact information provided Follow-up with the clinical pharmacist in 4 weeks  as planned Check fructosamine level at next visit       Relevant Medications   Continuous Glucose Sensor (FREESTYLE LIBRE 3 PLUS SENSOR) MISC   insulin  glargine (LANTUS ) 100 UNIT/ML injection   Insulin  Pen Needle (PEN NEEDLES) 32G X 4 MM MISC   lisinopril  (ZESTRIL ) 10 MG tablet   metFORMIN  (GLUCOPHAGE -XR) 500 MG 24 hr tablet   rosuvastatin  (CRESTOR ) 20 MG tablet   Continuous Glucose Receiver (FREESTYLE LIBRE 3 READER) DEVI   Other Relevant Orders   Fructosamine     Other   Chronic musculoskeletal pain   Continue ibuprofen  800 mg 3 times daily as needed for mild, oxycodone  10 mg every 4 hours as needed for moderate pain.      Relevant Orders   ToxAssure Flex 15, Ur   Sickle cell anemia with pain (HCC)   Sickle cell anemia with pain Infrequent crises, not on hydroxyurea due to fear. Under hematologist care, no recent hospitalizations. - Encourage starting hydroxyurea  - Advise maintaining hydration with at least 64 ounces of water daily. - Avoid triggers such as infections, extreme temperatures, and dehydration. - Seek immediate medical attention if signs of crisis occur. - Maintain follow-up with hematologist.   Continue ibuprofen  800 mg 3 times daily as needed mild pain, oxycodone  10 mg every 4 hours as needed for moderate pain      Relevant Orders   ToxAssure Flex 15, Ur   Vitamin D  deficiency   Recent Vitamin D  deficiency level 19.1 Taking vitamin D  once a month instead of prescribed once weekly regimen. - Correct vitamin D  50,000 units  once weekly as prescribe.      Dyslipidemia, goal LDL below 70    Has not started Crestor  (rosuvastatin ) 20 mg. - Begin taking Crestor  20 mg as prescribed. Lipid panel at next visit       Relevant Medications   lisinopril  (ZESTRIL ) 10 MG tablet   rosuvastatin  (CRESTOR ) 20 MG tablet   Acute cough - Primary    Cough for two days, likely due to exposure to colds. COVID-19 and flu tests negative. - Continue NyQuil for symptom  relief. - Ensure adequate hydration.       Relevant Orders   POC COVID-19 BinaxNow   POCT Influenza A/B   Other Visit Diagnoses       Encounter for therapeutic drug monitoring           Meds ordered this encounter  Medications   Continuous Glucose Sensor (FREESTYLE LIBRE 3 PLUS SENSOR) MISC    Sig: Change sensor every  15 days.    Dispense:  6 each    Refill:  3   insulin  glargine (LANTUS ) 100 UNIT/ML injection    Sig: Inject 0.08 mLs (8 Units total) into the skin daily. May increase up to 20 units daily if instructed by your doctor.    Dispense:  15 mL    Refill:  3   Insulin  Pen Needle (PEN NEEDLES) 32G X 4 MM MISC    Sig: Use as instructed to inject insulin  once daily    Dispense:  100 each    Refill:  3   lisinopril  (ZESTRIL ) 10 MG tablet    Sig: Take 1 tablet (10 mg total) by mouth daily.    Dispense:  90 tablet    Refill:  1   metFORMIN  (GLUCOPHAGE -XR) 500 MG 24 hr tablet    Sig: Take 2 tablets (1,000 mg total) by mouth 2 (two) times daily with a meal.    Dispense:  360 tablet    Refill:  3   rosuvastatin  (CRESTOR ) 20 MG tablet    Sig: Take 1 tablet (20 mg total) by mouth daily.    Dispense:  90 tablet    Refill:  3   Continuous Glucose Receiver (FREESTYLE LIBRE 3 READER) DEVI    Sig: Use as directed to monitor blood sugar with freestyle libre 3 CGM    Dispense:  1 each    Refill:  0    Follow-up: Return in about 3 months (around 01/05/2024) for HTN, HYPERLIPIDEMIA, DM.    Zaydn Gutridge R Kainoah Bartosiewicz, FNP

## 2023-10-06 NOTE — Assessment & Plan Note (Addendum)
 Recent Vitamin D  deficiency level 19.1 Taking vitamin D  once a month instead of prescribed once weekly regimen. - Correct vitamin D  50,000 units  once weekly as prescribe.

## 2023-10-07 LAB — POCT INFLUENZA A/B
Influenza A, POC: NEGATIVE
Influenza B, POC: NEGATIVE

## 2023-10-07 LAB — POC COVID19 BINAXNOW: SARS Coronavirus 2 Ag: NEGATIVE

## 2023-10-07 NOTE — Addendum Note (Signed)
 Addended by: VICTORY IHA on: 10/07/2023 08:04 AM   Modules accepted: Orders

## 2023-10-10 LAB — TOXASSURE FLEX 15, UR
6-ACETYLMORPHINE IA: NEGATIVE ng/mL
7-aminoclonazepam: NOT DETECTED ng/mg{creat}
Alpha-hydroxyalprazolam: NOT DETECTED ng/mg{creat}
Alpha-hydroxymidazolam: NOT DETECTED ng/mg{creat}
Alpha-hydroxytriazolam: NOT DETECTED ng/mg{creat}
Alprazolam: NOT DETECTED ng/mg{creat}
Amino Chloropyridine: NOT DETECTED
BARBITURATES IA: NEGATIVE ng/mL
Buprenorphine: NOT DETECTED ng/mg{creat}
CANNABINOIDS IA: NEGATIVE ng/mL
COCAINE METABOLITE IA: NEGATIVE ng/mL
Clonazepam: NOT DETECTED ng/mg{creat}
Creatinine: 39 mg/dL (ref >=20)
Desalkylflurazepam: NOT DETECTED ng/mg{creat}
Desmethyldiazepam: NOT DETECTED ng/mg{creat}
Desmethylflunitrazepam: NOT DETECTED ng/mg{creat}
Diazepam: NOT DETECTED ng/mg{creat}
ETHANOL BIOMARKERS IA: NEGATIVE ng/mL
Fentanyl: NOT DETECTED ng/mg{creat}
Flunitrazepam: NOT DETECTED ng/mg{creat}
Lorazepam: NOT DETECTED ng/mg{creat}
Methylphenidate: NOT DETECTED
Midazolam: NOT DETECTED ng/mg{creat}
Norbuprenorphine: NOT DETECTED ng/mg{creat}
Norfentanyl: NOT DETECTED ng/mg{creat}
OPIATE CLASS IA: NEGATIVE ng/mL
OXYCODONE CLASS IA: NEGATIVE ng/mL
Oxazepam: NOT DETECTED ng/mg{creat}
Pregabalin: NOT DETECTED
Ritalinic Acid: NOT DETECTED
TRAMADOL IA: NEGATIVE ng/mL
Temazepam: NOT DETECTED ng/mg{creat}
Zaleplon: NOT DETECTED
Zolpidem Acid: NOT DETECTED
Zolpidem: NOT DETECTED
Zopiclone/Eszopiclone: NOT DETECTED

## 2023-10-16 ENCOUNTER — Telehealth: Payer: Self-pay | Admitting: Pharmacy Technician

## 2023-10-16 NOTE — Progress Notes (Signed)
   10/16/2023  Patient ID: Gregory Bryant, male   DOB: 22-Aug-1975, 48 y.o.   MRN: [id number]  Patient engaged with clinical pharmacist for management of diabetes on 10/06/2023. Outreach by [employer] technician was requested.   Outreached patient to discuss diabetes medication management. Left voicemail for patient to return my call at their convenience.   Bodee Lafoe, CPhT [facility] Office: [phone] Email: [email or web address]

## 2023-10-19 ENCOUNTER — Telehealth: Payer: Self-pay | Admitting: Pharmacy Technician

## 2023-10-19 NOTE — Progress Notes (Signed)
   10/19/2023 Name: Gregory Bryant MRN: [id number] DOB: 1975/07/10  Patient is appearing on a report for [employer] [facility] and last engaged with the clinical pharmacist to discuss diabetes on 10/06/2023. Contacted patient today to discuss diabetes management and completed medication review.   Diabetes Plan from last clinical pharmacist appointment:  Consulted patient in room after PCP appointment. Patient had not been able to pick up Lantus  or CGM supplies as previously instructed. Collaborated with PCP to send orders to new pharmacy that could fill his prescriptions on his Medicaid insurance.   T2DM:  - Start insulin  glargine (Lantus ) 8 units daily - Stop glipizide  - Switch metformin  IR to XR 1000 mg BID - Pick up supplies for FL3+ CGM and bring to next appointment - Repeat fructosamine at follow-up  HTN:  - Increase lisinopril  to 10 mg daily - Repeat BMP at follow-up HLD:  - Start rosuvastatin  20 mg daily as previously prescribed.  See PCP note from 10/06/23 for additional details.  Medication Adherence Barriers Identified:  Patient made recommended medication changes per plan: No Patient informs he picked up Lantus  Solostar but has not started it yet. He informs he plans to start it after the 10/14 PharmD appointment as he is going to have FreeStyle set up at that appointment. He informs he is concerned blood sugar may go too low. Patient informs that he has a meter and appropriate supplies to check his blood sugar at home and work. Patient was informed PharmD wants him to go ahead and start Lantus  today. He informs he will start Lantus  today. He Also informs he stopped the Glipizide  and is now taking the XR Metformin  twice a day. He informs he also started Rosuvatatin and increased his Lisinopril  as ordered. Access issues with any new medication or testing device: No Patient piicked up the following medications/supplies on 10/11/2023:Rosuvastatin  20mg  for 90 tabs,  FreeStyle receiver, FreeStyle Sensors quantity of 6, Lantus  Solostar 1 box or 15ml, 90day supply of Lisinopril  10mg  and 90 day supply of Metformin  XR. He reported no cost concerns for these medications/supplies.. Patient is checking blood sugars as prescribed: Yes He informs he is checking blood sugars and his readings are between 157-180 which he admits is still high but better than the 200-2300s that he was having. Patient was informed to bring FreeStyle device and sensors to next appointment with PharmD. He has not been able to check Blood Pressure at home as he does not have a blood pressure cuff. Will inform PharmD.  Medication Adherence Barriers Addressed/Actions Taken:  Reviewed medication changes per plan from last clinical pharmacist note Medication Access for Blood Pressure Cuff to check blood pressure Will discuss medication access concerns with pharmacist Educated patient to contact pharmacy regarding new prescriptions/refills  Reviewed instructions for monitoring blood sugars at home and reminded patient to keep a written log to review with pharmacist Reminded patient of date/time of upcoming clinical pharmacist follow up and any upcoming PCP/specialists visits. Patient denies transportation barriers to the appointment. Yes  Next clinical pharmacist appointment is scheduled for: 11/10/2023  Jeanene Mena, CPhT [facility] Office: [phone] Email: [email or web address]

## 2023-11-10 ENCOUNTER — Telehealth: Payer: Self-pay

## 2023-11-10 ENCOUNTER — Ambulatory Visit: Payer: Self-pay

## 2023-11-10 NOTE — Progress Notes (Deleted)
 11/10/2023 Name: Takuya Lariccia MRN: [id number] DOB: 03/31/1975  No chief complaint on file.   Macallister Ashmead is a 48 y.o. year old male who was referred for medication management by their primary care provider, Paseda, Folashade R, FNP. They presented for a face to face visit today.   They were referred to the pharmacist by their PCP for assistance in managing diabetes and hyperlipidemia  PMH includes includes HTN, T2DM, sickle cell disease, cholecystectomy (2019), asthma.   Subjective: Patient was last seen by PCP, [name], NP on 06/24/23. His fructosamine was increased to 659 umol/L, which correlates to an A1C of 12.8%. He had not taken glipizide  in 2 mo due to not having refills at the pharmacy. He was only taking metformin  1000 mg BID. He denied s/sx of hyperglycemia. BP was 130/81 mmHg without taking lisinopril  that morning. He was prescribed rosuvastatin  5 mg daily for elevated LDL-direct 138 mg/dL. He had to return for repeat labs given an elevated WBC  Today, patient reports doing well. He is taking his medications, but reports his BG have still been consistently elevated > 200 mg/dL. He is not monitoring every day.    Care Team: Primary Care Provider: Paseda, Folashade R, FNP ; Next Scheduled Visit: 09/29/23  Medication Access/Adherence  Current Pharmacy:  [id number] [place], [place] - [address] [facility] [address] [place] [address] Phone: [phone] Fax: [phone]   Patient reports affordability concerns with their medications: No  Patient reports access/transportation concerns to their pharmacy: No  Patient reports adherence concerns with their medications:  Yes  - frequently misses second dose of metformin   Diabetes:  Current medications: glipizide  IR 10 mg daily with breakfast, metformin  IR 1000 mg BID (reports that he frequently forgets the second  dose) Medications tried in the past: N/A  Reports that he does have occasional diarrhea and nausea with metformin , which is better if eats with his dose. Would be interested in switching to XR version.  Current glucose readings:  Using Accu Chek meter; testing a couple times a week Brings a glucometer with him, but it is not the meter that he typically uses - 06/25/23: 4AM 300 mg/dL. Reports yesterday AM in the 200s.   Patient denies hypoglycemic s/sx including dizziness, shakiness, sweating. Patient reports hyperglycemic symptoms including occasional nocturia. Notes that he has been losing some weight. Denies polyuria, polydipsia, polyphagia, nocturia, neuropathy, blurred vision.  Current meal patterns: Only eats 2 meals/day (breakfast/dinner) - skips lunch because he is busy and does not get hungry.  - Breakfast: tomato sandwich this AM - Dinner: Salmon, brown rice (~ 1 cup)   Current physical activity: Walks a lot at work  Current medication access support: Medicaid insurance  Hyperlipidemia/ASCVD Risk Reduction  Current lipid lowering medications: rosuvastatin  5 mg daily (has not started)  ASCVD History: none Risk Factors: T2DM, HLD   Objective:  BP Readings from Last 3 Encounters:  10/06/23 (!) 141/72  06/24/23 130/81  03/25/23 119/75    Lab Results  Component Value Date   HGBA1C 5.5 07/30/2022   HGBA1C 5.5 11/05/2021   HGBA1C 5.6 08/25/2020   Fructosamine 06/24/23: 659 umol/L > ~A1C 12.8%     Latest Ref Rng & Units 06/30/2023    2:47 PM 06/24/2023   10:01 AM 03/25/2023    9:15 AM  BMP  Glucose 70 - 99 mg/dL 635  614  761   BUN 6 - 24  mg/dL 16  6  11    Creatinine 0.76 - 1.27 mg/dL 9.00  9.15  9.06   BUN/Creat Ratio 9 - 20 16  7  12    Sodium 134 - 144 mmol/L 132  132  134   Potassium 3.5 - 5.2 mmol/L 4.8  4.7  5.1   Chloride 96 - 106 mmol/L 97  95  100   CO2 20 - 29 mmol/L 20  19  20    Calcium  8.7 - 10.2 mg/dL 9.9  89.5  9.7     Lab Results  Component Value  Date   CHOL 186 03/25/2023   HDL 45 03/25/2023   LDLCALC 128 (H) 03/25/2023   LDLDIRECT 138 (H) 06/24/2023   TRIG 71 03/25/2023   CHOLHDL 4.1 03/25/2023    Medications Reviewed Today   Medications were not reviewed in this encounter      Assessment/Plan:   Diabetes: - Currently uncontrolled with most recent fructosamine of 659 umo/L, correlating with an A1C of 12.8% above goal < 7%. Patient is exhibiting s/sx of over hyperglycemia, therefore would prefer to initiate basal insulin  compared to a GLP-1RA. He also has been losing weight and does not currently have a large appetite. Patient has not been regularly monitoring BG and is on high dose glipizide . Will d/c glipizide  to reduce risk of hypoglycemia as we start insulin . Patient is willing to start monitoring his BG with a CGM. Will provide set up education at follow-up appt in ~2 weeks. - Last UACR Feb 2025: 20 mg/g - Reviewed long term cardiovascular and renal outcomes of uncontrolled blood sugar - Reviewed goal A1c, goal fasting, and goal 2 hour post prandial glucose - Reviewed hypoglycemia management plan and the rule of 15 - Reviewed dietary modifications including  utilizing the healthy plate method, limiting portion size of carbohydrate foods, increasing intake of protein and non-starchy vegetables. Counseled patient to stay hydrated with water throughout the day. - Reviewed lifestyle modifications including: aiming for 150 minutes of moderate intensity exercise every week.  - Recommend to start insulin  glargine (Lantus ) 8 units once daily - Recommend to stop glipizide  - Recommend to switch metformin  IR to XR 1000 mg BID.   - Recommend to check glucose twice daily: fasting and 2-hr PPG and pick up FL3+ CGM from pharmacy. Will collaborate with CPhT to complete PA for CGM. Counseled patient to bring glucometer or BG log to every appointment. - Next A1C due 09/24/23 (next PCP appt)     Hyperlipidemia/ASCVD Risk Reduction: -  Currently uncontrolled with most recent LDL-C of 138 mg/dL above goal < 70 mg/dL given U7IF + comorbidities. High intensity statin indicated. - Reviewed long term complications of uncontrolled cholesterol - Recommend to start rosuvastatin  20 mg daily    Follow Up Plan:  Pharmacist 09/08/23 PCP clinic visit 09/29/23   Lorain Baseman, PharmD [facility] [phone]

## 2023-11-10 NOTE — Telephone Encounter (Signed)
 Attempted to contact patient for scheduled appointment for medication management. Left HIPAA compliant message for patient to return my call at their convenience.   Lorain Baseman, PharmD [facility] [phone]

## 2023-11-11 ENCOUNTER — Telehealth: Payer: Self-pay | Admitting: Pharmacy Technician

## 2023-11-11 NOTE — Progress Notes (Signed)
   11/11/2023  Patient ID: Gregory Bryant, male   DOB: 1975/10/10, 48 y.o.   MRN: [id number]  Patient engaged with clinical pharmacist for management of diabetes on 10/06/2023. Outreach by [employer] technician was requested.   Outreached patient to discuss diabetes medication management. Left voicemail for patient to return my call at their convenience.    Teiana Hajduk, CPhT [facility] Office: [phone] Email: [email or web address]

## 2023-11-13 ENCOUNTER — Telehealth: Payer: Self-pay | Admitting: Pharmacy Technician

## 2023-11-13 NOTE — Progress Notes (Signed)
   11/13/2023  Patient ID: Gregory Bryant, male   DOB: Jun 29, 1975, 48 y.o.   MRN: [id number]  Patient engaged with clinical pharmacist for management of diabetes on 10/06/2023. Outreach by [employer] technician was requested.   Outreached patient to discuss diabetes medication management. Left voicemail for patient to return my call at their convenience.   Levaughn Puccinelli, CPhT  [facility] Office: [phone] Email: [email or web address]

## 2023-11-16 ENCOUNTER — Telehealth: Payer: Self-pay | Admitting: Pharmacy Technician

## 2023-11-16 NOTE — Progress Notes (Signed)
   11/16/2023  Patient ID: Gregory Bryant, male   DOB: 1975/03/18, 48 y.o.   MRN: [id number]  Patient engaged with clinical pharmacist for management of diabetes on 10/06/2023. Outreach by [employer] technician was requested.   Outreached patient to discuss diabetes medication management. Left voicemail for patient to return my call at their convenience. MyChart message sent to patient today.  Anecia Nusbaum, CPhT [facility] Office: [phone] Email: [email or web address]

## 2023-11-25 DIAGNOSIS — D5720 Sickle-cell/Hb-C disease without crisis: Secondary | ICD-10-CM | POA: Diagnosis not present

## 2023-12-07 ENCOUNTER — Telehealth: Payer: Self-pay | Admitting: Nurse Practitioner
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
## 2023-12-07 NOTE — Telephone Encounter (Unsigned)
 Copied from CRM [id number]. Topic: Clinical - Medication Refill >> Dec 07, 2023  4:06 PM Gregory Bryant wrote: Medication: Oxycodone  HCl 10 MG TABS  Has the patient contacted their pharmacy? No   This is the patient's preferred pharmacy:  [id number] [place],  - [address] [facility] [address] [place] [address] Phone: [phone] Fax: [phone]  Is this the correct pharmacy for this prescription? Yes If no, delete pharmacy and type the correct one.   Has the prescription been filled recently? Yes  Is the patient out of the medication? Yes  Has the patient been seen for an appointment in the last year OR does the patient have an upcoming appointment? Yes  Can we respond through MyChart? Yes  Agent: Please be advised that Rx refills may take up to 3 business days. We ask that you follow-up with your pharmacy.

## 2023-12-08 ENCOUNTER — Other Ambulatory Visit: Payer: Self-pay | Admitting: Nurse Practitioner

## 2023-12-08 DIAGNOSIS — D5700 Hb-SS disease with crisis, unspecified: Secondary | ICD-10-CM

## 2023-12-08 DIAGNOSIS — M7918 Myalgia, other site: Secondary | ICD-10-CM

## 2023-12-08 MED ORDER — OXYCODONE HCL 10 MG PO TABS: 10.0000 mg | ORAL_TABLET | ORAL | 0 refills | Status: AC | PRN

## 2023-12-08 NOTE — Telephone Encounter (Signed)
 Pt was lvm to advise [facility]

## 2023-12-25 ENCOUNTER — Other Ambulatory Visit: Payer: Self-pay

## 2023-12-25 ENCOUNTER — Emergency Department (HOSPITAL_BASED_OUTPATIENT_CLINIC_OR_DEPARTMENT_OTHER)

## 2023-12-25 ENCOUNTER — Emergency Department (HOSPITAL_BASED_OUTPATIENT_CLINIC_OR_DEPARTMENT_OTHER)
Admission: EM | Admit: 2023-12-25 | Discharge: 2023-12-25 | Disposition: A | Discharge status: Home / Self Care | Attending: Emergency Medicine | Admitting: Emergency Medicine

## 2023-12-25 ENCOUNTER — Encounter (HOSPITAL_BASED_OUTPATIENT_CLINIC_OR_DEPARTMENT_OTHER): Payer: Self-pay

## 2023-12-25 DIAGNOSIS — Z794 Long term (current) use of insulin: Secondary | ICD-10-CM | POA: Diagnosis not present

## 2023-12-25 DIAGNOSIS — M25552 Pain in left hip: Secondary | ICD-10-CM | POA: Insufficient documentation

## 2023-12-25 DIAGNOSIS — Y92410 Unspecified street and highway as the place of occurrence of the external cause: Secondary | ICD-10-CM | POA: Insufficient documentation

## 2023-12-25 DIAGNOSIS — Z79899 Other long term (current) drug therapy: Secondary | ICD-10-CM | POA: Insufficient documentation

## 2023-12-25 DIAGNOSIS — Z7984 Long term (current) use of oral hypoglycemic drugs: Secondary | ICD-10-CM | POA: Insufficient documentation

## 2023-12-25 DIAGNOSIS — M545 Low back pain, unspecified: Secondary | ICD-10-CM | POA: Diagnosis not present

## 2023-12-25 DIAGNOSIS — R102 Pelvic and perineal pain unspecified side: Secondary | ICD-10-CM | POA: Insufficient documentation

## 2023-12-25 MED ORDER — CYCLOBENZAPRINE HCL 10 MG PO TABS: 10.0000 mg | ORAL_TABLET | Freq: Two times a day (BID) | ORAL | 0 refills | Status: AC | PRN
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
## 2024-01-05 ENCOUNTER — Ambulatory Visit: Payer: Self-pay | Admitting: Nurse Practitioner

## 2024-01-06 ENCOUNTER — Ambulatory Visit: Payer: Self-pay | Admitting: Nurse Practitioner

## 2024-02-16 ENCOUNTER — Encounter: Payer: Self-pay | Admitting: Nurse Practitioner

## 2024-02-16 ENCOUNTER — Ambulatory Visit: Payer: Self-pay | Admitting: Nurse Practitioner

## 2024-02-16 VITALS — BP 127/87 | HR 80 | Wt 168.0 lb

## 2024-02-16 DIAGNOSIS — E559 Vitamin D deficiency, unspecified: Secondary | ICD-10-CM

## 2024-02-16 DIAGNOSIS — E785 Hyperlipidemia, unspecified: Secondary | ICD-10-CM

## 2024-02-16 DIAGNOSIS — D5720 Sickle-cell/Hb-C disease without crisis: Secondary | ICD-10-CM

## 2024-02-16 DIAGNOSIS — Z23 Encounter for immunization: Secondary | ICD-10-CM | POA: Diagnosis not present

## 2024-02-16 DIAGNOSIS — I10 Essential (primary) hypertension: Secondary | ICD-10-CM

## 2024-02-16 DIAGNOSIS — E1165 Type 2 diabetes mellitus with hyperglycemia: Secondary | ICD-10-CM

## 2024-02-16 MED ORDER — VITAMIN D (ERGOCALCIFEROL) 1.25 MG (50000 UNIT) PO CAPS: 50000.0000 [IU] | ORAL_CAPSULE | ORAL | 3 refills | Status: AC

## 2024-02-16 MED ORDER — FOLIC ACID 1 MG PO TABS: 1.0000 mg | ORAL_TABLET | Freq: Every day | ORAL | 2 refills | Status: AC

## 2024-02-16 MED ORDER — IBUPROFEN 600 MG PO TABS: 600.0000 mg | ORAL_TABLET | Freq: Three times a day (TID) | ORAL | 5 refills | Status: AC | PRN

## 2024-02-16 MED ORDER — LISINOPRIL 20 MG PO TABS: 20.0000 mg | ORAL_TABLET | Freq: Every day | ORAL | 1 refills | Status: AC

## 2024-02-16 NOTE — Assessment & Plan Note (Signed)
 Uncontrolled type 2 diabetes mellitus with hyperglycemia Blood glucose levels elevated, fasting around 227 mg/dL. Insulin  not used as prescribed. Needs education on carbohydrate management. - Encouraged starting  Lantus  8 units daily as prescribed.  Continue metformin  1000 mg twice daily - Instructed to bring Freestyle device to next appointment for setup assistance. - Recommended attending a nutrition class for dietary education.-Nutritionist contact information provided - Advised on dietary modifications: avoid sugar drinks, limit carbohydrates, focus on vegetables and protein. - Scheduled follow-up in 3 months  to assess medication adherence and blood glucose control. Appreciate collaboration with the clinical pharmacist appointment scheduled with Layna today

## 2024-02-16 NOTE — Assessment & Plan Note (Signed)
 We discussed the need for good hydration, monitoring of hydration status, avoidance of heat, cold, stress, and infection triggers. We discussed the need to be adherent with taking folic acid  and other home medications. Patient was reminded of the need to seek medical attention immediately if any symptom of bleeding, anemia, or infection occurs.   Continue ibuprofen  800 mg 3 times daily as needed mild pain, oxycodone  10mg , 1 tablet every 4 hours as needed for moderate pain I reminded the patient that all patients receiving Schedule II narcotics must be seen for follow within the 3 months in the office.  We reviewed the terms of our pain agreement, including the need to keep medicines in a safe locked location away from children or pets, and the need to report excess sedation or constipation, measures to avoid constipation, and policies related to early refills and stolen prescriptions.

## 2024-02-16 NOTE — Progress Notes (Signed)
 "  Established Patient Office Visit  Subjective:  Patient ID: Gregory Bryant, male    DOB: 01/05/1976  Age: 49 y.o. MRN: [id number]  CC:  Chief Complaint  Patient presents with   Sickle Cell Anemia    HPI   Discussed the use of AI scribe software for clinical note transcription with the patient, who gave verbal consent to proceed.  History of Present Illness Gregory Bryant is a 49 year old male  has a past medical history of Avascular necrosis of humeral head (HCC) (03/30/2017), Diabetes mellitus without complication (HCC), Dyslipidemia, goal LDL below 70 (06/24/2023), Essential hypertension (03/25/2023), and Sickle cell anemia (HCC).  who presents for medication management and follow-up.  He takes lisinopril  10 mg daily, metformin  1000 mg twice daily, and rosuvastatin  20 mg daily. He is no longer taking hydroxyurea and has been unable to obtain vitamin D  due to a prescription issue. He has not initiated insulin  therapy as he missed an appointment for setup assistance and requires help with the Freestyle device. His blood sugar levels have been elevated, with fasting levels around 227 mg/dL and postprandial levels reaching 229 mg/dL. He experiences thirst but denies excessive urination or pain.  Uses a glucose meter to monitor blood sugar at home  He is actively working on his diet by avoiding salt and sugar and opting for water instead of sugary drinks. Despite a referral in May, he has not attended a nutrition class. He engages in physical activity through outdoor work, including cutting trees and dragging brush.  For pain management, he takes oxycodone  10 mg every four hours as needed and ibuprofen  600 mg every eight hours as needed. He last used oxycodone  in December and reports a past episode of constipation.  No fever, chills, chest pain, shortness of breath, nausea, or vomiting. He has not checked his blood pressure at home recently.  Assessment &  Plan     Past Medical History:  Diagnosis Date   Avascular necrosis of humeral head (HCC) 03/30/2017   Diabetes mellitus without complication (HCC)    Dyslipidemia, goal LDL below 70 06/24/2023   Essential hypertension 03/25/2023   Sickle cell anemia (HCC)     Past Surgical History:  Procedure Laterality Date   CHOLECYSTECTOMY      Family History  Problem Relation Age of Onset   Diabetes Mother     Social History   Socioeconomic History   Marital status: Married    Spouse name: Not on file   Number of children: 2   Years of education: Not on file   Highest education level: Not on file  Occupational History   Not on file  Tobacco Use   Smoking status: Never   Smokeless tobacco: Never  Vaping Use   Vaping status: Never Used  Substance and Sexual Activity   Alcohol use: No   Drug use: No   Sexual activity: Not Currently  Other Topics Concern   Not on file  Social History Narrative   Lives with his wife    Social Drivers of Health   Tobacco Use: Low Risk (02/16/2024)   Patient History    Smoking Tobacco Use: Never    Smokeless Tobacco Use: Never    Passive Exposure: Not on file  Financial Resource Strain: Medium Risk (07/09/2022)   Overall Financial Resource Strain (CARDIA)    Difficulty of Paying Living Expenses: Somewhat hard  Food Insecurity: No Food Insecurity (02/16/2024)   Epic    Worried About Running Out  of Food in the Last Year: Never true    Ran Out of Food in the Last Year: Never true  Transportation Needs: No Transportation Needs (02/16/2024)   Epic    Lack of Transportation (Medical): No    Lack of Transportation (Non-Medical): No  Physical Activity: Sufficiently Active (07/09/2022)   Exercise Vital Sign    Days of Exercise per Week: 7 days    Minutes of Exercise per Session: 150+ min  Stress: No Stress Concern Present (07/09/2022)   [employer] of Occupational Health - Occupational Stress Questionnaire    Feeling of Stress : Only a  little  Social Connections: Moderately Integrated (07/09/2022)   Social Connection and Isolation Panel    Frequency of Communication with Friends and Family: More than three times a week    Frequency of Social Gatherings with Friends and Family: Once a week    Attends Religious Services: More than 4 times per year    Active Member of Clubs or Organizations: Yes    Attends Banker Meetings: 1 to 4 times per year    Marital Status: Separated  Intimate Partner Violence: Not At Risk (02/16/2024)   Epic    Fear of Current or Ex-Partner: No    Emotionally Abused: No    Physically Abused: No    Sexually Abused: No  Depression (PHQ2-9): Low Risk (02/16/2024)   Depression (PHQ2-9)    PHQ-2 Score: 0  Alcohol Screen: Low Risk (07/09/2022)   Alcohol Screen    Last Alcohol Screening Score (AUDIT): 0  Housing: Low Risk (02/16/2024)   Epic    Unable to Pay for Housing in the Last Year: No    Number of Times Moved in the Last Year: 0    Homeless in the Last Year: No  Utilities: Not At Risk (02/16/2024)   Epic    Threatened with loss of utilities: No  Health Literacy: Not on file    Outpatient Medications Prior to Visit  Medication Sig Dispense Refill   Blood Glucose Monitoring Suppl DEVI 1 each by Does not apply route 2 (two) times daily. May substitute to any manufacturer covered by patient's insurance. 1 each 0   Glucose Blood (BLOOD GLUCOSE TEST STRIPS) STRP 1 each by In Vitro route 2 (two) times daily. May substitute to any manufacturer covered by patient's insurance. 200 strip 4   Insulin  Pen Needle (PEN NEEDLES) 32G X 4 MM MISC Use as instructed to inject insulin  once daily 100 each 3   magnesium oxide (MAG-OX) 400 MG tablet Take 400 mg by mouth daily.     metFORMIN  (GLUCOPHAGE -XR) 500 MG 24 hr tablet Take 2 tablets (1,000 mg total) by mouth 2 (two) times daily with a meal. 360 tablet 3   ondansetron  (ZOFRAN  ODT) 8 MG disintegrating tablet Take 1 tablet (8 mg total) by mouth every  8 (eight) hours as needed for nausea or vomiting. 20 tablet 0   Oxycodone  HCl 10 MG TABS Take 1 tablet (10 mg total) by mouth every 4 (four) hours as needed. 60 tablet 0   rosuvastatin  (CRESTOR ) 20 MG tablet Take 1 tablet (20 mg total) by mouth daily. 90 tablet 3   folic acid  (FOLVITE ) 1 MG tablet Take 1 tablet (1 mg total) by mouth daily. 90 tablet 2   ibuprofen  (ADVIL ) 600 MG tablet Take 1 tablet (600 mg total) by mouth every 8 (eight) hours as needed for mild pain (pain score 1-3). 30 tablet 5   lisinopril  (ZESTRIL ) 10  MG tablet Take 1 tablet (10 mg total) by mouth daily. 90 tablet 1   Continuous Glucose Receiver (FREESTYLE LIBRE 3 READER) DEVI Use as directed to monitor blood sugar with freestyle libre 3 CGM 1 each 0   Continuous Glucose Sensor (FREESTYLE LIBRE 3 PLUS SENSOR) MISC Change sensor every 15 days. (Patient not taking: Reported on 02/16/2024) 6 each 3   cyclobenzaprine  (FLEXERIL ) 10 MG tablet Take 1 tablet (10 mg total) by mouth 2 (two) times daily as needed for up to 14 doses for muscle spasms. (Patient not taking: Reported on 02/16/2024) 14 tablet 0   insulin  glargine (LANTUS ) 100 UNIT/ML injection Inject 0.08 mLs (8 Units total) into the skin daily. May increase up to 20 units daily if instructed by your doctor. (Patient not taking: Reported on 02/16/2024) 15 mL 3   mupirocin  ointment (BACTROBAN ) 2 % Apply 1 Application topically 2 (two) times daily. (Patient not taking: Reported on 02/16/2024) 22 g 0   hydroxyurea (HYDREA) 500 MG capsule Take 500 mg by mouth daily. (Patient not taking: Reported on 02/16/2024)     Vitamin D , Ergocalciferol , (DRISDOL ) 1.25 MG (50000 UNIT) CAPS capsule Take 1 capsule (50,000 Units total) by mouth every 7 (seven) days. Mondays (Patient not taking: Reported on 02/16/2024) 12 capsule 3   No facility-administered medications prior to visit.    Allergies[1]  ROS Review of Systems  Constitutional:  Negative for appetite change, chills, fatigue and fever.   HENT:  Negative for congestion, postnasal drip, rhinorrhea and sneezing.   Respiratory:  Negative for cough, shortness of breath and wheezing.   Cardiovascular:  Negative for chest pain, palpitations and leg swelling.  Gastrointestinal:  Negative for abdominal pain, constipation, nausea and vomiting.  Genitourinary:  Negative for difficulty urinating, dysuria, flank pain and frequency.  Musculoskeletal:  Negative for arthralgias, back pain, joint swelling and myalgias.  Skin:  Negative for color change, pallor, rash and wound.  Neurological:  Negative for dizziness, facial asymmetry, weakness, numbness and headaches.  Psychiatric/Behavioral:  Negative for behavioral problems, confusion, self-injury and suicidal ideas.       Objective:    Physical Exam Vitals and nursing note reviewed.  Constitutional:      General: He is not in acute distress.    Appearance: Normal appearance. He is not ill-appearing, toxic-appearing or diaphoretic.  Eyes:     General: Scleral icterus present.        Right eye: No discharge.        Left eye: No discharge.     Extraocular Movements: Extraocular movements intact.  Cardiovascular:     Rate and Rhythm: Normal rate and regular rhythm.     Pulses: Normal pulses.     Heart sounds: Normal heart sounds. No murmur heard.    No friction rub. No gallop.  Pulmonary:     Effort: Pulmonary effort is normal. No respiratory distress.     Breath sounds: Normal breath sounds. No stridor. No wheezing, rhonchi or rales.  Chest:     Chest wall: No tenderness.  Abdominal:     General: There is no distension.     Palpations: Abdomen is soft.     Tenderness: There is no abdominal tenderness. There is no right CVA tenderness, left CVA tenderness or guarding.  Musculoskeletal:        General: No swelling, tenderness, deformity or signs of injury.     Right lower leg: No edema.     Left lower leg: No edema.  Skin:  General: Skin is warm and dry.     Capillary  Refill: Capillary refill takes less than 2 seconds.     Coloration: Skin is not jaundiced or pale.     Findings: No bruising, erythema or lesion.  Neurological:     Mental Status: He is alert and oriented to person, place, and time.     Motor: No weakness.     Coordination: Coordination normal.     Gait: Gait normal.  Psychiatric:        Mood and Affect: Mood normal.        Behavior: Behavior normal.        Thought Content: Thought content normal.        Judgment: Judgment normal.     BP 127/87   Pulse 80   Wt 168 lb (76.2 kg)   SpO2 99%   BMI 27.12 kg/m  Wt Readings from Last 3 Encounters:  02/16/24 168 lb (76.2 kg)  10/06/23 164 lb (74.4 kg)  06/24/23 158 lb (71.7 kg)    Lab Results  Component Value Date   TSH 1.217 07/30/2022   Lab Results  Component Value Date   WBC 19.1 (H) 06/30/2023   HGB 12.4 (L) 06/30/2023   HCT 37.6 06/30/2023   MCV 87 06/30/2023   PLT 428 06/30/2023   Lab Results  Component Value Date   NA 132 (L) 06/30/2023   K 4.8 06/30/2023   CO2 20 06/30/2023   GLUCOSE 364 (H) 06/30/2023   BUN 16 06/30/2023   CREATININE 0.99 06/30/2023   BILITOT 3.1 (H) 06/24/2023   ALKPHOS 122 (H) 06/24/2023   AST 24 06/24/2023   ALT 18 06/24/2023   PROT 8.2 06/24/2023   ALBUMIN 4.5 06/24/2023   CALCIUM  9.9 06/30/2023   ANIONGAP 10 07/31/2022   EGFR 94 06/30/2023   Lab Results  Component Value Date   CHOL 186 03/25/2023   Lab Results  Component Value Date   HDL 45 03/25/2023   Lab Results  Component Value Date   LDLCALC 128 (H) 03/25/2023   Lab Results  Component Value Date   TRIG 71 03/25/2023   Lab Results  Component Value Date   CHOLHDL 4.1 03/25/2023   Lab Results  Component Value Date   HGBA1C 5.5 07/30/2022      Assessment & Plan:   Problem List Items Addressed This Visit       Cardiovascular and Mediastinum   Essential hypertension        02/16/2024    2:34 PM 02/16/2024    1:59 PM 12/25/2023    5:41 PM 10/06/2023    10:38 AM 06/24/2023    9:11 AM 06/24/2023    9:03 AM 03/25/2023    8:36 AM  BP/Weight  Systolic BP 127 125 145 141 130 140 119  Diastolic BP 87 86 85 72 81 82 75  Wt. (Lbs)  168  164  158 164  BMI  27.12 kg/m2  26.47 kg/m2  25.5 kg/m2 26.47 kg/m2  Essential hypertension Blood pressure 127/87 mmHg, diastolic slightly elevated. On lisinopril  10 mg daily. - Increased lisinopril  to 20 mg daily. - Encouraged heart-healthy, low-salt, low-fat diet. - Recommended moderate exercise, 30 minutes, 5 days a week.       Relevant Medications   lisinopril  (ZESTRIL ) 20 MG tablet     Endocrine   Uncontrolled type 2 diabetes mellitus with hyperglycemia (HCC) - Primary   Uncontrolled type 2 diabetes mellitus with hyperglycemia Blood glucose levels elevated, fasting around  227 mg/dL. Insulin  not used as prescribed. Needs education on carbohydrate management. - Encouraged starting  Lantus  8 units daily as prescribed.  Continue metformin  1000 mg twice daily - Instructed to bring Freestyle device to next appointment for setup assistance. - Recommended attending a nutrition class for dietary education.-Nutritionist contact information provided - Advised on dietary modifications: avoid sugar drinks, limit carbohydrates, focus on vegetables and protein. - Scheduled follow-up in 3 months  to assess medication adherence and blood glucose control. Appreciate collaboration with the clinical pharmacist appointment scheduled with Layna today        Relevant Medications   lisinopril  (ZESTRIL ) 20 MG tablet   Other Relevant Orders   Fructosamine   Direct LDL   Microalbumin / creatinine urine ratio     Other   Sickle cell disease, type  (HCC)   Relevant Medications   folic acid  (FOLVITE ) 1 MG tablet   Other Relevant Orders   Sickle Cell Panel   ToxAssure Flex 15, Ur   Vitamin D  deficiency    2 mo ago   Vitamin D  25-Hydroxy 30.0 - 100.0 ng/mL 35.5    Reports running out of vitamin D  due to pharmacy  issues. - Ensured vitamin D  prescription was sent to pharmacy.       Relevant Medications   Vitamin D , Ergocalciferol , (DRISDOL ) 1.25 MG (50000 UNIT) CAPS capsule   Dyslipidemia, goal LDL below 70   Lab Results  Component Value Date   CHOL 186 03/25/2023   HDL 45 03/25/2023   LDLCALC 128 (H) 03/25/2023   LDLDIRECT 138 (H) 06/24/2023   TRIG 71 03/25/2023   CHOLHDL 4.1 03/25/2023   Dyslipidemia Managed with rosuvastatin  20 mg daily. Checking direct LDL      Relevant Medications   lisinopril  (ZESTRIL ) 20 MG tablet   Need for influenza vaccination   Relevant Orders   Flu vaccine trivalent PF, 6mos and older(Flulaval,Afluria,Fluarix,Fluzone) (Completed)    Meds ordered this encounter  Medications   lisinopril  (ZESTRIL ) 20 MG tablet    Sig: Take 1 tablet (20 mg total) by mouth daily.    Dispense:  90 tablet    Refill:  1   folic acid  (FOLVITE ) 1 MG tablet    Sig: Take 1 tablet (1 mg total) by mouth daily.    Dispense:  90 tablet    Refill:  2    This prescription was filled on 04/24/2021. Any refills authorized will be placed on file.   Vitamin D , Ergocalciferol , (DRISDOL ) 1.25 MG (50000 UNIT) CAPS capsule    Sig: Take 1 capsule (50,000 Units total) by mouth every 7 (seven) days. Mondays    Dispense:  12 capsule    Refill:  3   ibuprofen  (ADVIL ) 600 MG tablet    Sig: Take 1 tablet (600 mg total) by mouth every 8 (eight) hours as needed for mild pain (pain score 1-3).    Dispense:  30 tablet    Refill:  5    Follow-up: Return in about 3 months (around 05/16/2024) for CPE.    Honest Safranek R Orlander [place]wood, FNP     [1]  Allergies Allergen Reactions   Aspirin Other (See Comments)    unknown   Sulfa Antibiotics Other (See Comments)    unknown   "

## 2024-02-16 NOTE — Patient Instructions (Signed)
 Goal for fasting blood sugar ranges from 80 to 120 and 2 hours after any meal or at bedtime should be between 130 to 170.   1. Uncontrolled type 2 diabetes mellitus with hyperglycemia (HCC) (Primary) - Fructosamine - Direct LDL - Microalbumin / creatinine urine ratio  2. Vitamin D  deficiency  3. Dyslipidemia, goal LDL below 70  4. Sickle cell-hemoglobin C disease without crisis (HCC) - Sickle Cell Panel - ToxAssure Flex 15, Ur  5. Need for influenza vaccination  6. Essential hypertension - lisinopril  (ZESTRIL ) 20 MG tablet; Take 1 tablet (20 mg total) by mouth daily.  Dispense: 90 tablet; Refill: 1    It is important that you exercise regularly at least 30 minutes 5 times a week as tolerated  Think about what you will eat, plan ahead. Choose  clean, green, fresh or frozen over canned, processed or packaged foods which are more sugary, salty and fatty. 70 to 75% of food eaten should be vegetables and fruit. Three meals at set times with snacks allowed between meals, but they must be fruit or vegetables. Aim to eat over a 12 hour period , example 7 am to 7 pm, and STOP after  your last meal of the day. Drink water,generally about 64 ounces per day, no other drink is as healthy. Fruit juice is best enjoyed in a healthy way, by EATING the fruit.  Thanks for choosing [facility] we consider it a privelige to serve you.

## 2024-02-16 NOTE — Assessment & Plan Note (Addendum)
" °  2 mo ago   Vitamin D  25-Hydroxy 30.0 - 100.0 ng/mL 35.5    Reports running out of vitamin D  due to pharmacy issues. - Ensured vitamin D  prescription was sent to pharmacy.  "

## 2024-02-16 NOTE — Assessment & Plan Note (Signed)
" ° ° °    02/16/2024    2:34 PM 02/16/2024    1:59 PM 12/25/2023    5:41 PM 10/06/2023   10:38 AM 06/24/2023    9:11 AM 06/24/2023    9:03 AM 03/25/2023    8:36 AM  BP/Weight  Systolic BP 127 125 145 141 130 140 119  Diastolic BP 87 86 85 72 81 82 75  Wt. (Lbs)  168  164  158 164  BMI  27.12 kg/m2  26.47 kg/m2  25.5 kg/m2 26.47 kg/m2  Essential hypertension Blood pressure 127/87 mmHg, diastolic slightly elevated. On lisinopril  10 mg daily. - Increased lisinopril  to 20 mg daily. - Encouraged heart-healthy, low-salt, low-fat diet. - Recommended moderate exercise, 30 minutes, 5 days a week.  "

## 2024-02-16 NOTE — Assessment & Plan Note (Addendum)
 Lab Results  Component Value Date   CHOL 186 03/25/2023   HDL 45 03/25/2023   LDLCALC 128 (H) 03/25/2023   LDLDIRECT 138 (H) 06/24/2023   TRIG 71 03/25/2023   CHOLHDL 4.1 03/25/2023   Dyslipidemia Managed with rosuvastatin  20 mg daily. Checking direct LDL

## 2024-02-17 LAB — MICROALBUMIN / CREATININE URINE RATIO
Creatinine, Urine: 88.9 mg/dL
Microalb/Creat Ratio: 147 mg/g{creat} — ABNORMAL HIGH (ref 0–29)
Microalbumin, Urine: 130.8 ug/mL

## 2024-02-17 LAB — CMP14+CBC/D/PLT+FER+RETIC+V...

## 2024-02-18 LAB — CMP14+CBC/D/PLT+FER+RETIC+V...
ALT: 19 IU/L (ref 0–44)
AST: 23 IU/L (ref 0–40)
Albumin: 4.7 g/dL (ref 4.1–5.1)
Alkaline Phosphatase: 117 IU/L (ref 47–123)
BUN/Creatinine Ratio: 10 (ref 9–20)
BUN: 10 mg/dL (ref 6–24)
Basos: 1 %
Bilirubin Total: 3.8 mg/dL — ABNORMAL HIGH (ref 0.0–1.2)
CO2: 20 mmol/L (ref 20–29)
Calcium: 10.5 mg/dL — ABNORMAL HIGH (ref 8.7–10.2)
Chloride: 101 mmol/L (ref 96–106)
Creatinine, Ser: 0.99 mg/dL (ref 0.76–1.27)
EOS (ABSOLUTE): 0.2 x10E3/uL (ref 0.0–0.2)
Eos: 1 %
Ferritin: 920 ng/mL — ABNORMAL HIGH (ref 30–400)
Globulin, Total: 3.6 g/dL (ref 1.5–4.5)
Glucose: 237 mg/dL — ABNORMAL HIGH (ref 70–99)
Hematocrit: 41.6 % (ref 37.5–51.0)
Hemoglobin: 13.8 g/dL (ref 13.0–17.7)
Immature Granulocytes: 0.4 x10E3/uL — AB (ref 0.0–0.1)
Immature Granulocytes: 2 %
Lymphs: 20 %
MCH: 28.8 pg (ref 26.6–33.0)
MCHC: 33.2 g/dL (ref 31.5–35.7)
MCV: 87 fL (ref 79–97)
Monocytes Absolute: 0.3 x10E3/uL (ref 0.0–0.4)
Monocytes Absolute: 1.2 x10E3/uL — AB (ref 0.1–0.9)
Monocytes: 6 %
NRBC: 2 % — AB (ref 0–0)
Neutrophils Absolute: 15.4 x10E3/uL — AB (ref 1.4–7.0)
Neutrophils Absolute: 4.3 x10E3/uL — AB (ref 0.7–3.1)
Neutrophils: 70 %
Platelets: 465 x10E3/uL — AB (ref 150–450)
Potassium: 4.6 mmol/L (ref 3.5–5.2)
RBC: 4.8 x10E6/uL (ref 4.14–5.80)
RDW: 16 % — AB (ref 11.6–15.4)
Retic Ct Pct: 6.1 % — AB (ref 0.6–2.6)
Sodium: 137 mmol/L (ref 134–144)
Total Protein: 8.3 g/dL (ref 6.0–8.5)
Vit D, 25-Hydroxy: 25.6 ng/mL — ABNORMAL LOW (ref 30.0–100.0)
WBC: 21.7 x10E3/uL — AB (ref 3.4–10.8)
eGFR: 94 mL/min/1.73

## 2024-02-18 LAB — FRUCTOSAMINE: Fructosamine: 681 umol/L — ABNORMAL HIGH (ref 0–285)

## 2024-02-18 LAB — LDL CHOLESTEROL, DIRECT: LDL Direct: 132 mg/dL — ABNORMAL HIGH (ref 0–99)

## 2024-02-18 LAB — HEMOGLOBIN A1C: Hemoglobin A1C: 13.18

## 2024-02-19 ENCOUNTER — Ambulatory Visit: Payer: Self-pay | Admitting: Nurse Practitioner

## 2024-02-19 DIAGNOSIS — E785 Hyperlipidemia, unspecified: Secondary | ICD-10-CM

## 2024-02-19 MED ORDER — ROSUVASTATIN CALCIUM 40 MG PO TABS: 40.0000 mg | ORAL_TABLET | Freq: Every day | ORAL | 3 refills | Status: AC

## 2024-02-22 ENCOUNTER — Telehealth: Payer: Self-pay

## 2024-02-22 LAB — TOXASSURE FLEX 15, UR
6-ACETYLMORPHINE IA: NEGATIVE ng/mL
7-aminoclonazepam: NOT DETECTED ng/mg{creat}
AMPHETAMINES IA: NEGATIVE ng/mL
Alpha-hydroxyalprazolam: NOT DETECTED ng/mg{creat}
Alpha-hydroxymidazolam: NOT DETECTED ng/mg{creat}
Alpha-hydroxytriazolam: NOT DETECTED ng/mg{creat}
Alprazolam: NOT DETECTED ng/mg{creat}
BARBITURATES IA: NEGATIVE ng/mL
BUPRENORPHINE: NEGATIVE
Benzodiazepines: NEGATIVE
Buprenorphine: NOT DETECTED ng/mg{creat}
CANNABINOIDS IA: NEGATIVE ng/mL
COCAINE METABOLITE IA: NEGATIVE ng/mL
Clonazepam: NOT DETECTED ng/mg{creat}
Creatinine: 83 mg/dL
Desalkylflurazepam: NOT DETECTED ng/mg{creat}
Desmethyldiazepam: NOT DETECTED ng/mg{creat}
Desmethylflunitrazepam: NOT DETECTED ng/mg{creat}
Diazepam: NOT DETECTED ng/mg{creat}
ETHYL ALCOHOL Enzymatic: NEGATIVE g/dL
FENTANYL: NEGATIVE
Fentanyl: NOT DETECTED ng/mg{creat}
Flunitrazepam: NOT DETECTED ng/mg{creat}
Lorazepam: NOT DETECTED ng/mg{creat}
METHADONE IA: NEGATIVE ng/mL
METHADONE MTB IA: NEGATIVE ng/mL
Midazolam: NOT DETECTED ng/mg{creat}
Norbuprenorphine: NOT DETECTED ng/mg{creat}
Norfentanyl: NOT DETECTED ng/mg{creat}
OPIATE CLASS IA: NEGATIVE ng/mL
OXYCODONE CLASS IA: NEGATIVE ng/mL
Oxazepam: NOT DETECTED ng/mg{creat}
PHENCYCLIDINE IA: NEGATIVE ng/mL
TAPENTADOL, IA: NEGATIVE ng/mL
TRAMADOL IA: NEGATIVE ng/mL
Temazepam: NOT DETECTED ng/mg{creat}

## 2024-02-22 NOTE — Telephone Encounter (Signed)
 During patient's PCP visit on 02/16/24, assisted patient in setting up [employer] account. Also demonstrated CGM application and set up using [employer] device. Patient reported he would apply sensor once he got home. Reviewed Lib[employer]View today, which does not show that patient successfully connected sensor.   Patient has pharmacy follow-up scheduled on 03/08/24.   Lorain Baseman, PharmD [facility] [phone]

## 2024-03-08 ENCOUNTER — Ambulatory Visit: Payer: Self-pay

## 2024-06-14 ENCOUNTER — Encounter: Payer: Self-pay | Admitting: Nurse Practitioner
